# Patient Record
Sex: Female | Born: 1935 | Race: White | Hispanic: No | Smoking: Never smoker
Health system: Southern US, Community
[De-identification: ages and names within clinical notes are randomized; demographics above are authoritative.]

## PROBLEM LIST (undated history)

## (undated) DIAGNOSIS — E119 Type 2 diabetes mellitus without complications: Secondary | ICD-10-CM

## (undated) DIAGNOSIS — M810 Age-related osteoporosis without current pathological fracture: Secondary | ICD-10-CM

## (undated) DIAGNOSIS — R42 Dizziness and giddiness: Secondary | ICD-10-CM

## (undated) DIAGNOSIS — M109 Gout, unspecified: Secondary | ICD-10-CM

## (undated) DIAGNOSIS — D696 Thrombocytopenia, unspecified: Secondary | ICD-10-CM

## (undated) DIAGNOSIS — S42213A Unspecified displaced fracture of surgical neck of unspecified humerus, initial encounter for closed fracture: Secondary | ICD-10-CM

## (undated) DIAGNOSIS — N183 Chronic kidney disease, stage 3 unspecified: Secondary | ICD-10-CM

## (undated) DIAGNOSIS — M47816 Spondylosis without myelopathy or radiculopathy, lumbar region: Secondary | ICD-10-CM

## (undated) DIAGNOSIS — G4730 Sleep apnea, unspecified: Secondary | ICD-10-CM

## (undated) DIAGNOSIS — M1990 Unspecified osteoarthritis, unspecified site: Secondary | ICD-10-CM

## (undated) DIAGNOSIS — G609 Hereditary and idiopathic neuropathy, unspecified: Secondary | ICD-10-CM

## (undated) DIAGNOSIS — E039 Hypothyroidism, unspecified: Secondary | ICD-10-CM

## (undated) DIAGNOSIS — K219 Gastro-esophageal reflux disease without esophagitis: Secondary | ICD-10-CM

## (undated) DIAGNOSIS — I10 Essential (primary) hypertension: Secondary | ICD-10-CM

## (undated) DIAGNOSIS — S02401A Maxillary fracture, unspecified, initial encounter for closed fracture: Secondary | ICD-10-CM

## (undated) DIAGNOSIS — I8390 Asymptomatic varicose veins of unspecified lower extremity: Secondary | ICD-10-CM

## (undated) DIAGNOSIS — E785 Hyperlipidemia, unspecified: Secondary | ICD-10-CM

## (undated) DIAGNOSIS — Z8679 Personal history of other diseases of the circulatory system: Secondary | ICD-10-CM

## (undated) DIAGNOSIS — C50919 Malignant neoplasm of unspecified site of unspecified female breast: Secondary | ICD-10-CM

## (undated) HISTORY — PX: BACK SURGERY: SHX140

## (undated) HISTORY — DX: Chronic kidney disease, stage 3 unspecified: N18.30

## (undated) HISTORY — DX: Unspecified displaced fracture of surgical neck of unspecified humerus, initial encounter for closed fracture: S42.213A

## (undated) HISTORY — DX: Thrombocytopenia, unspecified: D69.6

## (undated) HISTORY — DX: Malignant neoplasm of unspecified site of unspecified female breast: C50.919

## (undated) HISTORY — PX: HERNIA REPAIR: SHX51

## (undated) HISTORY — DX: Personal history of other diseases of the circulatory system: Z86.79

## (undated) HISTORY — PX: DILATION AND CURETTAGE OF UTERUS: SHX78

## (undated) HISTORY — DX: Hyperlipidemia, unspecified: E78.5

## (undated) HISTORY — DX: Hereditary and idiopathic neuropathy, unspecified: G60.9

## (undated) HISTORY — DX: Gout, unspecified: M10.9

## (undated) HISTORY — DX: Chronic kidney disease, stage 3 (moderate): N18.3

## (undated) HISTORY — DX: Hypothyroidism, unspecified: E03.9

## (undated) HISTORY — PX: OTHER SURGICAL HISTORY: SHX169

## (undated) HISTORY — DX: Spondylosis without myelopathy or radiculopathy, lumbar region: M47.816

## (undated) HISTORY — DX: Asymptomatic varicose veins of unspecified lower extremity: I83.90

## (undated) HISTORY — DX: Age-related osteoporosis without current pathological fracture: M81.0

## (undated) HISTORY — PX: VEIN LIGATION AND STRIPPING: SHX2653

## (undated) HISTORY — DX: Maxillary fracture, unspecified side, initial encounter for closed fracture: S02.401A

---
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
## 1987-01-15 HISTORY — PX: HIP FRACTURE SURGERY: SHX118

## 1988-12-14 HISTORY — PX: TOTAL HIP ARTHROPLASTY: SHX124

## 1998-07-11 ENCOUNTER — Other Ambulatory Visit: Admission: RE | Admit: 1998-07-11 | Discharge: 1998-07-11 | Payer: Self-pay | Admitting: Obstetrics and Gynecology

## 1999-08-21 ENCOUNTER — Other Ambulatory Visit: Admission: RE | Admit: 1999-08-21 | Discharge: 1999-08-21 | Payer: Self-pay | Admitting: Obstetrics and Gynecology

## 2000-08-21 ENCOUNTER — Other Ambulatory Visit: Admission: RE | Admit: 2000-08-21 | Discharge: 2000-08-21 | Payer: Self-pay | Admitting: Obstetrics and Gynecology

## 2002-01-20 ENCOUNTER — Ambulatory Visit (HOSPITAL_COMMUNITY): Admission: RE | Admit: 2002-01-20 | Discharge: 2002-01-20 | Payer: Self-pay | Admitting: Gastroenterology

## 2005-11-25 ENCOUNTER — Ambulatory Visit (HOSPITAL_COMMUNITY): Admission: RE | Admit: 2005-11-25 | Discharge: 2005-11-25 | Payer: Self-pay | Admitting: Obstetrics and Gynecology

## 2005-11-25 ENCOUNTER — Encounter (INDEPENDENT_AMBULATORY_CARE_PROVIDER_SITE_OTHER): Payer: Self-pay | Admitting: Specialist

## 2005-12-31 ENCOUNTER — Inpatient Hospital Stay (HOSPITAL_COMMUNITY): Admission: RE | Admit: 2005-12-31 | Discharge: 2006-01-02 | Payer: Self-pay | Admitting: Obstetrics and Gynecology

## 2005-12-31 ENCOUNTER — Encounter (INDEPENDENT_AMBULATORY_CARE_PROVIDER_SITE_OTHER): Payer: Self-pay | Admitting: *Deleted

## 2006-12-03 ENCOUNTER — Encounter: Admission: RE | Admit: 2006-12-03 | Discharge: 2006-12-03 | Payer: Self-pay | Admitting: Cardiology

## 2007-03-20 ENCOUNTER — Encounter: Admission: RE | Admit: 2007-03-20 | Discharge: 2007-03-20 | Payer: Self-pay | Admitting: Family Medicine

## 2007-03-30 HISTORY — PX: COLONOSCOPY W/ POLYPECTOMY: SHX1380

## 2008-03-21 ENCOUNTER — Encounter: Admission: RE | Admit: 2008-03-21 | Discharge: 2008-03-21 | Payer: Self-pay | Admitting: Obstetrics and Gynecology

## 2008-08-10 ENCOUNTER — Encounter: Admission: RE | Admit: 2008-08-10 | Discharge: 2008-08-10 | Payer: Self-pay | Admitting: Sports Medicine

## 2009-05-01 ENCOUNTER — Encounter: Admission: RE | Admit: 2009-05-01 | Discharge: 2009-05-01 | Payer: Self-pay | Admitting: Obstetrics and Gynecology

## 2009-09-13 ENCOUNTER — Encounter: Admission: RE | Admit: 2009-09-13 | Discharge: 2009-09-13 | Payer: Self-pay | Admitting: Gastroenterology

## 2009-09-13 ENCOUNTER — Ambulatory Visit: Payer: Self-pay | Admitting: Internal Medicine

## 2009-09-25 LAB — COMPREHENSIVE METABOLIC PANEL
ALT: 16 U/L (ref 0–35)
AST: 21 U/L (ref 0–37)
Albumin: 4.6 g/dL (ref 3.5–5.2)
Alkaline Phosphatase: 21 U/L — ABNORMAL LOW (ref 39–117)
BUN: 18 mg/dL (ref 6–23)
CO2: 30 mEq/L (ref 19–32)
Potassium: 3.5 mEq/L (ref 3.5–5.3)
Sodium: 142 mEq/L (ref 135–145)

## 2009-09-25 LAB — CBC WITH DIFFERENTIAL/PLATELET
Basophils Absolute: 0 10*3/uL (ref 0.0–0.1)
EOS%: 0.9 % (ref 0.0–7.0)
MCV: 88.2 fL (ref 79.5–101.0)
MONO#: 0.6 10*3/uL (ref 0.1–0.9)
MONO%: 8.7 % (ref 0.0–14.0)
NEUT%: 68 % (ref 38.4–76.8)
Platelets: 102 10*3/uL — ABNORMAL LOW (ref 145–400)
RBC: 4.4 10*6/uL (ref 3.70–5.45)
RDW: 12.5 % (ref 11.2–14.5)

## 2009-09-25 LAB — LACTATE DEHYDROGENASE: LDH: 223 U/L (ref 94–250)

## 2010-01-14 HISTORY — PX: ABDOMINAL HYSTERECTOMY: SHX81

## 2010-05-15 ENCOUNTER — Inpatient Hospital Stay (HOSPITAL_COMMUNITY): Payer: Medicare Other

## 2010-05-15 ENCOUNTER — Inpatient Hospital Stay (HOSPITAL_COMMUNITY)
Admission: RE | Admit: 2010-05-15 | Discharge: 2010-05-16 | DRG: 491 | Disposition: A | Discharge status: Home / Self Care | Payer: Medicare Other | Source: Ambulatory Visit | Attending: Neurosurgery | Admitting: Neurosurgery

## 2010-05-15 DIAGNOSIS — M47817 Spondylosis without myelopathy or radiculopathy, lumbosacral region: Principal | ICD-10-CM | POA: Diagnosis present

## 2010-05-15 DIAGNOSIS — I10 Essential (primary) hypertension: Secondary | ICD-10-CM | POA: Diagnosis present

## 2010-05-15 DIAGNOSIS — E039 Hypothyroidism, unspecified: Secondary | ICD-10-CM | POA: Diagnosis present

## 2010-05-15 LAB — BASIC METABOLIC PANEL
CO2: 31 mEq/L (ref 19–32)
Calcium: 9.7 mg/dL (ref 8.4–10.5)
Chloride: 99 mEq/L (ref 96–112)
GFR calc Af Amer: 58 mL/min — ABNORMAL LOW (ref >60)
GFR calc non Af Amer: 48 mL/min — ABNORMAL LOW (ref >60)
Potassium: 3.2 mEq/L — ABNORMAL LOW (ref 3.5–5.1)

## 2010-05-15 LAB — SURGICAL PCR SCREEN
MRSA, PCR: NEGATIVE
Staphylococcus aureus: NEGATIVE

## 2010-05-15 LAB — URINALYSIS, ROUTINE W REFLEX MICROSCOPIC
Glucose, UA: NEGATIVE mg/dL
Hgb urine dipstick: NEGATIVE
Ketones, ur: NEGATIVE mg/dL
Nitrite: NEGATIVE
Protein, ur: NEGATIVE mg/dL

## 2010-05-15 LAB — CBC
MCH: 29.8 pg (ref 26.0–34.0)
MCHC: 33.9 g/dL (ref 30.0–36.0)
Platelets: 234 10*3/uL (ref 150–400)
RBC: 4.66 MIL/uL (ref 3.87–5.11)
RDW: 12.8 % (ref 11.5–15.5)

## 2010-05-15 LAB — PROTIME-INR: Prothrombin Time: 14 seconds (ref 11.6–15.2)

## 2010-05-15 LAB — URINE MICROSCOPIC-ADD ON

## 2010-05-22 NOTE — Op Note (Signed)
Diane Lucas, Diane Lucas                  ACCOUNT NO.:  [id number]  MEDICAL RECORD NO.:  [id number]           PATIENT TYPE:  O  LOCATION:  3533                         FACILITY:  [facility]  PHYSICIAN:  Clydene Fake, M.D.  DATE OF BIRTH:  1930-10-28  DATE OF PROCEDURE:  05/15/2010 DATE OF DISCHARGE:                              OPERATIVE REPORT   PREOPERATIVE DIAGNOSIS:  Lumbar stenosis, spondylosis, right-sided herniated nucleus pulposus with radiculopathy.  POSTOPERATIVE DIAGNOSIS:  Lumbar stenosis, spondylosis, right-sided herniated nucleus pulposus with radiculopathy, L4-5.  PROCEDURES:  Right-sided decompressive laminectomy decompressing the L4 and L5 roots (two levels), diskectomy, microdissection with microscope.  SURGEON:  Clydene Fake, MD  ASSISTANT:  Hilda Lias, MD  ANESTHESIA:  General endotracheal tube anesthesia.  ESTIMATED BLOOD LOSS:  Minimal.  BLOOD GIVEN:  None.  DRAINS:  None.  COMPLICATIONS:  None.  REASON FOR PROCEDURE:  The patient is a 77 year old woman who has had severe back and right leg pain, numbness, and weakness, tingling on the left side at times.  She has positive straight leg raise on the right with decreased sensation on the right and very slight weakness in EHL on the right.  An MRI was done showing some severe spondylitic changes at 4- 5 level with stenosis and then on the right side a large disk herniation with fragment going caudally.  The patient was brought in for decompressive lam.  PROCEDURE IN DETAIL:  The patient was brought to the operating room and general anesthesia was induced.  The patient was placed in a prone position on the Wilson frame with all pressure points padded.  The patient was prepped and draped in usual sterile fashion.  Site of incision was injected with 20 mL of 1% lidocaine with epinephrine. Needle was placed in the interspace.  X-rays were obtained showing the needle was pointed at the top of the L4  spinous process.  The incision was made centered just below where the needle was.  Incision was taken down the fascia.  Hemostasis was obtained with Bovie cauterization on the right side.  Subperiosteal dissection was done over the L4-5 spinous process lamina to the facet.  Self-retaining retractor was placed. Marker was placed in the interspace.  Another x-ray was obtained confirming our positioning at the L4-5 level.  Microscope was brought in for microdissection.  At this point, high-speed drill was used to start decompressive hemilaminectomy removing the bottom of the L4 lamina, medial facetectomy, and the top of the L5 lamina decompressing the central canal.  Atrophic ligament was removed with Kerrison punches. Extensive laminotomy and along with the lateral margin again decompressing the canal and the lateral recess.  We did foraminotomies over the 4 and 5 roots until we had good decompression of those roots. We then explored the epidural space and found the disk space for large broad-based disk bulge was there and then going caudally from that, a large fragment of disk was up under the 5 nerve root and lateral to the dura.  We used a variety of nerve hooks and pituitary rongeurs, Kerrison punches, and reamer to remove the  fragments of disk.  We did not open the disk space and when we were finished, we had good decompression of the central canal along with good decompression of the 4 and 5 roots and the caudal extrusion of disk was decompressed and removed.  We had good hemostasis.  We irrigated with antibiotic solution.  Retractor was removed and the fascia closed with 0-Vicryl interrupted sutures. Subcutaneous tissue closed with 0, 2-0, and 3-0 Vicryl interrupted sutures.  Dressing was placed.  The patient was placed back in a supine position, awoken from anesthesia, and transferred to the recovery room in a stable condition.          ______________________________ Clydene Fake, M.D.     JRH/MEDQ  D:  05/15/2010  T:  05/16/2010  Job:  [id number]  Electronically Signed by Colon Branch M.D. on 05/22/2010 09:25:51 AM

## 2010-06-01 NOTE — Discharge Summary (Signed)
[name], Diane Lucas                  ACCOUNT NO.:  [id number]   MEDICAL RECORD NO.:  [id number]          PATIENT TYPE:  INP   LOCATION:  1605                         FACILITY:  [facility]   PHYSICIAN:  Malachi Pro. Ambrose Mantle, M.D. DATE OF BIRTH:  [birth year]   DATE OF ADMISSION:  12/31/2005  DATE OF DISCHARGE:  01/02/2006                               DISCHARGE SUMMARY   This is a 77 year old white, married female who was admitted for  abdominal hysterectomy and bilateral salpingo-oophorectomy because of  complex endometrial hyperplasia with atypia. She also was noted to have  thrombocytopenia which she has had for many years. Two units of  platelets were prepared for her prior to surgery.  The patient underwent  abdominal hysterectomy, bilateral salpingo-oophorectomy. At the time of  the laparotomy she was noted to have a left hydrosalpinx very dense  adherence of the left ovary to the left pelvic wall and to the left  mesocolon. Dr. Colin Benton was called to evaluate the bowel and she found no  evidence of any bowel injury. Postoperatively the patient did quite well  and was discharged on the second postoperative day. She remained  afebrile except for one temperature elevation to 100.4 degrees at 4 a.m.  on the second postoperative day.   Preoperative chest x-ray showed no active chest disease. EKG showed a  normal sinus rhythm with possible left atrial enlargement, borderline  EKG.  Pathology report is pending although I have reviewed the slides with Dr.  Renato Battles and there was no evidence of endometrial cancer.   FINAL DIAGNOSES:  1. Complex endometrial hyperplasia with atypia.  2. Thrombocytopenia.  3. Left tubo-ovarian complex with dense adhesions of the left tube and      ovary of the left sigmoid mesocolon and to the left pelvic wall.   ADMISSION LABS:  Hemoglobin 13.1, hematocrit 38.4, white count 6100,  platelet count 70,000. Follow up hemoglobin 11.4, hematocrit 38.8,  platelet  count 83,000, normal differential. Normal comprehensive  metabolic profile except for a glucose of 104 and alkaline phosphatase  of 20. Blood group and type was A positive.  As stated, pathology report final is pending.   The patient is advised to return to the office in 24 hours to have her  staples removed. She is to avoid any heavy lifting or strenuous  activity, call with any unusual problems.      Malachi Pro. Ambrose Mantle, M.D.  Electronically Signed     TFH/MEDQ  D:  01/02/2006  T:  01/02/2006  Job:  [id number]

## 2010-06-01 NOTE — H&P (Signed)
NAME:  Diane Lucas, KOOP                  ACCOUNT NO.:  [id number]   MEDICAL RECORD NO.:  [id number]          PATIENT TYPE:  INP   LOCATION:  NA                           FACILITY:  [facility]   PHYSICIAN:  Malachi Pro. Ambrose Mantle, M.D. DATE OF BIRTH:  [birth year]   DATE OF ADMISSION:  01/01/2003  DATE OF DISCHARGE:                              HISTORY & PHYSICAL   HISTORY OF PRESENT ILLNESS:  This is a 77 year old white married female,  para 2-0-0-2, admitted to the hospital for abdominal hysterectomy and  bilateral salpingo-oophorectomy because of complex endometrial  hyperplasia with atypia.  This patient had postmenopausal bleeding and  underwent an endometrial biopsy that showed benign polyps in 2006.  She  again had some postmenopausal bleeding and underwent a D&C and  hysteroscopy which showed polyps in the uterus, and the pathology report  was endometrial polyp with complex hyperplasia with atypia.  The patient  was advised abdominal hysterectomy and bilateral salpingo-oophorectomy.   PAST MEDICAL HISTORY:  No Latex allergy.  She is allergic to MINOCIN and  ASPIRIN.   OPERATIONS:  1. Vein stripping.  2. Hip replacement  3. Herniorrhaphy.  4. Appendectomy.  5. D&C.   ILLNESSES:  1. Thyroid disease.  2. High blood pressure.  3. High cholesterol.  4. Thrombocytopenia.   SOCIAL HISTORY:  She does not drink, does not smoke.  She is a retired  housewife.   FAMILY HISTORY:  Mother died at 62 of a stroke.  Father died at 74 of a  stroke.  One brother at 12 died of cancer.  Another brother, 11, died of  heart failure.  Another brother at 33 died of heart attack.  Another  brother, 64, died of congestive heart failure.  Another sister, 3, and  a brother, 73, are living.  The brother has a heart condition.  The  patient has 2 children, 33 years old and 107 years old.   REVIEW OF SYSTEMS:  Noncontributory.   MEDICATIONS:  Benazepril/hydrochlorothiazide, Armour Thyroid, Vytorin,  and  Boniva.   PHYSICAL EXAMINATION:  GENERAL:  Well-developed, somewhat obese white  female in no distress.  VITAL SIGNS:  Weight is 180 pounds.  Blood pressure 124/82, pulse 80.  HEAD, EYES, EARS, NOSE, AND THROAT:  Reveal no cranial abnormalities.  Extraocular movements are intact.  Nose and pharynx clear.  NECK: Supple without thyromegaly.  HEART:  Normal size and sounds, no murmurs.  LUNGS: Clear to auscultation.  BREASTS: Not examined.  They have been examined within the last year.  ABDOMEN: Soft and flat, nontender.  No masses are palpable.  There is a  right lower quadrant scar from the appendectomy.  PELVIC:  Vulva and vagina are clean.  Cervix is clean.  Uterus is  anterior and small.  The adnexa are free of masses.  RECTAL:  Exam in March 2007 was negative.   ADMITTING IMPRESSION:  1. Complex endometrial hyperplasia with atypia.  2. Thrombocytopenia.   PLAN:  The patient is admitted for abdominal hysterectomy and bilateral  salpingo-oophorectomy.  She understands the risks of surgery include but  are  not limited to heart attack, stroke, pulmonary embolus, wound  disruption, hemorrhage with need for repeat operation and/or  transfusion, fistula formation, nerve injury, intestinal obstruction.  She understands and agrees to proceed.  The patient is set up for 2  units of platelets if we have bleeding problems.      Malachi Pro. Ambrose Mantle, M.D.  Electronically Signed     TFH/MEDQ  D:  12/30/2005  T:  12/30/2005  Job:  [id number]

## 2010-06-01 NOTE — Op Note (Signed)
Diane Lucas, Diane Lucas                  ACCOUNT NO.:  [id number]   MEDICAL RECORD NO.:  [id number]          PATIENT TYPE:  INP   LOCATION:  X006                         [facility]ACILITY:  [facility]   PHYSICIAN:  Malachi Pro. Ambrose Mantle, M.D. DATE OF BIRTH:  12-09-1930   DATE OF PROCEDURE:  12/31/2005  DATE OF DISCHARGE:                               OPERATIVE REPORT   PREOPERATIVE DIAGNOSIS:  Complex endometrial hyperplasia with atypia.   POSTOPERATIVE DIAGNOSIS:  Complex endometrial hyperplasia with atypia  with left tubo-ovarian complex.   OPERATION:  1. Abdominal hysterectomy.  2. Bilateral salpingo-oophorectomy.  3. Lysis of adhesions.   CONSULT:  Dr. Colin Benton to confirm there was no bowel injury.   OPERATOR:  Dr. Ambrose Mantle   ASSISTANT:  Dr. Ellyn Hack.   CONSULTANT:  Dr. Colin Benton   The patient was brought to the operating room and placed under  satisfactory general anesthesia.  The abdomen was prepped with Betadine  solution.  The vagina and urethra were prepped with Betadine solution.  I initially tried a 16 Foley catheter.  It would not go in the urethra,  so I used a 12 Foley catheter.  This was hooked to straight drain.  The  patient was placed supine.  The abdomen was draped as a sterile field.  A transverse incision was made and carried in layers through the skin,  subcutaneous tissue, and fascia.  There was scarring in the right mid  heart of the incision secondary to her prior appendectomy.  The fascia  was incised transversely, separated from the rectus muscles superiorly  and inferiorly, and then the rectus muscle was split in the midline.  The peritoneum was opened vertically.  The upper abdomen was explored.  There were adhesions along the right lateral mid pelvis.  The liver felt  smooth.  The gallbladder was smooth, firmly cystic; no stones were felt.  The inferior pole of both kidneys was felt.  No upper abdominal  abnormalities were noted.  The Balfour retractor was used.  Packs were  used to pack the bowel out of the way and repair the operative field,  and it was apparent that we had the left adnexa very densely adherent to  the sigmoid mesocolon.  I attempted to dissect some of these adhesions  but elected to proceed with abdominal hysterectomy and right salpingo-  oophorectomy and come back after that procedure to complete the left  salpingo-oophorectomy.  I placed clamps across the upper pedicles of the  uterus, dissected both round ligaments by dividing them with the Bovie,  creating a bladder flap, and then divided the right infundibulopelvic  ligament between 2 clamps and doubly suture ligating the  infundibulopelvic ligament.  The bladder flap was completed and then the  left upper pedicles were divided between 2 clamps.  I skeletonized the  uterine vessels on both sides, clamped, cut, and suture ligated and then  progressively clamped below the uterine vessels until the right vaginal  angle was entered.  I removed the uterus by transecting the upper  vagina, and then a left vaginal angle suture was placed, and  the central  portion of the vagina was closed with interrupted figure-of-eight  sutures of 0 Vicryl.  The uterosacral ligaments were then sutured  together in the midline.  The left tube and ovary were evaluated.  The  left hydrosalpinx that had been present actually ruptured during the  dissection.  I tried to identify the left infundibulopelvic ligament,  was able to do so, and divided it between clamps and doubly suture  ligated it.  It was then apparent that the left ovary was densely  adherent along with the distal part of the left tube in the pelvis in  the mesocolon.  I was able to dissect this free using primarily the  electrical current and then removed the left tube and ovary.  In pulling  up on the left ovary that was firmly adherent in the pelvic sidewall in  the mesocolon, brownish material came out.  So I sent this specimen for  frozen  section, and Dr. Quentin Ore called back and said that it was indeed  the left ovary with old blood from corpora albicans.  Because this was  very close to the colon, I asked Dr. Colin Benton to come in.  She came in and  confirmed there was no bowel injury.  There was some bleeding from the  mesocolon that was controlled with Bovie, and then I placed Surgicel  across this area and placed pressure on it, and there was no bleeding.  All packs and retractors were then removed after hemostasis was  confirmed.  The abdominal wall was closed with running suture of 0  Vicryl on the peritoneum, 2 running sutures of 0 Vicryl on the fascia,  running 3-0 Vicryl on the subcu tissue, and staples on the skin.  The  patient seemed to tolerate the procedure well, although it seemed like  she did bleed more than the average person, possibly related to her  platelet count of 70,000.  We did not use any platelets, but we had 2  units of platelets on hold in case they were necessary.  The patient was  returned to recovery in satisfactory condition.      Malachi Pro. Ambrose Mantle, M.D.  Electronically Signed     TFH/MEDQ  D:  12/31/2005  T:  12/31/2005  Job:  [id number]   cc:   Alfonse Ras, MD  [address], [address]  [place]  [place] [address]

## 2010-06-01 NOTE — Op Note (Signed)
   NAME:  Diane Lucas, Diane Lucas                            ACCOUNT NO.:  [id number]   MEDICAL RECORD NO.:  [id number]                   PATIENT TYPE:  AMB   LOCATION:  ENDO                                 FACILITY:  [facility]   PHYSICIAN:  Anselmo Rod, M.D.               DATE OF BIRTH:  [birth year]   DATE OF PROCEDURE:  01/20/2002  DATE OF DISCHARGE:                                 OPERATIVE REPORT   PROCEDURE:  Colonoscopy.   ENDOSCOPIST:  Anselmo Rod, M.D.   PREPROCEDURE PREP:  The patient was prepped with a bottle of magnesium  citrate and a gallon of golytely the night prior to the procedure.  She was  also fasted for 8 hours prior to the procedure.  Informed consent was  procured after the risk and benefits of the procedure were discussed with  her.   PREPROCEDURE PHYSICAL:  Patient had stable vital signs.  Abdomen was soft  and non tender with no HSM.   DESCRIPTION OF PROCEDURE:  The patient was placed in the left lateral  decubitus position and sedated with 50 mg of Demerol and 5 mg of Versed  intravenously.  Once the patient was adequately sedate and maintained on low-  flow oxygen and continuous cardiac monitoring, the Olympus video colonoscope  was advanced from the rectum to the cecum without difficulty.  There were a  few left-sided diverticula seen.  Small internal hemorrhoids were seen on  retroflexion.  No other abnormalities were noted.  The procedure was  complete up to the cecum.  The appendiceal orifice and the ileocecal valve  were clearly visualized and photographed.   IMPRESSION:  1. Left-sided diverticula.  2. Small, nonbleeding internal hemorrhoids.  3. No masses or polyps present.   RECOMMENDATIONS:  1. Repeat colorectal cancer screening is recommended in the next 10 years     unless the patient develops any  abnormal symptoms in the interim.  2. A high-fiber diet has been discussed with her in great detail, and     brochures have been given to her for her  education.  3. Outpatient follow-up on a p.r.n. basis.                                               Anselmo Rod, M.D.    JNM/MEDQ  D:  01/20/2002  T:  01/21/2002  Job:  [id number]   cc:   Tammy R. Collins [place], M.D.  [address]  [place]  [place] [address]  Fax: [phone]

## 2010-06-01 NOTE — Op Note (Signed)
NAME:  Diane Lucas, Diane Lucas                  ACCOUNT NO.:  [id number]   MEDICAL RECORD NO.:  [id number]          PATIENT TYPE:  AMB   LOCATION:  DAY                          FACILITY:  [facility]   PHYSICIAN:  Malachi Pro. Ambrose Mantle, M.D. DATE OF BIRTH:  March 07, 1930   DATE OF PROCEDURE:  11/25/2005  DATE OF DISCHARGE:                                 OPERATIVE REPORT   PREOPERATIVE DIAGNOSIS:  Postmenopausal bleeding and probable endometrial  polyp.   POSTOPERATIVE DIAGNOSIS:  Postmenopausal bleeding and probable endometrial  polyp.   OPERATION:  D&C, hysteroscopic removal of endometrial polyp.   OPERATOR:  Malachi Pro. Ambrose Mantle, M.D.   ANESTHESIA:  Mask anesthesia.   The patient was brought to the operating room and placed under satisfactory  mask anesthesia.  She was placed in lithotomy position prior to giving  anesthesia to make sure that the position did not cause undue stress on her  hip.  Exam revealed the uterus to be midplane, small. The adnexa were free  of masses.  The cervix was prepped with Betadine solution and draped as a  sterile field.  A bib was placed under the buttocks to collect all the  sorbitol.  The cervix was drawn into the operative field.  The cervical  opening was quite small.  The uterus sounded to a little less than 3 inches  anteriorly was then dilated up to 27 Pratt dilator.  Hysteroscope was used  to identify a large polyp coming from the anterior wall of the uterus.  I  removed the hysteroscope and used the polyp forceps to pull the endometrial  polyp in several fragments out.  then replaced the hysteroscope,used a  biopsy forceps to remove small fragments of endometrial tissue and concluded  the procedure by scraping the endocervix and then the endometrial cavity and  preserving those tissues.  Procedure was terminated.  The deficit was about  180 mL.  There was a significant amount of sorbitol on the floor.  The  patient seemed to tolerate the procedure well and was  returned to recovery  in satisfactory condition.      Malachi Pro. Ambrose Mantle, M.D.  Electronically Signed     TFH/MEDQ  D:  11/25/2005  T:  11/25/2005  Job:  [id number]

## 2010-06-01 NOTE — Op Note (Signed)
Diane Lucas, Diane Lucas                  ACCOUNT NO.:  [id number]   MEDICAL RECORD NO.:  [id number]          PATIENT TYPE:  INP   LOCATION:  J478                         [facility]ACILITY[facility]   PHYSICIAN:  Alfonse Ras, MD   DATE OF BIRTH:  October 07, 1930   DATE OF PROCEDURE:  12/31/2005  DATE OF DISCHARGE:                               OPERATIVE REPORT   Intraoperative consultation requested by Dr. Hilbert Bible for evaluation  of the rectum and sigmoid colon.   FINAL DIAGNOSIS:  No evidence of injury to the rectum or sigmoid colon.   PROCEDURE:  Exploratory laparotomy.   SURGEON:  Baruch Merl, MD.   DESCRIPTION:  I was called by Dr. Ambrose Mantle after he completed a total  abdominal hysterectomy and felt like he possibly got into a diverticulum  of the sigmoid colon versus rectum.  Apparently frozen pathology was  more consistent with old blood from an endometrial source.  I inspected  the rectum and the sigmoid extensively, and there was some small amount  of tearing of the mesentery but no evidence of serosal injury at all to  the rectum or sigmoid colon.  I then exited in the room, and the  remainder of the dictation will be dictated by Dr. Hilbert Bible.      Alfonse Ras, MD  Electronically Signed     KRE/MEDQ  D:  12/31/2005  T:  12/31/2005  Job:  [id number]

## 2010-11-28 ENCOUNTER — Other Ambulatory Visit: Payer: Self-pay | Admitting: Sports Medicine

## 2010-11-28 ENCOUNTER — Ambulatory Visit
Admission: RE | Admit: 2010-11-28 | Discharge: 2010-11-28 | Disposition: A | Discharge status: Home / Self Care | Payer: Medicare Other | Source: Ambulatory Visit | Attending: Sports Medicine | Admitting: Sports Medicine

## 2010-11-28 DIAGNOSIS — M25551 Pain in right hip: Secondary | ICD-10-CM

## 2011-05-05 ENCOUNTER — Other Ambulatory Visit: Payer: Self-pay | Admitting: Orthopedic Surgery

## 2011-05-05 MED ORDER — BUPIVACAINE LIPOSOME 1.3 % IJ SUSP: 20.0000 mL | Freq: Once | INTRAMUSCULAR | Status: DC

## 2011-05-05 MED ORDER — DEXAMETHASONE SODIUM PHOSPHATE 10 MG/ML IJ SOLN: 10.0000 mg | Freq: Once | INTRAMUSCULAR | Status: DC

## 2011-06-13 ENCOUNTER — Ambulatory Visit
Admission: RE | Admit: 2011-06-13 | Discharge: 2011-06-13 | Disposition: A | Discharge status: Home / Self Care | Payer: Medicare Other | Source: Ambulatory Visit | Attending: Physician Assistant | Admitting: Physician Assistant

## 2011-06-13 ENCOUNTER — Other Ambulatory Visit: Payer: Self-pay | Admitting: Physician Assistant

## 2011-06-13 DIAGNOSIS — Z01818 Encounter for other preprocedural examination: Secondary | ICD-10-CM

## 2011-07-02 ENCOUNTER — Encounter (HOSPITAL_COMMUNITY): Payer: Self-pay | Admitting: Pharmacy Technician

## 2011-07-04 ENCOUNTER — Encounter (HOSPITAL_COMMUNITY)
Admission: RE | Admit: 2011-07-04 | Discharge: 2011-07-04 | Disposition: A | Discharge status: Home / Self Care | Payer: Medicare Other | Source: Ambulatory Visit | Attending: Orthopedic Surgery | Admitting: Orthopedic Surgery

## 2011-07-04 ENCOUNTER — Encounter (HOSPITAL_COMMUNITY): Payer: Self-pay

## 2011-07-04 HISTORY — DX: Essential (primary) hypertension: I10

## 2011-07-04 HISTORY — DX: Dizziness and giddiness: R42

## 2011-07-04 HISTORY — DX: Gastro-esophageal reflux disease without esophagitis: K21.9

## 2011-07-04 HISTORY — DX: Unspecified osteoarthritis, unspecified site: M19.90

## 2011-07-04 HISTORY — DX: Sleep apnea, unspecified: G47.30

## 2011-07-04 LAB — PROTIME-INR
INR: 0.99 (ref 0.00–1.49)
Prothrombin Time: 13.3 seconds (ref 11.6–15.2)

## 2011-07-04 LAB — CBC
HCT: 40.5 % (ref 36.0–46.0)
MCHC: 32.3 g/dL (ref 30.0–36.0)
MCV: 89.8 fL (ref 78.0–100.0)
Platelets: 121 10*3/uL — ABNORMAL LOW (ref 150–400)
RDW: 12.5 % (ref 11.5–15.5)

## 2011-07-04 LAB — URINALYSIS, ROUTINE W REFLEX MICROSCOPIC
Leukocytes, UA: NEGATIVE
Nitrite: NEGATIVE
Specific Gravity, Urine: 1.016 (ref 1.005–1.030)
Urobilinogen, UA: 0.2 mg/dL (ref 0.0–1.0)

## 2011-07-04 LAB — COMPREHENSIVE METABOLIC PANEL
Albumin: 4.2 g/dL (ref 3.5–5.2)
BUN: 22 mg/dL (ref 6–23)
Creatinine, Ser: 0.92 mg/dL (ref 0.50–1.10)
Potassium: 3.7 mEq/L (ref 3.5–5.1)
Total Protein: 8.1 g/dL (ref 6.0–8.3)

## 2011-07-04 LAB — SURGICAL PCR SCREEN: MRSA, PCR: NEGATIVE

## 2011-07-04 LAB — APTT: aPTT: 34 seconds (ref 24–37)

## 2011-07-04 MED ORDER — CHLORHEXIDINE GLUCONATE 4 % EX LIQD
60.0000 mL | Freq: Once | CUTANEOUS | Status: DC
  Filled 2011-07-04: qty 60

## 2011-07-04 NOTE — Patient Instructions (Signed)
20 Arnetra S Wery  07/04/2011   Your procedure is scheduled on:  07/12/11  Report to SHORT STAY DEPT  at 12:15 PM AM.  Call this number if you have problems the morning of surgery: [phone]   Remember:   Do not eat food or drink liquids AFTER MIDNIGHT  May have clear liquids UNTIL 6 HOURS BEFORE SURGERY (8:45 AM)  Clear liquids include soda, tea, black coffee, apple or grape juice, broth.  Take these medicines the morning of surgery with A SIP OF WATER:  LEVOTHYROXINE / OMEPRAZOLE   Do not wear jewelry, make-up or nail polish.  Do not wear lotions, powders, or perfumes.   Do not shave legs or underarms 48 hrs. before surgery (men may shave face)  Do not bring valuables to the hospital.  Contacts, dentures or bridgework may not be worn into surgery.  Leave suitcase in the car. After surgery it may be brought to your room.  For patients admitted to the hospital, checkout time is 11:00 AM the day of discharge.   Patients discharged the day of surgery will not be allowed to drive home.    Special Instructions:   Please read over the following fact sheets that you were given: MRSA  Information / Incentive Spirometer               SHOWER WITH BETASEPT THE NIGHT BEFORE SURGERY AND THE MORNING OF SURGERY               STOP ASPIRIN /  SUPPLEMENTS / ADVIL/MOTRIN / ALEVE 7 DAYS PREOP

## 2011-07-04 NOTE — Pre-Procedure Instructions (Signed)
CBC faxed to Dr. Lequita Halt in reference to decreased platelets Confirmation recieved

## 2011-07-04 NOTE — Progress Notes (Signed)
07/04/11 1230  OBSTRUCTIVE SLEEP APNEA  Have you ever been diagnosed with sleep apnea through a sleep study? No  Do you snore loudly (loud enough to be heard through closed doors)?  1  Do you often feel tired, fatigued, or sleepy during the daytime? 1  Has anyone observed you stop breathing during your sleep? 0  Do you have, or are you being treated for high blood pressure? 1  BMI more than 35 kg/m2? 0  Age over 76 years old? 1  Neck circumference greater than 40 cm/18 inches? 0  Gender: 0  Obstructive Sleep Apnea Score 4   Score 4 or greater  Updated health history;Results sent to PCP

## 2011-07-12 ENCOUNTER — Encounter (HOSPITAL_COMMUNITY): Payer: Self-pay | Admitting: *Deleted

## 2011-07-12 ENCOUNTER — Ambulatory Visit (HOSPITAL_COMMUNITY): Payer: Medicare Other | Admitting: Anesthesiology

## 2011-07-12 ENCOUNTER — Encounter (HOSPITAL_COMMUNITY): Payer: Self-pay | Admitting: Anesthesiology

## 2011-07-12 ENCOUNTER — Ambulatory Visit (HOSPITAL_COMMUNITY): Payer: Medicare Other

## 2011-07-12 ENCOUNTER — Encounter (HOSPITAL_COMMUNITY)
Admission: RE | Disposition: A | Discharge status: Home Health | Payer: Self-pay | Source: Ambulatory Visit | Attending: Orthopedic Surgery

## 2011-07-12 ENCOUNTER — Inpatient Hospital Stay (HOSPITAL_COMMUNITY)
Admission: RE | Admit: 2011-07-12 | Discharge: 2011-07-15 | DRG: 467 | Disposition: A | Discharge status: Home Health | Payer: Medicare Other | Source: Ambulatory Visit | Attending: Orthopedic Surgery | Admitting: Orthopedic Surgery

## 2011-07-12 ENCOUNTER — Other Ambulatory Visit: Payer: Self-pay | Admitting: Orthopedic Surgery

## 2011-07-12 DIAGNOSIS — M169 Osteoarthritis of hip, unspecified: Secondary | ICD-10-CM | POA: Diagnosis present

## 2011-07-12 DIAGNOSIS — M1610 Unilateral primary osteoarthritis, unspecified hip: Secondary | ICD-10-CM | POA: Diagnosis present

## 2011-07-12 DIAGNOSIS — T84018A Broken internal joint prosthesis, other site, initial encounter: Secondary | ICD-10-CM

## 2011-07-12 DIAGNOSIS — Z01812 Encounter for preprocedural laboratory examination: Secondary | ICD-10-CM

## 2011-07-12 DIAGNOSIS — D62 Acute posthemorrhagic anemia: Secondary | ICD-10-CM | POA: Diagnosis not present

## 2011-07-12 DIAGNOSIS — T84069A Wear of articular bearing surface of unspecified internal prosthetic joint, initial encounter: Principal | ICD-10-CM | POA: Diagnosis present

## 2011-07-12 DIAGNOSIS — I10 Essential (primary) hypertension: Secondary | ICD-10-CM | POA: Diagnosis present

## 2011-07-12 DIAGNOSIS — K219 Gastro-esophageal reflux disease without esophagitis: Secondary | ICD-10-CM | POA: Diagnosis present

## 2011-07-12 DIAGNOSIS — G4730 Sleep apnea, unspecified: Secondary | ICD-10-CM | POA: Diagnosis present

## 2011-07-12 DIAGNOSIS — Y831 Surgical operation with implant of artificial internal device as the cause of abnormal reaction of the patient, or of later complication, without mention of misadventure at the time of the procedure: Secondary | ICD-10-CM | POA: Diagnosis present

## 2011-07-12 DIAGNOSIS — Z96649 Presence of unspecified artificial hip joint: Secondary | ICD-10-CM

## 2011-07-12 LAB — TYPE AND SCREEN
ABO/RH(D): A POS
Antibody Screen: NEGATIVE

## 2011-07-12 SURGERY — REVISION, TOTAL ARTHROPLASTY, HIP, ACETABULAR COMPONENT
Anesthesia: Spinal | Site: Hip | Laterality: Right | Wound class: Clean

## 2011-07-12 MED ORDER — SIMVASTATIN 40 MG PO TABS
40.0000 mg | ORAL_TABLET | Freq: Every day | ORAL | Status: DC
  Administered 2011-07-12 – 2011-07-14 (×3): 40 mg via ORAL
  Filled 2011-07-12 (×4): qty 1

## 2011-07-12 MED ORDER — BISACODYL 10 MG RE SUPP: 10.0000 mg | Freq: Every day | RECTAL | Status: DC | PRN

## 2011-07-12 MED ORDER — ACETAMINOPHEN 10 MG/ML IV SOLN
INTRAVENOUS | Status: DC | PRN
  Administered 2011-07-12: 1000 mg via INTRAVENOUS

## 2011-07-12 MED ORDER — FLEET ENEMA 7-19 GM/118ML RE ENEM: 1.0000 | ENEMA | Freq: Once | RECTAL | Status: AC | PRN

## 2011-07-12 MED ORDER — STERILE WATER FOR IRRIGATION IR SOLN
Status: DC | PRN
  Administered 2011-07-12: 1500 mL

## 2011-07-12 MED ORDER — DIPHENHYDRAMINE HCL 12.5 MG/5ML PO ELIX: 12.5000 mg | ORAL_SOLUTION | ORAL | Status: DC | PRN

## 2011-07-12 MED ORDER — BUPIVACAINE LIPOSOME 1.3 % IJ SUSP
20.0000 mL | Freq: Once | INTRAMUSCULAR | Status: AC
  Administered 2011-07-12: 20 mL
  Filled 2011-07-12: qty 20

## 2011-07-12 MED ORDER — HYDROMORPHONE HCL PF 1 MG/ML IJ SOLN: 0.2500 mg | INTRAMUSCULAR | Status: DC | PRN

## 2011-07-12 MED ORDER — CEFAZOLIN SODIUM-DEXTROSE 2-3 GM-% IV SOLR
2.0000 g | INTRAVENOUS | Status: AC
Start: 2011-07-12 — End: 2011-07-12
  Administered 2011-07-12: 2 g via INTRAVENOUS

## 2011-07-12 MED ORDER — DEXAMETHASONE SODIUM PHOSPHATE 10 MG/ML IJ SOLN
INTRAMUSCULAR | Status: DC | PRN
  Administered 2011-07-12: 5 mg via INTRAVENOUS

## 2011-07-12 MED ORDER — PHENYLEPHRINE HCL 10 MG/ML IJ SOLN
10.0000 mg | INTRAVENOUS | Status: DC | PRN
  Administered 2011-07-12: 50 ug/min via INTRAVENOUS

## 2011-07-12 MED ORDER — OXYCODONE HCL 5 MG PO TABS
5.0000 mg | ORAL_TABLET | ORAL | Status: DC | PRN
  Administered 2011-07-13 – 2011-07-14 (×3): 5 mg via ORAL
  Filled 2011-07-12: qty 1
  Filled 2011-07-12: qty 2
  Filled 2011-07-12 (×2): qty 1

## 2011-07-12 MED ORDER — PHENOL 1.4 % MT LIQD: 1.0000 | OROMUCOSAL | Status: DC | PRN

## 2011-07-12 MED ORDER — PANTOPRAZOLE SODIUM 40 MG PO TBEC
40.0000 mg | DELAYED_RELEASE_TABLET | Freq: Every day | ORAL | Status: DC
  Administered 2011-07-12 – 2011-07-14 (×3): 40 mg via ORAL
  Filled 2011-07-12 (×4): qty 1

## 2011-07-12 MED ORDER — ONDANSETRON HCL 4 MG/2ML IJ SOLN
INTRAMUSCULAR | Status: DC | PRN
  Administered 2011-07-12 (×2): 2 mg via INTRAVENOUS

## 2011-07-12 MED ORDER — PROMETHAZINE HCL 25 MG/ML IJ SOLN: 6.2500 mg | INTRAMUSCULAR | Status: DC | PRN

## 2011-07-12 MED ORDER — TRAMADOL HCL 50 MG PO TABS: 50.0000 mg | ORAL_TABLET | Freq: Four times a day (QID) | ORAL | Status: DC | PRN

## 2011-07-12 MED ORDER — BUPIVACAINE HCL (PF) 0.5 % IJ SOLN
INTRAMUSCULAR | Status: AC
  Filled 2011-07-12: qty 30

## 2011-07-12 MED ORDER — ACETAMINOPHEN 10 MG/ML IV SOLN: 1000.0000 mg | Freq: Once | INTRAVENOUS | Status: DC

## 2011-07-12 MED ORDER — SODIUM CHLORIDE 0.9 % IJ SOLN
INTRAMUSCULAR | Status: DC | PRN
  Administered 2011-07-12: 50 mL

## 2011-07-12 MED ORDER — ACETAMINOPHEN 10 MG/ML IV SOLN
INTRAVENOUS | Status: AC
  Filled 2011-07-12: qty 100

## 2011-07-12 MED ORDER — FENTANYL CITRATE 0.05 MG/ML IJ SOLN
INTRAMUSCULAR | Status: DC | PRN
  Administered 2011-07-12: 50 ug via INTRAVENOUS

## 2011-07-12 MED ORDER — METHOCARBAMOL 500 MG PO TABS
500.0000 mg | ORAL_TABLET | Freq: Four times a day (QID) | ORAL | Status: DC | PRN
  Administered 2011-07-13 – 2011-07-14 (×3): 500 mg via ORAL
  Filled 2011-07-12 (×3): qty 1

## 2011-07-12 MED ORDER — LEVOTHYROXINE SODIUM 50 MCG PO TABS
50.0000 ug | ORAL_TABLET | Freq: Every day | ORAL | Status: DC
  Administered 2011-07-13 – 2011-07-15 (×3): 50 ug via ORAL
  Filled 2011-07-12 (×4): qty 1

## 2011-07-12 MED ORDER — ONDANSETRON HCL 4 MG PO TABS: 4.0000 mg | ORAL_TABLET | Freq: Four times a day (QID) | ORAL | Status: DC | PRN

## 2011-07-12 MED ORDER — PROPOFOL 10 MG/ML IV EMUL
INTRAVENOUS | Status: DC | PRN
  Administered 2011-07-12: 50 ug/kg/min via INTRAVENOUS

## 2011-07-12 MED ORDER — METOCLOPRAMIDE HCL 5 MG/ML IJ SOLN: 5.0000 mg | Freq: Three times a day (TID) | INTRAMUSCULAR | Status: DC | PRN

## 2011-07-12 MED ORDER — ONDANSETRON HCL 4 MG/2ML IJ SOLN: 4.0000 mg | Freq: Four times a day (QID) | INTRAMUSCULAR | Status: DC | PRN

## 2011-07-12 MED ORDER — LACTATED RINGERS IV SOLN: INTRAVENOUS | Status: DC

## 2011-07-12 MED ORDER — MORPHINE SULFATE 2 MG/ML IJ SOLN: 1.0000 mg | INTRAMUSCULAR | Status: DC | PRN

## 2011-07-12 MED ORDER — ACETAMINOPHEN 650 MG RE SUPP: 650.0000 mg | Freq: Four times a day (QID) | RECTAL | Status: DC | PRN

## 2011-07-12 MED ORDER — MEPERIDINE HCL 50 MG/ML IJ SOLN
6.2500 mg | INTRAMUSCULAR | Status: DC | PRN
  Administered 2011-07-12 (×2): 6.25 mg via INTRAVENOUS

## 2011-07-12 MED ORDER — ACETAMINOPHEN 10 MG/ML IV SOLN
1000.0000 mg | Freq: Four times a day (QID) | INTRAVENOUS | Status: AC
  Administered 2011-07-12 – 2011-07-13 (×4): 1000 mg via INTRAVENOUS
  Filled 2011-07-12 (×7): qty 100

## 2011-07-12 MED ORDER — POLYETHYLENE GLYCOL 3350 17 G PO PACK: 17.0000 g | PACK | Freq: Every day | ORAL | Status: DC | PRN

## 2011-07-12 MED ORDER — RIVAROXABAN 10 MG PO TABS
10.0000 mg | ORAL_TABLET | Freq: Every day | ORAL | Status: DC
  Administered 2011-07-13 – 2011-07-15 (×3): 10 mg via ORAL
  Filled 2011-07-12 (×4): qty 1

## 2011-07-12 MED ORDER — HYDROCHLOROTHIAZIDE 25 MG PO TABS
25.0000 mg | ORAL_TABLET | Freq: Every day | ORAL | Status: DC
  Administered 2011-07-13 – 2011-07-15 (×3): 25 mg via ORAL
  Filled 2011-07-12 (×4): qty 1

## 2011-07-12 MED ORDER — BUPIVACAINE HCL (PF) 0.5 % IJ SOLN
INTRAMUSCULAR | Status: DC | PRN
  Administered 2011-07-12: 3 mL

## 2011-07-12 MED ORDER — CEFAZOLIN SODIUM-DEXTROSE 2-3 GM-% IV SOLR
INTRAVENOUS | Status: AC
  Filled 2011-07-12: qty 50

## 2011-07-12 MED ORDER — CEFAZOLIN SODIUM 1-5 GM-% IV SOLN
1.0000 g | Freq: Four times a day (QID) | INTRAVENOUS | Status: AC
  Administered 2011-07-12 – 2011-07-13 (×2): 1 g via INTRAVENOUS
  Filled 2011-07-12 (×2): qty 50

## 2011-07-12 MED ORDER — SODIUM CHLORIDE 0.9 % IV SOLN: INTRAVENOUS | Status: DC

## 2011-07-12 MED ORDER — LACTATED RINGERS IV SOLN
INTRAVENOUS | Status: DC
  Administered 2011-07-12: 1000 mL via INTRAVENOUS
  Administered 2011-07-12: 16:00:00 via INTRAVENOUS
  Administered 2011-07-12: 1000 mL via INTRAVENOUS

## 2011-07-12 MED ORDER — POTASSIUM CHLORIDE IN NACL 20-0.9 MEQ/L-% IV SOLN
INTRAVENOUS | Status: DC
  Administered 2011-07-12 – 2011-07-13 (×3): via INTRAVENOUS
  Filled 2011-07-12 (×6): qty 1000

## 2011-07-12 MED ORDER — DOCUSATE SODIUM 100 MG PO CAPS
100.0000 mg | ORAL_CAPSULE | Freq: Two times a day (BID) | ORAL | Status: DC
  Administered 2011-07-12 – 2011-07-14 (×5): 100 mg via ORAL

## 2011-07-12 MED ORDER — ACETAMINOPHEN 325 MG PO TABS
650.0000 mg | ORAL_TABLET | Freq: Four times a day (QID) | ORAL | Status: DC | PRN
  Administered 2011-07-14: 650 mg via ORAL
  Filled 2011-07-12: qty 2

## 2011-07-12 MED ORDER — EPHEDRINE SULFATE 50 MG/ML IJ SOLN
INTRAMUSCULAR | Status: DC | PRN
  Administered 2011-07-12: 5 mg via INTRAVENOUS

## 2011-07-12 MED ORDER — MENTHOL 3 MG MT LOZG: 1.0000 | LOZENGE | OROMUCOSAL | Status: DC | PRN

## 2011-07-12 MED ORDER — 0.9 % SODIUM CHLORIDE (POUR BTL) OPTIME
TOPICAL | Status: DC | PRN
  Administered 2011-07-12: 1000 mL

## 2011-07-12 MED ORDER — METOCLOPRAMIDE HCL 10 MG PO TABS: 5.0000 mg | ORAL_TABLET | Freq: Three times a day (TID) | ORAL | Status: DC | PRN

## 2011-07-12 MED ORDER — MEPERIDINE HCL 50 MG/ML IJ SOLN
INTRAMUSCULAR | Status: AC
  Filled 2011-07-12: qty 1

## 2011-07-12 MED ORDER — METHOCARBAMOL 100 MG/ML IJ SOLN: 500.0000 mg | Freq: Four times a day (QID) | INTRAVENOUS | Status: DC | PRN

## 2011-07-12 SURGICAL SUPPLY — 59 items
BAG SPEC THK2 15X12 ZIP CLS (MISCELLANEOUS) ×3
BAG ZIPLOCK 12X15 (MISCELLANEOUS) ×6 IMPLANT
BIT DRILL 2.8X128 (BIT) ×2 IMPLANT
BLADE EXTENDED COATED 6.5IN (ELECTRODE) ×2 IMPLANT
BLADE SAW SAG 73X25 THK (BLADE) ×1
BLADE SAW SGTL 73X25 THK (BLADE) ×1 IMPLANT
CATH KIT ON-Q SILVERSOAK 5 (CATHETERS) ×1 IMPLANT
CATH KIT ON-Q SILVERSOAK 5IN (CATHETERS) ×2 IMPLANT
CLOSURE STERI-STRIP 1/4X4 (GAUZE/BANDAGES/DRESSINGS) ×1 IMPLANT
CLOTH BEACON ORANGE TIMEOUT ST (SAFETY) ×2 IMPLANT
CONT SPECI 4OZ STER CLIK (MISCELLANEOUS) IMPLANT
DRAPE INCISE IOBAN 66X45 STRL (DRAPES) ×2 IMPLANT
DRAPE ORTHO SPLIT 77X108 STRL (DRAPES) ×4
DRAPE POUCH INSTRU U-SHP 10X18 (DRAPES) ×2 IMPLANT
DRAPE SURG ORHT 6 SPLT 77X108 (DRAPES) ×2 IMPLANT
DRAPE U-SHAPE 47X51 STRL (DRAPES) ×2 IMPLANT
DRSG ADAPTIC 3X8 NADH LF (GAUZE/BANDAGES/DRESSINGS) ×1 IMPLANT
DRSG EMULSION OIL 3X16 NADH (GAUZE/BANDAGES/DRESSINGS) ×2 IMPLANT
DRSG MEPILEX BORDER 4X4 (GAUZE/BANDAGES/DRESSINGS) ×4 IMPLANT
DRSG MEPILEX BORDER 4X8 (GAUZE/BANDAGES/DRESSINGS) ×2 IMPLANT
DURAPREP 26ML APPLICATOR (WOUND CARE) ×2 IMPLANT
ELECT REM PT RETURN 9FT ADLT (ELECTROSURGICAL) ×2
ELECTRODE REM PT RTRN 9FT ADLT (ELECTROSURGICAL) ×1 IMPLANT
EVACUATOR 1/8 PVC DRAIN (DRAIN) ×2 IMPLANT
FACESHIELD LNG OPTICON STERILE (SAFETY) ×8 IMPLANT
GAUZE XEROFORM 4X4 STRL (GAUZE/BANDAGES/DRESSINGS) ×1 IMPLANT
GLOVE BIO SURGEON STRL SZ7.5 (GLOVE) ×2 IMPLANT
GLOVE BIO SURGEON STRL SZ8 (GLOVE) ×2 IMPLANT
GLOVE BIOGEL PI IND STRL 8 (GLOVE) ×2 IMPLANT
GLOVE BIOGEL PI INDICATOR 8 (GLOVE) ×2
GOWN STRL NON-REIN LRG LVL3 (GOWN DISPOSABLE) ×2 IMPLANT
GOWN STRL REIN XL XLG (GOWN DISPOSABLE) ×2 IMPLANT
IMMOBILIZER KNEE 20 (SOFTGOODS) ×4
IMMOBILIZER KNEE 20 THIGH 36 (SOFTGOODS) IMPLANT
INSERT OMNIFIT 10 CUP (Insert) ×1 IMPLANT
KIT BASIN OR (CUSTOM PROCEDURE TRAY) ×2 IMPLANT
MANIFOLD NEPTUNE II (INSTRUMENTS) ×2 IMPLANT
NDL SAFETY ECLIPSE 18X1.5 (NEEDLE) IMPLANT
NEEDLE HYPO 18GX1.5 SHARP (NEEDLE)
NS IRRIG 1000ML POUR BTL (IV SOLUTION) ×2 IMPLANT
PACK TOTAL JOINT (CUSTOM PROCEDURE TRAY) ×2 IMPLANT
PASSER SUT SWANSON 36MM LOOP (INSTRUMENTS) ×2 IMPLANT
POSITIONER SURGICAL ARM (MISCELLANEOUS) ×2 IMPLANT
SPONGE GAUZE 4X4 12PLY (GAUZE/BANDAGES/DRESSINGS) ×2 IMPLANT
SPONGE LAP 18X18 X RAY DECT (DISPOSABLE) ×2 IMPLANT
STAPLER VISISTAT 35W (STAPLE) ×2 IMPLANT
SUCTION FRAZIER TIP 10 FR DISP (SUCTIONS) ×2 IMPLANT
SUT ETHIBOND NAB CT1 #1 30IN (SUTURE) ×4 IMPLANT
SUT VIC AB 1 CT1 27 (SUTURE) ×6
SUT VIC AB 1 CT1 27XBRD ANTBC (SUTURE) ×3 IMPLANT
SUT VIC AB 2-0 CT1 27 (SUTURE) ×6
SUT VIC AB 2-0 CT1 TAPERPNT 27 (SUTURE) ×3 IMPLANT
SUT VLOC 180 0 24IN GS25 (SUTURE) ×2 IMPLANT
SWAB COLLECTION DEVICE MRSA (MISCELLANEOUS) ×1 IMPLANT
SYR 50ML LL SCALE MARK (SYRINGE) IMPLANT
TOWEL OR 17X26 10 PK STRL BLUE (TOWEL DISPOSABLE) ×4 IMPLANT
TRAY FOLEY CATH 14FRSI W/METER (CATHETERS) ×2 IMPLANT
TUBE ANAEROBIC SPECIMEN COL (MISCELLANEOUS) IMPLANT
WATER STERILE IRR 1500ML POUR (IV SOLUTION) ×2 IMPLANT

## 2011-07-12 NOTE — Anesthesia Procedure Notes (Signed)
Spinal  Patient location during procedure: OR Start time: 07/12/2011 3:20 PM End time: 07/12/2011 3:30 PM Staffing CRNA/Resident: Paris Lore Performed by: resident/CRNA  Preanesthetic Checklist Completed: patient identified, site marked, surgical consent, pre-op evaluation, timeout performed, IV checked, risks and benefits discussed and monitors and equipment checked Spinal Block Patient position: sitting Prep: Betadine Patient monitoring: heart rate, continuous pulse ox and blood pressure Approach: right paramedian Location: L2-3 Injection technique: single-shot Needle Needle type: Spinocan  Needle gauge: 22 G Needle length: 9 cm Assessment Sensory level: T4 Additional Notes Expiration date of kit checked and confirmed. Patient tolerated procedure well, without complications. Spinal placed l2-l3 X 1 attempt with positive CSF return noted clear easy flush.

## 2011-07-12 NOTE — Brief Op Note (Signed)
07/12/2011  4:43 PM  PATIENT:  Mora Bellman S Bovenzi  78 y.o. female  PRE-OPERATIVE DIAGNOSIS:  right hip osteolysis with poly wear  POST-OPERATIVE DIAGNOSIS:  right hip osteolysis with poly wear  PROCEDURE:  Procedure(s) (LRB): RIGHT HIP ACETABULAR POLYETHYLENE REVISION  SURGEON:  Surgeon(s) and Role:    * Loanne Drilling, MD - Primary  PHYSICIAN ASSISTANT:   ASSISTANTS: Avel Peace, PA-C   ANESTHESIA:   spinal  EBL:  Total I/O In: 1350 [I.V.:1350] Out: 350 [Urine:150; Blood:200]  DRAINS: (Medium) Hemovact drain(s) in the right hip with  Suction Open   LOCAL MEDICATIONS USED:  OTHER Exparel  COUNTS:  YES  DICTATION: .Other Dictation: Dictation Number [id number]  PLAN OF CARE: Admit to inpatient   PATIENT DISPOSITION:  PACU - hemodynamically stable.   Gus Rankin Gedeon Brandow, MD    07/12/2011, 4:50 PM

## 2011-07-12 NOTE — H&P (View-Only) (Signed)
Diane Lucas  DOB: [birth year] Married / Language: English / Race: White Female  Date of Admission  07/12/2011  Chief Complaint:  Right Hip Pain  History of Present Illness The patient is a 78 year old female who comes in for a preoperative History and Physical. The patient is scheduled for a right right hip polyethylene versus acetabular revision to be performed by Dr. Gus Lucas. Aluisio, MD at [facility] on 07/12/2011. The patient is a 78 year old female who presents for a recheck of Follow-up Hip. The patient is being followed for their right total hip pain. Symptoms reported today include: pain, stiffness, pain with weightbearing and difficulty ambulating. The patient feels that they are doing poorly and report their pain level to be moderate to severe. The following medication has been used for pain control: Tylenol. I saw [name] over a month ago. Her hip was actually doing better. She did have eccentric polyethylene wear of the acetabular polyethylene. She did not have any associated osteolysis. She states now the pain has recurred. This is occurring in the groin and going down the anterior thigh. The hip is not giving out on her. It is worse with activity. She is having slight pain with rest. She has not had fevers, chills or infectious symptoms. They have been treated conservatively in the past for the above stated problem and despite conservative measures, they continue to have progressive pain and severe functional limitations and dysfunction. They have failed non-operative management. It is felt that they would benefit from undergoing total joint replacement. Risks and benefits of the procedure have been discussed with the patient and they elect to proceed with surgery. There are no active contraindications to surgery such as ongoing infection or rapidly progressive neurological disease.  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 childhood, age 54 Menopause Measles Mumps  Allergies Benazepril HCl *ANTIHYPERTENSIVES*. Swelling. swelling in feet Aspirin (Salicylates). causes low platletes Minocin *TETRACYCLINES*. Rash.  Family History Heart Disease. grandmother mothers side and grandfather mothers side Cerebrovascular Accident. mother and father Cancer. sister, brother and grandfather fathers side Father. Deceased, Cerebrovascular Accident. ag 69 Mother. Deceased, Cerebrovascular Accident. age 30 Brother 1. Deceased, Cancer. age 3 Brother 2. Deceased, Heart disease. age 21 Brother 3. Deceased, Heart disease. age 97 Brother 4. Deceased, Heart disease. age 61  Social History Alcohol use. never consumed alcohol Children. 2 Current work status. retired Financial planner (Currently). no Drug/Alcohol Rehab (Previously). no Exercise. does other Illicit drug use. no Living situation. live with spouse Marital status. married Pain Contract. no Tobacco / smoke exposure. no Tobacco use. never smoker Post-Surgical Plans. Plan is to go home following the surgery. Advance Directives. Living Will, Healthcare POA  Medication History Simvastatin (40MG  Tablet, Oral at bedtime) Active. Levothyroxine Sodium ( Tablet, Oral every morning before breakfast) Active. Hydrochlorothiazide (25MG  Tablet, Oral every morning before breakfast) Active. Alendronate Sodium (70MG  Tablet, Oral once a week) Active. Omeprazole (20MG  Capsule DR, Oral every morning before breakfast) Active.  Past Surgical History Spinal Surgery. Date: 05/2010. Ruptured Disc Total Hip Replacement. Date: 12/1988. right Appendectomy. Date: 82. Hysterectomy.  Date: 12/2005. complete (non-cancerous) Dilation and Curettage of Uterus. Date: 11/2005.  Review of Systems General:Present- Fatigue. Not Present- Chills, Fever, Night Sweats, Weight Gain, Weight Loss and Memory Loss.  Skin:Not Present- Hives, Itching, Rash, Eczema and Lesions. HEENT:Not Present- Tinnitus, Headache, Double Vision, Visual Loss, Hearing Loss and Dentures. Respiratory:Not Present- Shortness of breath with exertion, Shortness of breath at rest, Allergies, Coughing up blood and Chronic Cough. Cardiovascular:Not Present- Chest Pain, Racing/skipping heartbeats, Difficulty Breathing Lying Down, Murmur, Swelling and Palpitations. Gastrointestinal:Present- Heartburn. Not Present- Bloody Stool, Abdominal Pain, Vomiting, Nausea, Constipation, Diarrhea, Difficulty Swallowing, Jaundice and Loss of appetitie. Female Genitourinary:Not Present- Blood in Urine, Urinary frequency, Weak urinary stream, Discharge, Flank Pain, Incontinence, Painful Urination, Urgency, Urinary Retention and Urinating at Night. Musculoskeletal:Present- Muscle Weakness and Leg Cramps. Not Present- Muscle Pain, Joint Swelling, Joint Pain, Back Pain, Morning Stiffness and Spasms. Neurological:Not Present- Tremor, Dizziness, Blackout spells, Paralysis, Difficulty with balance and Weakness. Psychiatric:Not Present- Insomnia.   Vitals Weight: 175 lb Height: 63 in Body Surface Area: 1.88 m Body Mass Index: 31 kg/m Resp.: 14 (Unlabored) BP: 148/68 (Sitting, Right Arm, Standard)    Physical Exam The physical exam findings are as follows:   General Mental Status - Alert, cooperative and good historian. General Appearance- pleasant. Not in acute distress. Orientation- Oriented X3. Build & Nutrition- Well nourished and Well developed.   Head and Neck Head- normocephalic, atraumatic . Neck Global Assessment- supple. no bruit auscultated on the right and no bruit auscultated on the  left.   Eye Pupil- Bilateral- Regular and Round. Motion- Bilateral- EOMI.   Note: wears glasses  Chest and Lung Exam Auscultation: Breath sounds:- clear at anterior chest wall and - clear at posterior chest wall. Adventitious sounds:- No Adventitious sounds.   Cardiovascular Auscultation:Rhythm- Regular rate and rhythm. Heart Sounds- S1 WNL and S2 WNL. Murmurs & Other Heart Sounds:Auscultation of the heart reveals - No Murmurs.   Abdomen Palpation/Percussion:Tenderness- Abdomen is non-tender to palpation. Rigidity (guarding)- Abdomen is soft. Auscultation:Auscultation of the abdomen reveals - Bowel sounds normal.   Female Genitourinary Not done, not pertinent to present illness  Musculoskeletal She is alert and oriented. No apparent distress. The right hip can be flexed to about 100, rotated in 20, out 30 and abducted to 30 with some pain on rotation. She has a slight tenderness over her greater trochanter.  RADIOGRAPHS: Previous radiographs show eccentric polyethylene wear. No definitive lysis in the acetabulum. There is some trochanteric lysis. The components appear stable.  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  PCP - Dr. Herb Grays - Patient has been seen preoperatively and felt to be stable for upcoming surgery.  Signed electronically by Roberts Gaudy, PA-C

## 2011-07-12 NOTE — Transfer of Care (Signed)
Immediate Anesthesia Transfer of Care Note  Patient: Diane Lucas  Procedure(s) Performed: Procedure(s) (LRB): ACETABULAR REVISION (Right)  Patient Location: PACU  Anesthesia Type: Regional  Level of Consciousness: awake and alert   Airway & Oxygen Therapy: Patient Spontanous Breathing and Patient connected to face mask oxygen  Post-op Assessment: Post -op Vital signs reviewed and stable  Post vital signs: Reviewed and stable  Complications: No apparent anesthesia complications

## 2011-07-12 NOTE — Anesthesia Postprocedure Evaluation (Signed)
  Anesthesia Post-op Note  Patient: Diane Lucas  Procedure(s) Performed: Procedure(s) (LRB): ACETABULAR REVISION (Right)  Patient Location: PACU  Anesthesia Type: Spinal  Level of Consciousness: awake and alert   Airway and Oxygen Therapy: Patient Spontanous Breathing  Post-op Pain: mild  Post-op Assessment: Post-op Vital signs reviewed, Patient's Cardiovascular Status Stable, Respiratory Function Stable, Patent Airway and No signs of Nausea or vomiting  Post-op Vital Signs: stable  Complications: No apparent anesthesia complications

## 2011-07-12 NOTE — Interval H&P Note (Signed)
History and Physical Interval Note:  07/12/2011 3:11 PM  Diane Lucas  has presented today for surgery, with the diagnosis of right hip osteolysis with poly wear  The various methods of treatment have been discussed with the patient and family. After consideration of risks, benefits and other options for treatment, the patient has consented to  Procedure(s) (LRB): ACETABULAR REVISION (Right) as a surgical intervention .  The patient's history has been reviewed, patient examined, no change in status, stable for surgery.  I have reviewed the patients' chart and labs.  Questions were answered to the patient's satisfaction.     Loanne Drilling

## 2011-07-12 NOTE — H&P (Signed)
Diane Lucas  DOB: 07/28/1930 Married / Language: English / Race: White Female  Date of Admission  07/12/2011  Chief Complaint:  Right Hip Pain  History of Present Illness The patient is a 78 year old female who comes in for a preoperative History and Physical. The patient is scheduled for a right right hip polyethylene versus acetabular revision to be performed by Dr. Frank V. Aluisio, MD at [facility] on 07/12/2011. The patient is a 78 year old female who presents for a recheck of Follow-up Hip. The patient is being followed for their right total hip pain. Symptoms reported today include: pain, stiffness, pain with weightbearing and difficulty ambulating. The patient feels that they are doing poorly and report their pain level to be moderate to severe. The following medication has been used for pain control: Tylenol. I saw Diane Lucas over a month ago. Her hip was actually doing better. She did have eccentric polyethylene wear of the acetabular polyethylene. She did not have any associated osteolysis. She states now the pain has recurred. This is occurring in the groin and going down the anterior thigh. The hip is not giving out on her. It is worse with activity. She is having slight pain with rest. She has not had fevers, chills or infectious symptoms. They have been treated conservatively in the past for the above stated problem and despite conservative measures, they continue to have progressive pain and severe functional limitations and dysfunction. They have failed non-operative management. It is felt that they would benefit from undergoing total joint replacement. Risks and benefits of the procedure have been discussed with the patient and they elect to proceed with surgery. There are no active contraindications to surgery such as ongoing infection or rapidly progressive neurological disease.  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 childhood, age 12 Menopause Measles Mumps  Allergies Benazepril HCl *ANTIHYPERTENSIVES*. Swelling. swelling in feet Aspirin (Salicylates). causes low platletes Minocin *TETRACYCLINES*. Rash.  Family History Heart Disease. grandmother mothers side and grandfather mothers side Cerebrovascular Accident. mother and father Cancer. sister, brother and grandfather fathers side Father. Deceased, Cerebrovascular Accident. ag 94 Mother. Deceased, Cerebrovascular Accident. age 88 Brother 1. Deceased, Cancer. age 84 Brother 2. Deceased, Heart disease. age 81 Brother 3. Deceased, Heart disease. age 59 Brother 4. Deceased, Heart disease. age 51  Social History Alcohol use. never consumed alcohol Children. 2 Current work status. retired Drug/Alcohol Rehab (Currently). no Drug/Alcohol Rehab (Previously). no Exercise. does other Illicit drug use. no Living situation. live with spouse Marital status. married Pain Contract. no Tobacco / smoke exposure. no Tobacco use. never smoker Post-Surgical Plans. Plan is to go home following the surgery. Advance Directives. Living Will, Healthcare POA  Medication History Simvastatin (40MG Tablet, Oral at bedtime) Active. Levothyroxine Sodium (50MCG Tablet, Oral every morning before breakfast) Active. Hydrochlorothiazide (25MG Tablet, Oral every morning before breakfast) Active. Alendronate Sodium (70MG Tablet, Oral once a week) Active. Omeprazole (20MG Capsule DR, Oral every morning before breakfast) Active.  Past Surgical History Spinal Surgery. Date: 05/2010. Ruptured Disc Total Hip Replacement. Date: 12/1988. right Appendectomy. Date: 1954. Hysterectomy.  Date: 12/2005. complete (non-cancerous) Dilation and Curettage of Uterus. Date: 11/2005.  Review of Systems General:Present- Fatigue. Not Present- Chills, Fever, Night Sweats, Weight Gain, Weight Loss and Memory Loss.   Skin:Not Present- Hives, Itching, Rash, Eczema and Lesions. HEENT:Not Present- Tinnitus, Headache, Double Vision, Visual Loss, Hearing Loss and Dentures. Respiratory:Not Present- Shortness of breath with exertion, Shortness of breath at rest, Allergies, Coughing up blood and Chronic Cough. Cardiovascular:Not Present- Chest Pain, Racing/skipping heartbeats, Difficulty Breathing Lying Down, Murmur, Swelling and Palpitations. Gastrointestinal:Present- Heartburn. Not Present- Bloody Stool, Abdominal Pain, Vomiting, Nausea, Constipation, Diarrhea, Difficulty Swallowing, Jaundice and Loss of appetitie. Female Genitourinary:Not Present- Blood in Urine, Urinary frequency, Weak urinary stream, Discharge, Flank Pain, Incontinence, Painful Urination, Urgency, Urinary Retention and Urinating at Night. Musculoskeletal:Present- Muscle Weakness and Leg Cramps. Not Present- Muscle Pain, Joint Swelling, Joint Pain, Back Pain, Morning Stiffness and Spasms. Neurological:Not Present- Tremor, Dizziness, Blackout spells, Paralysis, Difficulty with balance and Weakness. Psychiatric:Not Present- Insomnia.   Vitals Weight: 175 lb Height: 63 in Body Surface Area: 1.88 m Body Mass Index: 31 kg/m Resp.: 14 (Unlabored) BP: 148/68 (Sitting, Right Arm, Standard)    Physical Exam The physical exam findings are as follows:   General Mental Status - Alert, cooperative and good historian. General Appearance- pleasant. Not in acute distress. Orientation- Oriented X3. Build & Nutrition- Well nourished and Well developed.   Head and Neck Head- normocephalic, atraumatic . Neck Global Assessment- supple. no bruit auscultated on the right and no bruit auscultated on the  left.   Eye Pupil- Bilateral- Regular and Round. Motion- Bilateral- EOMI.   Note: wears glasses  Chest and Lung Exam Auscultation: Breath sounds:- clear at anterior chest wall and - clear at posterior chest wall. Adventitious sounds:- No Adventitious sounds.   Cardiovascular Auscultation:Rhythm- Regular rate and rhythm. Heart Sounds- S1 WNL and S2 WNL. Murmurs & Other Heart Sounds:Auscultation of the heart reveals - No Murmurs.   Abdomen Palpation/Percussion:Tenderness- Abdomen is non-tender to palpation. Rigidity (guarding)- Abdomen is soft. Auscultation:Auscultation of the abdomen reveals - Bowel sounds normal.   Female Genitourinary Not done, not pertinent to present illness  Musculoskeletal She is alert and oriented. No apparent distress. The right hip can be flexed to about 100, rotated in 20, out 30 and abducted to 30 with some pain on rotation. She has a slight tenderness over her greater trochanter.  RADIOGRAPHS: Previous radiographs show eccentric polyethylene wear. No definitive lysis in the acetabulum. There is some trochanteric lysis. The components appear stable.  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  PCP - Dr. Tammy Spear - Patient has been seen preoperatively and felt to be stable for upcoming surgery.  Signed electronically by DREW L Carlinda Ohlson, PA-C 

## 2011-07-12 NOTE — Plan of Care (Signed)
Problem: Consults Goal: Diagnosis- Total Joint Replacement Right acetabular revision

## 2011-07-12 NOTE — Anesthesia Preprocedure Evaluation (Signed)
Anesthesia Evaluation  Patient identified by MRN, date of birth, ID band Patient awake    Reviewed: Allergy & Precautions, H&P , NPO status , Patient's Chart, lab work & pertinent test results  Airway Mallampati: II TM Distance: >3 FB Neck ROM: Full    Dental No notable dental hx.    Pulmonary neg pulmonary ROS,  breath sounds clear to auscultation  Pulmonary exam normal       Cardiovascular hypertension, Pt. on medications negative cardio ROS  Rhythm:Regular Rate:Normal     Neuro/Psych negative neurological ROS  negative psych ROS   GI/Hepatic negative GI ROS, Neg liver ROS, GERD-  Medicated and Controlled,  Endo/Other  negative endocrine ROS  Renal/GU negative Renal ROS  negative genitourinary   Musculoskeletal negative musculoskeletal ROS (+)   Abdominal   Peds negative pediatric ROS (+)  Hematology negative hematology ROS (+)   Anesthesia Other Findings   Reproductive/Obstetrics negative OB ROS                           Anesthesia Physical Anesthesia Plan  ASA: II  Anesthesia Plan: Spinal   Post-op Pain Management:    Induction:   Airway Management Planned: Simple Face Mask  Additional Equipment:   Intra-op Plan:   Post-operative Plan:   Informed Consent: I have reviewed the patients History and Physical, chart, labs and discussed the procedure including the risks, benefits and alternatives for the proposed anesthesia with the patient or authorized representative who has indicated his/her understanding and acceptance.   Dental advisory given  Plan Discussed with: CRNA  Anesthesia Plan Comments:         Anesthesia Quick Evaluation

## 2011-07-13 LAB — BASIC METABOLIC PANEL
BUN: 20 mg/dL (ref 6–23)
CO2: 24 mEq/L (ref 19–32)
Calcium: 8.9 mg/dL (ref 8.4–10.5)
Chloride: 104 mEq/L (ref 96–112)
Creatinine, Ser: 0.84 mg/dL (ref 0.50–1.10)
GFR calc Af Amer: 75 mL/min — ABNORMAL LOW (ref >90)
GFR calc non Af Amer: 65 mL/min — ABNORMAL LOW (ref >90)
Glucose, Bld: 153 mg/dL — ABNORMAL HIGH (ref 70–99)
Potassium: 3.9 mEq/L (ref 3.5–5.1)
Sodium: 138 mEq/L (ref 135–145)

## 2011-07-13 LAB — CBC
HCT: 34.9 % — ABNORMAL LOW (ref 36.0–46.0)
Hemoglobin: 11.6 g/dL — ABNORMAL LOW (ref 12.0–15.0)
MCH: 29 pg (ref 26.0–34.0)
MCHC: 33.2 g/dL (ref 30.0–36.0)
MCV: 87.3 fL (ref 78.0–100.0)
Platelets: 67 10*3/uL — ABNORMAL LOW (ref 150–400)
RBC: 4 MIL/uL (ref 3.87–5.11)
RDW: 12.3 % (ref 11.5–15.5)
WBC: 13.1 10*3/uL — ABNORMAL HIGH (ref 4.0–10.5)

## 2011-07-13 NOTE — Op Note (Signed)
Diane Lucas, Diane Lucas                  ACCOUNT NO.:  [id number]  MEDICAL RECORD NO.:  [id number]  LOCATION:  1601                         FACILITY:  [facility]  PHYSICIAN:  Ollen Gross, M.D.    DATE OF BIRTH:  [birth year]  DATE OF PROCEDURE:  07/12/2011 DATE OF DISCHARGE:                              OPERATIVE REPORT   PREOPERATIVE DIAGNOSIS:  Right hip polyethylene failure with osteolysis.  POSTOPERATIVE DIAGNOSIS:  Right hip polyethylene failure with osteolysis.  PROCEDURE:  Right hip acetabular polyethylene revision.  SURGEON:  Ollen Gross, M.D.  ASSISTANT:  Avel Peace, [facility].  ANESTHESIA:  Spinal.  ESTIMATED BLOOD LOSS:  200.  DRAINS:  Hemovac x1.  COMPLICATION:  None.  CONDITION:  Stable to recovery.  BRIEF CLINICAL NOTE:  Diane Lucas is a 78 year old female, who had a total hip arthroplasty done many years ago by Dr. Fannie Knee.  She in the past year has had episodes of pain which progressively got worse in her right groin.  She had normal-appearing prosthesis with some osteolysis and eccentric polyethylene wear.  No signs of any loosening of her metal components.  MRI showed no evidence of any lytic cyst.  She presents now for polyethylene versus total hip arthroplasty revision.  PROCEDURE IN DETAIL:  After successful administration of spinal anesthetic, the patient was placed in the left lateral decubitus position with the right side up and held with the hip positioner.  Right lower extremity was isolated from her perineum with plastic drapes and prepped and draped in usual sterile fashion.  Her old incision was too far anterior, thus a short posterolateral incision was made with a 10 blade through the subcutaneous tissue to the fascia lata, which was incised in line with the skin incision.  We did not encounter any fluid at this point.  The sciatic nerve was palpated and protected.  Posterior pseudocapsule was excised off the femur and it was a large amount of osteolytic  wear debris within the joint.  The osteolytic debris was thoroughly debrided from the joint.  The hip was then dislocated.  The femoral head with a 28 mm +5.  I attempted to remove the femoral head from the femoral neck, but unfortunately, the head was essentially cold welded to the neck.  We thus left it intact.  The femur was then translated anteriorly to gain exposure to the acetabulum.  There was significant wear off the acetabular polyethylene which was eccentric in nature.  We removed the polyethylene from the acetabular shell.  The acetabular shell was well fixed.  It was in excellent position.  The edge of the shell was cleared of all soft tissue and then a new Stryker 28 mm 10 degree liner was then placed into the 48 mm dual geometry Osteonics cup.  It was placed with the elevated rim at the 11 o'clock position.  The hip was then reduced with excellent stability.  There was full extension and full external rotation, 70 degrees flexion, 40 degrees adduction, 90 degrees internal rotation, 90 degrees of flexion, about 50 degrees internal rotation.  Prior to this, I inspected the stem and the stem was in good position and was well fixed.  No evidence of any significant lysis around the proximal part of the stem.  The wound was then copiously irrigated with saline solution and the short rotators and pseudocapsule reattached to the femur through drill holes with Ethibond suture.  Fascia lata was closed over Hemovac drain with a running #1 V lock suture.  Prior to this, 20 mL of Exparel mixed with 50 mL of saline were injected into the gluteal musculature, fascia lata, and subcu tissues.  Fascia lata was closed and then another running V lock was used in the deep subcu tissues, 2-0 Vicryl in the superficial subcu and subcuticular running 4-0 Monocryl.  The drains hooked to suction.  Incision was cleaned and dried and Steri-Strips and a bulky sterile dressing applied.  Patient was then  awakened and transferred to recovery room in stable condition.  Please note that the surgical assistant was a medical necessity for this procedure in order to perform it in a safe and expeditious manner. Surgical assistant was necessary for holding the leg in the proper position, and for retracting the vital neurovascular structures away from the hip.  Also, necessary for retracting the femoral prosthesis out of the way of the acetabulum in order to gain exposure to the acetabulum.     Ollen Gross, M.D.     FA/MEDQ  D:  07/12/2011  T:  07/13/2011  Job:  [id number]

## 2011-07-13 NOTE — Progress Notes (Signed)
Physical Therapy Treatment Patient Details Name: Diane Lucas MRN: [id number] DOB: 22-Oct-1930 Today's Date: 07/13/2011 Time: 4098-1191 PT Time Calculation (min): 24 min  PT Assessment / Plan / Recommendation Comments on Treatment Session  Good progress with increased gait distance and pt educated on exercises for LE strengthening.    Follow Up Recommendations  Home health PT       Equipment Recommendations  Rolling walker with 5" wheels;None recommended by OT       Frequency 7X/week   Plan Discharge plan remains appropriate;Frequency remains appropriate    Precautions / Restrictions Precautions Precautions: Posterior Hip Precaution Booklet Issued: Yes (comment) Precaution Comments: Pt able to recall 2/3 posterior hip precautions without cues. Reeducated pt on all 3 hip precautions. cues to demo with mobility required with this session. Restrictions RLE Weight Bearing: Weight bearing as tolerated Other Position/Activity Restrictions: WBAT    Pertinent Vitals/Pain Reported 2/10 left hip pain before and after session. Cryo applied to left hip after session.    Mobility  Bed Mobility Bed Mobility: Sit to Supine;Scooting to HOB Sit to Supine: 4: Min assist;HOB flat Scooting to HOB: 5: Supervision;With rail Details for Bed Mobility Assistance: cues for sequence and use of arms with lying down into bed. cues for technique (scooting hip back and at an angle, lying back with arms to lower trunk slowly while elevating legs onto bed. min assit to clear bed surface with left LE. Transfers Transfers: Sit to Stand;Stand to Sit Sit to Stand: 4: Min guard;From chair/3-in-1;With upper extremity assist;With armrests Stand to Sit: 4: Min guard;To bed;With upper extremity assist Details for Transfer Assistance: cues for hand and left LE placement for safety with transfers. Ambulation/Gait Ambulation/Gait Assistance: 4: Min guard Ambulation Distance (Feet): 140 Feet Assistive device: Rolling  walker Ambulation/Gait Assistance Details: cues for posture and walker placement/saftey with gait. Gait Pattern: Step-through pattern;Decreased stride length    Exercises Total Joint Exercises Ankle Circles/Pumps: AROM;Both;10 reps;Supine Quad Sets: AROM;Strengthening;Left;10 reps;Supine Short Arc Quad: AAROM;Strengthening;Left;10 reps;Supine Heel Slides: AAROM;Strengthening;Left;10 reps;Supine Hip ABduction/ADduction: AAROM;Strengthening;Left;10 reps;Supine    PT Goals Acute Rehab PT Goals PT Goal: Sit to Stand - Progress: Progressing toward goal PT Goal: Ambulate - Progress: Progressing toward goal  Visit Information  Last PT Received On: 07/13/11 Assistance Needed: +1    Subjective Data  Subjective: No new complaints, agreeable to therapy this pm.   Cognition  Overall Cognitive Status: Appears within functional limits for tasks assessed/performed Arousal/Alertness: Awake/alert Orientation Level: Appears intact for tasks assessed Behavior During Session: [facility] for tasks performed       End of Session PT - End of Session Equipment Utilized During Treatment: Gait belt Activity Tolerance: Patient tolerated treatment well Patient left: in bed;with call bell/phone within reach Nurse Communication: Mobility status;Precautions    Sallyanne Kuster 07/13/2011, 3:16 PM  Sallyanne Kuster, PTA Office- [phone]

## 2011-07-13 NOTE — Evaluation (Addendum)
Physical Therapy Evaluation Patient Details Name: Diane Lucas MRN: [id number] DOB: March 06, 1930 Today's Date: 07/13/2011 Time: 7829-5621 PT Time Calculation (min): 25 min  PT Assessment / Plan / Recommendation Clinical Impression  78 y.o. female POD #1 acetabular polyethylene revision. Pt did well with mobility. She ambulated 120' with RW, min A for transfers. Pt cares for WC bound husband and plans to DC home with 24 hour private aide.  HHPT vs. SNF  and RW recommended.Pt states she has arranged for 24 hour assist for her husband for 2 weeks.  Pt informed she will not be able to physically lift her husband 2 weeks post op and will need to consider ST-SNF or arrange further care for her husband at home.     PT Assessment  Patient needs continued PT services    Follow Up Recommendations  Home health PT    Barriers to Discharge        Equipment Recommendations  Rolling walker with 5" wheels    Recommendations for Other Services OT consult   Frequency 7X/week    Precautions / Restrictions Restrictions POSTERIOR HIP PRECAUTIONS Other Position/Activity Restrictions: WBAT   Pertinent Vitals/Pain **2/10 R hip Ice applied*      Mobility  Bed Mobility Bed Mobility: Supine to Sit Supine to Sit: 3: Mod assist Details for Bed Mobility Assistance: pt 65%, assist to elevate trunk and support RLE Transfers Transfers: Sit to Stand;Stand to Sit Sit to Stand: 4: Min guard Stand to Sit: 4: Min guard Details for Transfer Assistance: VCs hand placement Ambulation/Gait Ambulation/Gait Assistance: 4: Min guard Ambulation Distance (Feet): 120 Feet Assistive device: Rolling walker Gait Pattern: Step-through pattern    Exercises     PT Diagnosis: Difficulty walking;Acute pain  PT Problem List: Decreased strength;Decreased activity tolerance;Decreased knowledge of use of DME;Pain PT Treatment Interventions: Gait training;DME instruction;Functional mobility training;Therapeutic  exercise;Patient/family education   PT Goals Acute Rehab PT Goals PT Goal Formulation: With patient Pt will go Supine/Side to Sit: Independently;with HOB 0 degrees PT Goal: Supine/Side to Sit - Progress: Goal set today Pt will go Sit to Stand: with modified independence PT Goal: Sit to Stand - Progress: Goal set today Pt will Ambulate: >150 feet;with modified independence;with rolling walker PT Goal: Ambulate - Progress: Goal set today  Visit Information  Last PT Received On: 07/13/11 Assistance Needed: +1    Subjective Data  Subjective: This is much easier than the other surguries. Patient Stated Goal: DC home to care for husband, who is in [facility]   Prior Functioning  Home Living Lives With: Spouse Available Help at Discharge: Home health;Personal care attendant;Available 24 hours/day Type of Home: House Home Access: Ramped entrance Home Layout: One level Home Adaptive Equipment: Bedside commode/3-in-1 Prior Function Level of Independence: Independent (used golf cart for long distances) Communication Communication: No difficulties    Cognition  Overall Cognitive Status: Appears within functional limits for tasks assessed/performed Arousal/Alertness: Awake/alert Orientation Level: Appears intact for tasks assessed Behavior During Session: [facility] for tasks performed    Extremity/Trunk Assessment Right Upper Extremity Assessment RUE ROM/Strength/Tone: [facility] for tasks assessed Left Upper Extremity Assessment LUE ROM/Strength/Tone: WFL for tasks assessed Right Lower Extremity Assessment RLE ROM/Strength/Tone: Deficits RLE ROM/Strength/Tone Deficits: hip -3/5; knee/ankle WFL RLE Sensation: WFL - Light Touch RLE Coordination: WFL - gross/fine motor Left Lower Extremity Assessment LLE ROM/Strength/Tone: Within functional levels LLE Sensation: WFL - Light Touch LLE Coordination: WFL - gross/fine motor Trunk Assessment Trunk Assessment: Kyphotic   Balance    End of Session  PT -  End of Session Equipment Utilized During Treatment: Gait belt Activity Tolerance: Patient tolerated treatment well Patient left: in chair;with call bell/phone within reach Nurse Communication: Mobility status  GP     Diane Lucas 07/13/2011, 10:50 AM [phone]

## 2011-07-13 NOTE — Evaluation (Addendum)
Occupational Therapy Evaluation Patient Details Name: Diane Lucas MRN: [id number] DOB: December 04, 1930 Today's Date: 07/13/2011 Time: 1324-4010 OT Time Calculation (min): 59 min  OT Assessment / Plan / Recommendation Clinical Impression  This 78 y.o. female admitted for Rt. THA revision.  Pt. will benefit from OT to maximize safety and independence with BADLs to allow pt. to return home with supervision and HHOT.  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  She has planned to have hired caregivers 24 hours/day for the  first 2 weeks.  She became tearful when informed that she likely wouldn't be able to physically assist pt. with transfers for at least 6-8 wks.  (she did acknowlege that PA had told her at least 6 wks).  Encouraged her to have discussion with MD re: this so that she could plan adequately.  Pt. May benefit from SNF level rehab given that she is caregiver for husband and the need to be as independent at possible upon return home.     OT Assessment  Patient needs continued OT Services    Follow Up Recommendations  Home health OT;Supervision/Assistance - 24 hour vs. SNF level rehab   Barriers to Discharge None    Equipment Recommendations  Rolling walker with 5" wheels;None recommended by OT    Recommendations for Other Services    Frequency  Min 2X/week    Precautions / Restrictions Precautions Precautions: Posterior Hip Precaution Booklet Issued: Yes (comment) Precaution Comments: sign hung in room Restrictions Other Position/Activity Restrictions: WBAT       ADL  Eating/Feeding: Simulated;Independent Where Assessed - Eating/Feeding: Chair Grooming: Simulated;Wash/dry hands;Wash/dry face;Teeth care;Minimal assistance Where Assessed - Grooming: Supported standing Upper Body Bathing: Simulated;Set up Where Assessed - Upper Body Bathing: Supported sitting Lower Body Bathing: Simulated;Moderate assistance Where Assessed - Lower Body  Bathing: Supported sit to stand Upper Body Dressing: Simulated;Supervision/safety Where Assessed - Upper Body Dressing: Supported sitting Lower Body Dressing: Performed;Minimal assistance Where Assessed - Lower Body Dressing: Supported sit to Pharmacist, hospital: Performed;Minimal Dentist Method: Sit to Barista: Comfort height toilet;Grab bars Toileting - Architect and Hygiene: Performed;Minimal assistance Where Assessed - Engineer, mining and Hygiene: Standing Equipment Used: Rolling walker;Reacher;Sock aid Transfers/Ambulation Related to ADLs: Pt. ambulated to BR with min guard assist ADL Comments: pt. instructed in precautions, AE.  Instructed pt. that she should not attempt to physically assist husband with ADLs or transfers until cleared by MD.      OT Diagnosis: Generalized weakness;Acute pain  OT Problem List: Decreased strength;Decreased activity tolerance;Impaired balance (sitting and/or standing);Decreased knowledge of use of DME or AE;Decreased knowledge of precautions;Pain OT Treatment Interventions: Self-care/ADL training;DME and/or AE instruction;Therapeutic activities;Patient/family education   OT Goals Acute Rehab OT Goals OT Goal Formulation: With patient Time For Goal Achievement: 07/20/11 Potential to Achieve Goals: Good ADL Goals Pt Will Perform Grooming: with supervision;Standing at sink ADL Goal: Grooming - Progress: Goal set today Pt Will Perform Lower Body Bathing: with supervision;Sit to stand from chair;Sit to stand from bed ADL Goal: Lower Body Bathing - Progress: Goal set today Pt Will Perform Lower Body Dressing: with supervision;with adaptive equipment;Sit to stand from bed;Sit to stand from chair ADL Goal: Lower Body Dressing - Progress: Goal set today Pt Will Transfer to Toilet: with supervision;Ambulation;Comfort height toilet ADL Goal: Toilet Transfer - Progress: Goal set today Pt  Will Perform Toileting - Clothing Manipulation: with supervision;Standing ADL Goal: Toileting - Clothing Manipulation - Progress: Goal set today  Pt Will Perform Toileting - Hygiene: with supervision;Sit to stand from 3-in-1/toilet ADL Goal: Toileting - Hygiene - Progress: Goal set today Pt Will Perform Tub/Shower Transfer: with supervision;Ambulation;Shower seat with back;Shower transfer ADL Goal: Web designer - Progress: Goal set today  Visit Information  Last OT Received On: 07/13/11 Assistance Needed: +1    Subjective Data  Subjective: "Now it won't be that long until I can lift my husband will it" Patient Stated Goal: To regain independence   Prior Functioning  Home Living Lives With: Spouse Available Help at Discharge: Home health;Personal care attendant;Available 24 hours/day (Aid only available x 2 weeks) Type of Home: House Home Access: Ramped entrance Home Layout: One level Bathroom Shower/Tub: Health visitor: Handicapped height Bathroom Accessibility: Yes How Accessible: Accessible via wheelchair;Accessible via walker Home Adaptive Equipment: Shower chair with back;Grab bars in shower;Reacher Additional Comments: 3-in-1 commode is used by husband.  Pt. reports that PA (or someone at MD office informed her that she would not be able to assist husband with transfers for ~6 wks).  She asked therapist, and became tearful when informed at least 6 wks and maybe longer.  She reports she has only planned for two weeks.  Long discussion with pt. re: precautions, healing, and safety with physically attempting to assist husband prematurely.  Encoureaged her to have frank discussion with MD re: timeframes.  She seemed very resistant to this Prior Function Level of Independence: Independent Able to Take Stairs?: Reciprically Driving: Yes Vocation: Retired Musician: No difficulties Dominant Hand: Right    Cognition  Overall Cognitive Status:  Appears within functional limits for tasks assessed/performed Arousal/Alertness: Awake/alert Orientation Level: Appears intact for tasks assessed Behavior During Session: [facility] for tasks performed    Extremity/Trunk Assessment Right Upper Extremity Assessment RUE ROM/Strength/Tone: WFL for tasks assessed RUE Coordination: WFL - gross/fine motor Left Upper Extremity Assessment LUE ROM/Strength/Tone: WFL for tasks assessed LUE Coordination: WFL - gross/fine motor Right Lower Extremity Assessment RLE ROM/Strength/Tone: Deficits RLE ROM/Strength/Tone Deficits: hip -3/5; knee/ankle WFL RLE Sensation: WFL - Light Touch RLE Coordination: WFL - gross/fine motor Left Lower Extremity Assessment LLE ROM/Strength/Tone: Within functional levels LLE Sensation: WFL - Light Touch LLE Coordination: WFL - gross/fine motor Trunk Assessment Trunk Assessment: Kyphotic   Mobility Bed Mobility Bed Mobility: Supine to Sit Supine to Sit: 3: Mod assist Details for Bed Mobility Assistance: pt 65%, assist to elevate trunk and support RLE Transfers Sit to Stand: 4: Min guard Stand to Sit: 4: Min guard Details for Transfer Assistance: VCs hand placement   Exercise    Balance    End of Session OT - End of Session Activity Tolerance: Patient tolerated treatment well Patient left: in chair;with call bell/phone within reach;with family/visitor present  GO     Treavor Blomquist, Ursula Alert M 07/13/2011, 12:12 PM

## 2011-07-13 NOTE — Progress Notes (Signed)
Subjective: 1 Day Post-Op Procedure(s) (LRB): ACETABULAR REVISION (Right) Patient reports pain as mild.   Patient is well, and has had no acute complaints or problems We will start therapy today.  Plan is to go Home after hospital stay.  Objective: Vital signs in last 24 hours: Temp:  [97.2 F (36.2 C)-97.8 F (36.6 C)] 97.6 F (36.4 C) (06/29 0530) Pulse Rate:  [61-90] 64  (06/29 0530) Resp:  [13-20] 16  (06/29 0530) BP: (116-152)/(49-134) 125/66 mmHg (06/29 0530) SpO2:  [98 %-100 %] 99 % (06/29 0530) Weight:  [79.379 kg (175 lb)] 79.379 kg (175 lb) (06/28 1815)  Intake/Output from previous day:  Intake/Output Summary (Last 24 hours) at 07/13/11 0802 Last data filed at 07/13/11 0600  Gross per 24 hour  Intake 3348.75 ml  Output   1825 ml  Net 1523.75 ml    Intake/Output this shift:    Labs:  Basename 07/13/11 0407  HGB 11.6*    Basename 07/13/11 0407  WBC 13.1*  RBC 4.00  HCT 34.9*  PLT 67*    Basename 07/13/11 0407  NA 138  K 3.9  CL 104  CO2 24  BUN 20  CREATININE 0.84  GLUCOSE 153*  CALCIUM 8.9   No results found for this basename: LABPT:2,INR:2 in the last 72 hours  EXAM General - Patient is Alert, Appropriate and Oriented Extremity - Neurologically intact Neurovascular intact Incision: dressing C/D/I No cellulitis present Compartment soft Dressing - dressing C/D/I Motor Function - intact, moving foot and toes well on exam.  Hemovac pulled without difficulty.  Past Medical History  Diagnosis Date  . Hypertension   . Elevated cholesterol   . Arthritis   . GERD (gastroesophageal reflux disease)   . Thyroid condition   . Vertigo   . Sleep apnea     STOP BANG SCORE 4  . Platelets decreased     Assessment/Plan: 1 Day Post-Op Procedure(s) (LRB): ACETABULAR REVISION (Right) Principal Problem:  *Failed total hip arthroplasty   Advance diet Up with therapy Discharge home with home health on Monday 7/1. Requests [facility]  as they already come out to see her husband  DVT Prophylaxis - Xarelto Weight Bearing As Tolerated right Leg D/C Knee Immobilizer Hemovac Pulled Begin Therapy Hip Preacutions Keep foley until tomorrow. No vaccines.  Loanne Drilling 07/13/2011, 8:02 AM

## 2011-07-14 LAB — CBC
HCT: 31.6 % — ABNORMAL LOW (ref 36.0–46.0)
Hemoglobin: 10.4 g/dL — ABNORMAL LOW (ref 12.0–15.0)
MCV: 89 fL (ref 78.0–100.0)
RDW: 12.7 % (ref 11.5–15.5)
WBC: 14.9 10*3/uL — ABNORMAL HIGH (ref 4.0–10.5)

## 2011-07-14 LAB — BASIC METABOLIC PANEL
BUN: 20 mg/dL (ref 6–23)
Chloride: 106 mEq/L (ref 96–112)
Creatinine, Ser: 0.87 mg/dL (ref 0.50–1.10)
GFR calc Af Amer: 72 mL/min — ABNORMAL LOW (ref >90)
Glucose, Bld: 128 mg/dL — ABNORMAL HIGH (ref 70–99)

## 2011-07-14 NOTE — Progress Notes (Addendum)
Physical Therapy Treatment Patient Details Name: Diane Lucas MRN: [id number] DOB: 14-Jun-1930 Today's Date: 07/14/2011 Time: 4098-1191 PT Time Calculation (min): 24 min  PT Assessment / Plan / Recommendation Comments on Treatment Session  Pt progressing well with mobility. Supervision for transfers and for walking 160' with RW. Minimal pain. Reviewed hip precautions.     Follow Up Recommendations  Home health PT    Barriers to Discharge        Equipment Recommendations  Rolling walker with 5" wheels;None recommended by OT    Recommendations for Other Services    Frequency 7X/week   Plan Discharge plan remains appropriate;Frequency remains appropriate    Precautions / Restrictions Precautions Precautions: Posterior Hip Precaution Booklet Issued: Yes (comment) Precaution Comments: Pt able to recall 2/3 posterior hip precautions without cues. Reeducated pt on all 3 hip precautions.  Restrictions RLE Weight Bearing: Weight bearing as tolerated   Pertinent Vitals/Pain *2/10 R hip with activity Ice applied**    Mobility   Transfers Transfers: Sit to Stand;Stand to Sit Sit to Stand: From chair/3-in-1;With upper extremity assist;With armrests;5: Supervision Stand to Sit: With upper extremity assist;To chair/3-in-1;5: Supervision Details for Transfer Assistance: min verbal cues for hand placement and R LE out in front Ambulation/Gait Ambulation/Gait Assistance: 4: Min guard;5: Supervision Ambulation Distance (Feet): 160 Feet Assistive device: Rolling walker Ambulation/Gait Assistance Details: no LOB Gait Pattern: Step-through pattern    Exercises Total Joint Exercises Ankle Circles/Pumps: AROM;Both;10 reps;Supine Quad Sets: AROM;Both;10 reps;Supine Short Arc Quad: AAROM;Strengthening;10 reps;Supine;Right Heel Slides: AAROM;Strengthening;10 reps;Supine;Right Hip ABduction/ADduction: AAROM;Strengthening;10 reps;Supine;Right Straight Leg Raises: AAROM;Strengthening;Right;10  reps;Supine   PT Diagnosis:    PT Problem List:   PT Treatment Interventions:     PT Goals Acute Rehab PT Goals PT Goal Formulation: With patient Time For Goal Achievement: 07/18/11 Potential to Achieve Goals: Good Pt will go Supine/Side to Sit: Independently;with HOB 0 degrees Pt will go Sit to Stand: with modified independence PT Goal: Sit to Stand - Progress: Progressing toward goal Pt will Ambulate: >150 feet;with modified independence;with rolling walker PT Goal: Ambulate - Progress: Progressing toward goal Pt will Perform Home Exercise Program: with supervision, verbal cues required/provided PT Goal: Perform Home Exercise Program - Progress: Progressing toward goal  Visit Information  Last PT Received On: 07/14/11 Assistance Needed: +1    Subjective Data  Subjective: I was able to go to the bathroom! Patient Stated Goal: DC home   Cognition  Overall Cognitive Status: Appears within functional limits for tasks assessed/performed Arousal/Alertness: Awake/alert Orientation Level: Appears intact for tasks assessed Behavior During Session: [facility] for tasks performed    Balance     End of Session PT - End of Session Equipment Utilized During Treatment: Gait belt Activity Tolerance: Patient tolerated treatment well Patient left: with call bell/phone within reach;in chair Nurse Communication: Mobility status;Precautions   GP     Ralene Bathe Kistler 07/14/2011, 1:36 PM [phone]

## 2011-07-14 NOTE — Progress Notes (Addendum)
Occupational Therapy Treatment Patient Details Name: Diane Lucas MRN: [id number] DOB: Sep 13, 1930 Today's Date: 07/14/2011 Time: 6962-9528 OT Time Calculation (min): 45 min  OT Assessment / Plan / Recommendation Comments on Treatment Session Pt is s/p R THA revision and did well during session practicing with AE and with functional transfers. Pt tearful and concerned about husband's care at home. Discussed at length recommendation for SNF if pt agreeable.    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 Pt prefers [facility] if she does decide SNF   Barriers to Discharge       Equipment Recommendations  Rolling walker with 5" wheels;None recommended by OT    Recommendations for Other Services    Frequency Min 2X/week   Plan Discharge plan needs to be updated    Precautions / Restrictions Precautions Precautions: Posterior Hip Precaution Booklet Issued: Yes (comment) Restrictions Weight Bearing Restrictions: No RLE Weight Bearing: Weight bearing as tolerated        ADL  Lower Body Dressing: Performed;Minimal assistance;Other (comment) (doffed sock with reacher; donned with sock aid) Where Assessed - Lower Body Dressing: Unsupported sitting Toilet Transfer: Simulated;Minimal assistance Toilet Transfer Method: Other (comment) (ambulating) ADL Comments: Pt able to state 1/3 hip precautions. reviewed all with patient. Reviewed/discussed AE and demonstrated all AE for pt. She practiced with sock aid and reacher to doff and don sock. Pt did well with functional transfers- min cues for hand and R LE out in front prior to sit and stand. Pt tearful during session and very concerned about her husband and his care at home. Discussed that it will be more than 2 weeks until she would be able to physically care for husband and strongly encoruaged pt to think about SNF  to get rehab and be more independent to return home. Pt only has 24/7 lined up for 2 weeks for husband as of now. Discussed getting care for him longer at home. Pt will thinik about SNF option and prefers [facility] if she decides SNF. Pt states she has a 3in1 at home but may need a new RW.    OT Diagnosis:    OT Problem List:   OT Treatment Interventions:     OT Goals ADL Goals ADL Goal: Lower Body Dressing - Progress: Progressing toward goals ADL Goal: Toilet Transfer - Progress: Progressing toward goals  Visit Information  Last OT Received On: 07/14/11 Assistance Needed: +1    Subjective Data  Subjective: I am always thinking about my husband Patient Stated Goal: to be independent and care for husband   Prior Functioning       Cognition  Overall Cognitive Status: Appears within functional limits for tasks assessed/performed Arousal/Alertness: Awake/alert Orientation Level: Appears intact for tasks assessed Behavior During Session: Other (comment) (tearful at times talking about husband's care at home)    Mobility Bed Mobility Bed Mobility: Supine to Sit Supine to Sit: 3: Mod assist Details for Bed Mobility Assistance: cues for hand placement and technique to adhere to THPs Transfers Transfers: Sit to Stand;Stand to Sit Sit to Stand: 4: Min guard;With upper extremity assist;From bed Stand to Sit: 4: Min guard;With upper extremity assist;To chair/3-in-1 Details for Transfer Assistance: min verbal cues for hand placement and R LE out in front   Exercises    Balance    End of Session OT - End of Session Activity  Tolerance: Patient tolerated treatment well Patient left: in chair;with call bell/phone within reach  GO     Lennox Laity [phone] 07/14/2011, 10:02 AM

## 2011-07-14 NOTE — Progress Notes (Addendum)
Physical Therapy Treatment Patient Details Name: Diane Lucas MRN: [id number] DOB: [birth year] Today's Date: 07/14/2011 Time: 4696-2952 PT Time Calculation (min): 25 min  PT Assessment / Plan / Recommendation Comments on Treatment Session  Good progress with increased gait distance and pt educated on exercises for LE strengthening.    Follow Up Recommendations  Home health PT    Barriers to Discharge        Equipment Recommendations  Rolling walker with 5" wheels;None recommended by OT    Recommendations for Other Services    Frequency 7X/week   Plan Discharge plan remains appropriate;Frequency remains appropriate    Precautions / Restrictions Precautions Precautions: Posterior Hip Precaution Booklet Issued: Yes (comment) Precaution Comments: Pt able to recall 2/3 posterior hip precautions without cues. Reeducated pt on all 3 hip precautions.  Restrictions Weight Bearing Restrictions: No RLE Weight Bearing: Weight bearing as tolerated   Pertinent Vitals/Pain *3/10 R hip with activity Ice applied, BLEs elevated*    Mobility   Transfers Transfers: Sit to Stand;Stand to Sit Sit to Stand: 4: Min guard;From chair/3-in-1;With upper extremity assist;With armrests Stand to Sit: 4: Min guard;With upper extremity assist;To chair/3-in-1 Details for Transfer Assistance: cues for hand and left LE placement for safety with transfers. Ambulation/Gait Ambulation/Gait Assistance: 4: Min guard Ambulation Distance (Feet): 160 Feet Assistive device: Rolling walker Ambulation/Gait Assistance Details: no LOB Gait Pattern: Step-through pattern    Exercises Total Joint Exercises Ankle Circles/Pumps: AROM;Both;10 reps;Supine Short Arc Quad: AAROM;Strengthening;10 reps;Supine;Right Heel Slides: AAROM;Strengthening;10 reps;Supine;Right Hip ABduction/ADduction: AAROM;Strengthening;10 reps;Supine;Right Straight Leg Raises: AAROM;Strengthening;Right;10 reps;Supine   PT Diagnosis:    PT  Problem List:   PT Treatment Interventions:     PT Goals Acute Rehab PT Goals PT Goal Formulation: With patient Time For Goal Achievement: 07/18/11 Potential to Achieve Goals: Good Pt will go Supine/Side to Sit: Independently;with HOB 0 degrees Pt will go Sit to Stand: with modified independence PT Goal: Sit to Stand - Progress: Progressing toward goal Pt will Ambulate: >150 feet;with modified independence;with rolling walker PT Goal: Ambulate - Progress: Progressing toward goal Pt will Perform Home Exercise Program: with supervision, verbal cues required/provided PT Goal: Perform Home Exercise Program - Progress: Progressing toward goal  Visit Information  Last PT Received On: 07/14/11 Assistance Needed: +1    Subjective Data  Subjective: I've decided to go home.  My husband will have to know that I just can't do anything for him.  Patient Stated Goal: return to being able to care for her husband   Cognition  Overall Cognitive Status: Appears within functional limits for tasks assessed/performed Arousal/Alertness: Awake/alert Orientation Level: Appears intact for tasks assessed Behavior During Session: [facility] for tasks performed    Balance     End of Session PT - End of Session Equipment Utilized During Treatment: Gait belt Activity Tolerance: Patient tolerated treatment well Patient left: with call bell/phone within reach;in chair Nurse Communication: Mobility status;Precautions   GP     Ralene Bathe Kistler 07/14/2011, 11:20 AM [phone]

## 2011-07-14 NOTE — Progress Notes (Signed)
    Subjective: 2 Days Post-Op Procedure(s) (LRB): ACETABULAR REVISION (Right) Patient reports pain as 3 on 0-10 scale.   Slept much better last night; feels well this morning Denies CP or SOB.   Objective: Vital signs in last 24 hours: Temp:  [97.6 F (36.4 C)-98.1 F (36.7 C)] 97.8 F (36.6 C) (06/30 0511) Pulse Rate:  [63-75] 63  (06/30 0511) Resp:  [16-24] 18  (06/30 0511) BP: (112-132)/(62-72) 132/72 mmHg (06/30 0511) SpO2:  [97 %-100 %] 98 % (06/30 0511)  Intake/Output from previous day: 06/29 0701 - 06/30 0700 In: 2115 [P.O.:240; I.V.:1875] Out: 2825 [Urine:2825] Intake/Output this shift:    Labs:  [facility] 07/14/11 0415 07/13/11 0407  HGB 10.4* 11.6*    Basename 07/14/11 0415 07/13/11 0407  WBC 14.9* 13.1*  RBC 3.55* 4.00  HCT 31.6* 34.9*  PLT 72* 67*    Basename 07/14/11 0415 07/13/11 0407  NA 141 138  K 4.1 3.9  CL 106 104  CO2 26 24  BUN 20 20  CREATININE 0.87 0.84  GLUCOSE 128* 153*  CALCIUM 8.8 8.9   No results found for this basename: LABPT:2,INR:2 in the last 72 hours  Physical Exam: Sensation intact distally Intact pulses distally Dorsiflexion/Plantar flexion intact Incision: dressing C/D/I Compartment soft  Assessment/Plan: 2 Days Post-Op Procedure(s) (LRB): ACETABULAR REVISION (Right) Continue care per protocol DC foley per Dr. Lequita Halt note DC likely for tomorrow  [place] for Dr. Venita Lick [facility] [phone] 07/14/2011, 8:05 AM

## 2011-07-14 NOTE — Progress Notes (Addendum)
Cm spoke with patient concerning dc planning. Per pt SNF is better choice than Home with [facility] services due to spouse unable to assist with home care. Cm to notify CSW for further assistance.Leonie Green [phone]

## 2011-07-15 LAB — CBC
HCT: 29.6 % — ABNORMAL LOW (ref 36.0–46.0)
Hemoglobin: 9.6 g/dL — ABNORMAL LOW (ref 12.0–15.0)
MCH: 28.9 pg (ref 26.0–34.0)
MCHC: 32.4 g/dL (ref 30.0–36.0)
MCV: 89.2 fL (ref 78.0–100.0)
RDW: 12.7 % (ref 11.5–15.5)

## 2011-07-15 MED ORDER — RIVAROXABAN 10 MG PO TABS: 10.0000 mg | ORAL_TABLET | Freq: Every day | ORAL | Status: DC

## 2011-07-15 MED ORDER — METHOCARBAMOL 500 MG PO TABS: 500.0000 mg | ORAL_TABLET | Freq: Four times a day (QID) | ORAL | Status: AC | PRN

## 2011-07-15 MED ORDER — OXYCODONE HCL 5 MG PO TABS: 5.0000 mg | ORAL_TABLET | ORAL | Status: AC | PRN

## 2011-07-15 NOTE — Progress Notes (Signed)
Physical Therapy Treatment Patient Details Name: Diane Lucas MRN: [id number] DOB: [birth year] Today's Date: 07/15/2011 Time: 4098-1191 PT Time Calculation (min): 24 min  PT Assessment / Plan / Recommendation Comments on Treatment Session  Pt progressing well with mobility. Supervision for transfers and for walking 160' with RW. Minimal pain. Reviewed hip precautions.     Follow Up Recommendations  Home health PT    Barriers to Discharge        Equipment Recommendations  Rolling walker with 5" wheels;None recommended by OT    Recommendations for Other Services    Frequency 7X/week   Plan Discharge plan remains appropriate;Frequency remains appropriate    Precautions / Restrictions Precautions Precautions: Posterior Hip Precaution Comments: able to recall THPs but reinforced with functional activity Restrictions Weight Bearing Restrictions: No RLE Weight Bearing: Weight bearing as tolerated   Pertinent Vitals/Pain *4/10 R hip with exercises Ice applied, LEs elevated**    Mobility  Bed Mobility Bed Mobility: Supine to Sit Supine to Sit: 4: Min assist Details for Bed Mobility Assistance: cues for hand placement and technique to adhere to THPs Transfers Sit to Stand: With upper extremity assist;5: Supervision;From chair/3-in-1;With armrests Stand to Sit: With upper extremity assist;To chair/3-in-1;5: Supervision;With armrests Details for Transfer Assistance: min verbal cues for hand placement and R LE out in front Ambulation/Gait Ambulation/Gait Assistance: 5: Supervision Ambulation Distance (Feet): 160 Feet Assistive device: Rolling walker Ambulation/Gait Assistance Details: Pt doing well with mobility. Safe/steady gait. Minimal pain. Ready to DC home from PT standpoint. Gait Pattern: Step-through pattern    Exercises Total Joint Exercises Ankle Circles/Pumps: AROM;Both;10 reps;Supine Quad Sets: Supine;10 reps;AROM;Both Short Arc Quad: AAROM;Strengthening;Supine;Right;15  reps Heel Slides: AAROM;Strengthening;Supine;Right;15 reps Hip ABduction/ADduction: AAROM;Strengthening;Supine;Right;15 reps   PT Diagnosis:    PT Problem List:   PT Treatment Interventions:     PT Goals Acute Rehab PT Goals PT Goal Formulation: With patient Time For Goal Achievement: 07/18/11 Potential to Achieve Goals: Good Pt will go Supine/Side to Sit: Independently;with HOB 0 degrees Pt will go Sit to Stand: with modified independence PT Goal: Sit to Stand - Progress: Progressing toward goal Pt will Ambulate: >150 feet;with modified independence;with rolling walker PT Goal: Ambulate - Progress: Progressing toward goal Pt will Perform Home Exercise Program: with supervision, verbal cues required/provided PT Goal: Perform Home Exercise Program - Progress: Progressing toward goal  Visit Information  Last PT Received On: 07/15/11 Assistance Needed: +1    Subjective Data  Subjective: I'm ready to go home! Patient Stated Goal: DC home   Cognition  Overall Cognitive Status: Appears within functional limits for tasks assessed/performed Arousal/Alertness: Awake/alert Orientation Level: Appears intact for tasks assessed Behavior During Session: [facility] for tasks performed    Balance     End of Session PT - End of Session Equipment Utilized During Treatment: Gait belt Activity Tolerance: Patient tolerated treatment well Patient left: with call bell/phone within reach;in chair Nurse Communication: Mobility status;Precautions   GP     Ralene Bathe Kistler 07/15/2011, 11:09 AM [phone]

## 2011-07-15 NOTE — Discharge Instructions (Addendum)
Dr. Gaynelle Arabian Total Joint Specialist [facility] [address], [place], [place] [address] [phone]   TOTAL HIP REPLACEMENT / REVISION POSTOPERATIVE DIRECTIONS    Hip Rehabilitation, Guidelines Following Surgery  The results of a hip operation are greatly improved after range of motion and muscle strengthening exercises. Follow all safety measures which are given to protect your hip. If any of these exercises cause increased pain or swelling in your joint, decrease the amount until you are comfortable again. Then slowly increase the exercises. Call your caregiver if you have problems or questions.  HOME CARE INSTRUCTIONS  Most of the following instructions are designed to prevent the dislocation of your new hip.  Remove items at home which could result in a fall. This includes throw rugs or furniture in walking pathways.  Continue medications as instructed at time of discharge.  You may have some home medications which will be placed on hold until you complete the course of blood thinner medication.  You may start showering once you are discharged home but do not submerge the incision under water. Just pat the incision dry and apply a dry gauze dressing on daily. Do not put on socks or shoes without following the instructions of your caregivers.  Sit on high chairs so your hips are not bent more than 90 degrees.  Sit on chairs with arms. Use the chair arms to help push yourself up when arising.  Keep your leg on the side of the operation out in front of you when standing up.  Arrange for the use of a toilet seat elevator so you are not sitting low.  Do not do any exercises or get in any positions that cause your toes to point in (pigeon toed).  Always sleep with a pillow between your legs. Do not lie on your side in sleep with both knees touching the bed.   Walk with walker as instructed.  You may resume a sexual relationship in one month or when given  the OK by your caregiver.  Use walker as long as suggested by your caregivers.  Begin partial weight bearing after surgery but do not advance weight bearing status until approved by your surgeon.  Avoid periods of inactivity such as sitting longer than an hour when not asleep. This helps prevent blood clots.  You may return to work once you are cleared by Engineer, production.  Do not drive a car for 6 weeks or until released by your surgeon.  Do not drive while taking narcotics.  Wear elastic stockings for three weeks following surgery during the day but you may remove then at night.  Make sure you keep all of your appointments after your operation with all of your doctors and caregivers.  Change the dressing daily and reapply a dry dressing each time. Please pick up a stool softener and laxative for home use as long as you are requiring pain medications.  Continue to use ice on the hip for pain and swelling from surgery. You may notice swelling that will progress down to the foot and ankle.  This is normal after  surgery.  Elevate the leg when you are not up walking on it.   It is important for you to complete the blood thinner medication as prescribed by your doctor.  Continue to use the breathing machine which will help keep your temperature  down.  It is common for your temperature to cycle up and down following surgery, especially at night when  you are not up moving around and exerting yourself.  The breathing machine keeps your lungs expanded and your temperature down.  RANGE OF MOTION AND STRENGTHENING EXERCISES  These exercises are designed to help you keep full movement of your hip joint. Follow your caregiver's or physical therapist's instructions. Perform all exercises about fifteen times, three times per day or as directed. Exercise both hips, even if you have had only one joint replacement. These exercises can be done on a training (exercise) mat, on the floor, on a table or on a bed. Use  whatever works the best and is most comfortable for you. Use music or television while you are exercising so that the exercises are a pleasant break in your day. This will make your life better with the exercises acting as a break in routine you can look forward to.  Lying on your back, slowly slide your foot toward your buttocks, raising your knee up off the floor. Then slowly slide your foot back down until your leg is straight again.  Lying on your back spread your legs as far apart as you can without causing discomfort.  Lying on your side, raise your upper leg and foot straight up from the floor as far as is comfortable. Slowly lower the leg and repeat.  Lying on your back, tighten up the muscle in the front of your thigh (quadriceps muscles). You can do this by keeping your leg straight and trying to raise your heel off the floor. This helps strengthen the largest muscle supporting your knee.  Lying on your back, tighten up the muscles of your buttocks both with the legs straight and with the knee bent at a comfortable angle while keeping your heel on the floor.   SKILLED REHAB INSTRUCTIONS: If the patient is transferred to a skilled rehab facility following release from the hospital, a list of the current medications will be sent to the facility for the patient to continue.  When discharged from the skilled rehab facility, please have the facility set up the patient's Home Health Physical Therapy prior to being released. Also, the skilled facility will be responsible for providing the patient with their medications at time of release from the facility to include their pain medication, the muscle relaxants, and their blood thinner medication. If the patient is still at the rehab facility at time of the two week follow up appointment, the skilled rehab facility will also need to assist the patient in arranging follow up appointment in our office and any transportation needs.  MAKE SURE YOU:    Understand these instructions.  Will watch your condition.  Will get help right away if you are not doing well or get worse.  Document Released: 08/04/2003 Document Revised: 12/20/2010 Document Reviewed: 07/22/2007  [facility] Patient Information 2012 [place], [place].  Take Xarelto for two and a half more weeks, then discontinue Xarelto.

## 2011-07-15 NOTE — Progress Notes (Signed)
Subjective: 3 Days Post-Op Procedure(s) (LRB): ACETABULAR REVISION (Right) Patient reports pain as mild.   Patient seen in rounds by Dr. Lequita Halt. Patient is well, and has had no acute complaints or problems Patient is ready to go home.  Objective: Vital signs in last 24 hours: Temp:  [97.9 F (36.6 C)-99.1 F (37.3 C)] 98.4 F (36.9 C) (07/01 0612) Pulse Rate:  [67-75] 74  (07/01 0612) Resp:  [16-18] 16  (07/01 0612) BP: (104-111)/(61-68) 111/68 mmHg (07/01 0612) SpO2:  [94 %-99 %] 98 % (07/01 0612)  Intake/Output from previous day:  Intake/Output Summary (Last 24 hours) at 07/15/11 0715 Last data filed at 07/15/11 8469  Gross per 24 hour  Intake   1095 ml  Output   2475 ml  Net  -1380 ml    Intake/Output this shift:    Labs:  Basename 07/15/11 0355 07/14/11 0415 07/13/11 0407  HGB 9.6* 10.4* 11.6*    Basename 07/15/11 0355 07/14/11 0415  WBC 12.5* 14.9*  RBC 3.32* 3.55*  HCT 29.6* 31.6*  PLT 80* 72*    Basename 07/14/11 0415 07/13/11 0407  NA 141 138  K 4.1 3.9  CL 106 104  CO2 26 24  BUN 20 20  CREATININE 0.87 0.84  GLUCOSE 128* 153*  CALCIUM 8.8 8.9   No results found for this basename: LABPT:2,INR:2 in the last 72 hours  EXAM: General - Patient is Alert, Appropriate and Oriented Extremity - Neurovascular intact Sensation intact distally Dorsiflexion/Plantar flexion intact Incision - clean, dry, no drainage, healing Motor Function - intact, moving foot and toes well on exam.   Assessment/Plan: 3 Days Post-Op Procedure(s) (LRB): ACETABULAR REVISION (Right) Procedure(s) (LRB): ACETABULAR REVISION (Right) Past Medical History  Diagnosis Date  . Hypertension   . Elevated cholesterol   . Arthritis   . GERD (gastroesophageal reflux disease)   . Thyroid condition   . Vertigo   . Sleep apnea     STOP BANG SCORE 4  . Platelets decreased    Principal Problem:  *Failed total hip arthroplasty   Discharge home with home health Diet - Cardiac  diet Follow up - in 2 weeks Activity - WBAT Disposition - Home Condition Upon Discharge - Good D/C Meds - See DC Summary DVT Prophylaxis - Xarelto  Brysen Shankman 07/15/2011, 7:15 AM

## 2011-07-15 NOTE — Clinical Documentation Improvement (Signed)
Anemia Blood Loss Clarification  THIS DOCUMENT IS NOT A PERMANENT PART OF THE MEDICAL RECORD  RESPOND TO THE THIS QUERY, FOLLOW THE INSTRUCTIONS BELOW:  1. If needed, update documentation for the patient's encounter via the notes activity.  2. Access this query again and click edit on the In [employer].  3. After updating, or not, click F2 to complete all highlighted (required) fields concerning your review. Select "additional documentation in the medical record" OR "no additional documentation provided".  4. Click Sign note button.  5. The deficiency will fall out of your In Basket *Please let us know if you are not able to complete this workflow by phone or e-mail (listed below).        07/15/11  Dear [facility]  In an effort to better capture your patient's severity of illness, reflect appropriate length of stay and utilization of resources, a review of the patient medical record has revealed the following indicators.    Based on your clinical judgment, please clarify and document in a progress note and/or discharge summary the clinical condition associated with the following supporting information:  In responding to this query please exercise your independent judgment.  The fact that a query is asked, does not imply that any particular answer is desired or expected.  Pt admitted for RIGHT HIP ACETABULAR POLYETHYLENE REVISION  Post op H/H=9.6/29.6 in setting of post op Right hip revision  Please clarify the underlying diagnosis responsible for the abnormal H/H and document in pn or d/c summary.  Thank you for all that you do for our patients!    ossible Clinical Conditions?   " Expected Acute Blood Loss Anemia  " Acute Blood Loss Anemia  " Acute on chronic blood loss anemia    " Other Condition________________  " Cannot Clinically Determine  Risk Factors: (recent surgery, pre op anemia, EBL in OR)  Supporting Information:  Signs and Symptoms    RIGHT HIP ACETABULAR POLYETHYLENE REVISION/ H/O platelets decreased Diagnostics:  EBL=200 ml Component     Latest Ref Rng 07/13/2011 07/14/2011 07/15/2011  Hemoglobin     12.0 - 15.0 g/dL 16.1 (L) 09.6 (L) 9.6 (L)  HCT     36.0 - 46.0 % 34.9 (L) 31.6 (L) 29.6 (L)   Treatments: Transfusion: IV fluids: 0.9 % NaCl with KCl 20 mEq/ L infusion    Serial H&H monitoring   Reviewed:  no additional documentation provided ljh  Thank You,  Enis Slipper  RN, BSN, CCDS Clinical Documentation Specialist [facility] HIM Dept Pager: [phone] / E-mail: [email or web address]  Health Information Management [place]

## 2011-07-15 NOTE — Care Management Note (Signed)
    Page 1 of 2   07/15/2011     1:17:40 PM   CARE MANAGEMENT NOTE 07/15/2011  Patient:  TALLEY, CASCO   Account Number:  [id number]  Date Initiated:  07/15/2011  Documentation initiated by:  Colleen Can  Subjective/Objective Assessment:   dx rt hip steolysis with polywear: rt hip acetabular polyethelenrevision     Action/Plan:   Cm spoke with patient and plans are that she will return to her home in [place] were family members will be caregivers. Needs RW and wants [facility] to provide [facility] services. S[pouse is active with [facility] services   Anticipated DC Date:  07/15/2011   Anticipated DC Plan:  HOME W HOME HEALTH SERVICES  In-house referral  NA      DC Planning Services  CM consult      PAC Choice  DURABLE MEDICAL EQUIPMENT  HOME HEALTH   Choice offered to / List presented to:  C-1 Patient   DME arranged  Levan Hurst      DME agency  [facility]     HH arranged  HH-2 PT      [facility] agency  [facility]   Status of service:  Completed, signed off Medicare Important Message given?  NA - LOS <3 / Initial given by admissions (If response is "NO", the following Medicare IM given date fields will be blank) Date Medicare IM given:   Date Additional Medicare IM given:    Discharge Disposition:  HOME W HOME HEALTH SERVICES  Per UR Regulation:    If discussed at Long Length of Stay Meetings, dates discussed:    Comments:  07/15/2011 Raynelle Bring BSN CCM [phone] RW will be delivered to patient's home. [facility] will start services tomorrow 07/16/2011

## 2011-07-15 NOTE — Progress Notes (Signed)
Occupational Therapy Treatment Patient Details Name: Diane Lucas MRN: [id number] DOB: Jul 28, 1930 Today's Date: 07/15/2011 Time: 4098-1191 OT Time Calculation (min): 43 min  OT Assessment / Plan / Recommendation Comments on Treatment Session      Follow Up Recommendations  Home health OT;Supervision/Assistance - 24 hour;Skilled nursing facility    Barriers to Discharge       Equipment Recommendations  Rolling walker with 5" wheels;None recommended by OT    Recommendations for Other Services    Frequency Min 2X/week   Plan Frequency remains appropriate    Precautions / Restrictions Precautions Precautions: Posterior Hip Precaution Comments: able to recall THPs but reinforced with functional activity Restrictions Weight Bearing Restrictions: No RLE Weight Bearing: Weight bearing as tolerated   Pertinent Vitals/Pain No pain    ADL  Lower Body Bathing: Simulated;Performed;Supervision/safety (performed in shower and helped pt; later simulated AE with supervision) Where Assessed - Lower Body Bathing: Supported sit to stand Lower Body Dressing: Performed;Minimal assistance;Other (comment) (pants, socks with AE) Where Assessed - Lower Body Dressing: Supported sit to Pharmacist, hospital: Research scientist (life sciences) Method:  (ambulate) Acupuncturist: Raised toilet seat with arms (or 3-in-1 over toilet) Tub/Shower Transfer: Engineer, manufacturing Method: Science writer: Walk in Scientist, research (physical sciences) Used: Nurse, children's (3:1) Transfers/Ambulation Related to ADLs: supervision walking to bathroom:  min cues during turns for no internal rotation ADL Comments: Pt took a shower.  Would benefit from further OT to reinforce THPs during functional activities.  Pt planning to d/c home today with 24/7 CNA    OT Diagnosis:    OT Problem List:   OT Treatment Interventions:     OT Goals Acute  Rehab OT Goals Time For Goal Achievement: 07/20/11 ADL Goals Pt Will Perform Lower Body Bathing: with supervision;Sit to stand from chair;Sit to stand from bed ADL Goal: Lower Body Bathing - Progress: Met Pt Will Perform Lower Body Dressing: with supervision;with adaptive equipment;Sit to stand from bed;Sit to stand from chair ADL Goal: Lower Body Dressing - Progress: Progressing toward goals Pt Will Transfer to Toilet: with supervision;Ambulation;Comfort height toilet ADL Goal: Toilet Transfer - Progress: Met Pt Will Perform Tub/Shower Transfer: with supervision;Ambulation;Shower seat with back;Shower transfer ADL Goal: Web designer - Progress: Met  Visit Information  Last OT Received On: 07/15/11 Assistance Needed: +1    Subjective Data      Prior Functioning       Cognition  Overall Cognitive Status: Appears within functional limits for tasks assessed/performed Behavior During Session: [facility] for tasks performed    Mobility Bed Mobility Bed Mobility: Supine to Sit Supine to Sit: 4: Min assist Transfers Sit to Stand: From chair/3-in-1;From bed;With upper extremity assist;5: Supervision   Exercises    Balance    End of Session OT - End of Session Activity Tolerance: Patient tolerated treatment well Patient left: in chair;with call bell/phone within reach  GO     [facility] 07/15/2011, 9:31 AM Marica Otter, OTR/L [phone] 07/15/2011

## 2011-07-15 NOTE — Progress Notes (Signed)
Utilization review completed.  

## 2011-07-22 NOTE — Discharge Summary (Addendum)
Physician Discharge Summary   Patient ID: Diane Lucas MRN: [id number] DOB/AGE: 03/09/1930 78 y.o.  Admit date: 07/12/2011 Discharge date: 07/15/2011  Primary Diagnosis: Poly Wear of the Right Hip Prosthesis   Admission Diagnoses:  Past Medical History  Diagnosis Date  . Hypertension   . Elevated cholesterol   . Arthritis   . GERD (gastroesophageal reflux disease)   . Thyroid condition   . Vertigo   . Sleep apnea     STOP BANG SCORE 4  . Platelets decreased    Discharge Diagnoses:   Principal Problem:  *Failed total hip arthroplasty Expected blood loss anemia Procedure: Procedure(s) (LRB): ACETABULAR REVISION (Right)   Consults: None  HPI: Diane Lucas is a 78 year old female, who had a total hip arthroplasty done many years ago by Dr. Fannie Lucas. She in the past year has had episodes of pain which progressively got worse in her right groin. She had normal-appearing prosthesis with some osteolysis and eccentric polyethylene wear. No signs of any loosening of her metal components. MRI showed no evidence of any lytic cyst. She presents now for polyethylene versus total hip arthroplasty revision.  Laboratory Data: Hospital Outpatient Visit on 07/04/2011  Component Date Value Range Status  . MRSA, PCR 07/04/2011 NEGATIVE  NEGATIVE Final  . Staphylococcus aureus 07/04/2011 POSITIVE* NEGATIVE Final   Comment:                                 The Xpert SA Assay (FDA                          approved for NASAL specimens                          only), is one component of                          a comprehensive surveillance                          program.  It is not intended                          to diagnose infection nor to                          guide or monitor treatment.  [name] aPTT 07/04/2011 34  24 - 37 seconds Final  . WBC 07/04/2011 8.5  4.0 - 10.5 K/uL Final  . RBC 07/04/2011 4.51  3.87 - 5.11 MIL/uL Final  . Hemoglobin 07/04/2011 13.1  12.0 - 15.0 g/dL Final  . HCT  78/29/5621 40.5  36.0 - 46.0 % Final  . MCV 07/04/2011 89.8  78.0 - 100.0 fL Final  . MCH 07/04/2011 29.0  26.0 - 34.0 pg Final  . MCHC 07/04/2011 32.3  30.0 - 36.0 g/dL Final  . RDW 30/86/5784 12.5  11.5 - 15.5 % Final  . Platelets 07/04/2011 121* 150 - 400 K/uL Final   PLATELET COUNT CONFIRMED BY SMEAR  . Sodium 07/04/2011 140  135 - 145 mEq/L Final  . Potassium 07/04/2011 3.7  3.5 - 5.1 mEq/L Final  . Chloride 07/04/2011 99  96 - 112 mEq/L Final  . CO2 07/04/2011 32  19 -  32 mEq/L Final  . Glucose, Bld 07/04/2011 108* 70 - 99 mg/dL Final  . BUN 19/14/7829 22  6 - 23 mg/dL Final  . Creatinine, Ser 07/04/2011 0.92  0.50 - 1.10 mg/dL Final  . Calcium 56/21/3086 10.4  8.4 - 10.5 mg/dL Final  . Total Protein 07/04/2011 8.1  6.0 - 8.3 g/dL Final  . Albumin 57/84/6962 4.2  3.5 - 5.2 g/dL Final  . AST 95/28/4132 21  0 - 37 U/L Final  . ALT 07/04/2011 16  0 - 35 U/L Final  . Alkaline Phosphatase 07/04/2011 24* 39 - 117 U/L Final  . Total Bilirubin 07/04/2011 0.3  0.3 - 1.2 mg/dL Final  . GFR calc non Af Amer 07/04/2011 58* >90 mL/min Final  . GFR calc Af Amer 07/04/2011 67* >90 mL/min Final   Comment:                                 The eGFR has been calculated                          using the CKD EPI equation.                          This calculation has not been                          validated in all clinical                          situations.                          eGFR's persistently                          <90 mL/min signify                          possible Chronic Kidney Disease.  [name] Prothrombin Time 07/04/2011 13.3  11.6 - 15.2 seconds Final  . INR 07/04/2011 0.99  0.00 - 1.49 Final  . Color, Urine 07/04/2011 YELLOW  YELLOW Final  . APPearance 07/04/2011 CLEAR  CLEAR Final  . Specific Gravity, Urine 07/04/2011 1.016  1.005 - 1.030 Final  . pH 07/04/2011 6.5  5.0 - 8.0 Final  . Glucose, UA 07/04/2011 NEGATIVE  NEGATIVE mg/dL Final  . Hgb urine dipstick 07/04/2011  NEGATIVE  NEGATIVE Final  . Bilirubin Urine 07/04/2011 NEGATIVE  NEGATIVE Final  . Ketones, ur 07/04/2011 NEGATIVE  NEGATIVE mg/dL Final  . Protein, ur 44/01/270 NEGATIVE  NEGATIVE mg/dL Final  . Urobilinogen, UA 07/04/2011 0.2  0.0 - 1.0 mg/dL Final  . Nitrite 53/66/4403 NEGATIVE  NEGATIVE Final  . Leukocytes, UA 07/04/2011 NEGATIVE  NEGATIVE Final   MICROSCOPIC NOT DONE ON URINES WITH NEGATIVE PROTEIN, BLOOD, LEUKOCYTES, NITRITE, OR GLUCOSE <1000 mg/dL.   No results found for this basename: HGB:5 in the last 72 hours No results found for this basename: WBC:2,RBC:2,HCT:2,PLT:2 in the last 72 hours No results found for this basename: NA:2,K:2,CL:2,CO2:2,BUN:2,CREATININE:2,GLUCOSE:2,CALCIUM:2 in the last 72 hours No results found for this basename: LABPT:2,INR:2 in the last 72 hours  X-Rays:Dg Pelvis Portable  07/12/2011  *RADIOLOGY REPORT*  Clinical Data: Postoperative for total  hip prosthesis placement.  PORTABLE PELVIS  Comparison: 11/28/2010  Findings: Lower lumbar spondylosis noted.  The right total hip prosthesis is present with drain in place. Lucency/osteolysis noted in the greater trochanter and lateral subtrochanteric region adjacent to the proximal stem, query particulate disease. No lucency along the vast majority of the stem.  IMPRESSION:  1.  Lucency along the greater trochanter and subtrochanteric regions laterally, suspicious for osteolysis or particulate disease.  No significant abnormality of the acetabular cup component or adjacent bony acetabulum observed.  Original Report Authenticated By: Dellia Cloud, M.D.    EKG: Orders placed during the hospital encounter of 05/15/10  . EKG     Hospital Course: Patient was admitted to [facility] and taken to the OR and underwent the above state procedure without complications.  Patient tolerated the procedure well and was later transferred to the recovery room and then to the orthopaedic floor for postoperative care.   They were given PO and IV analgesics for pain control following their surgery.  They were given 24 hours of postoperative antibiotics and started on DVT prophylaxis in the form of Xarelto.   PT and OT were ordered for total hip protocol.  The patient was allowed to be WBAT with therapy. Discharge planning was consulted to help with postop disposition and equipment needs. Requested [facility] as they already come out to see her husband.  Patient had a good night on the evening of surgery and started to get up OOB with therapy on day one.  Hemovac drain was pulled without difficulty.  The Lucas immobilizer was removed and discontinued.  Continued to work with therapy into day two. Slept much better the second night and felt better.  Dressing was changed on day two and the incision was healing well.  By day three, the patient had progressed with therapy and meeting their goals.  Incision was healing well.  Patient was seen in rounds and was ready to go home.  Discharge Medications: Prior to Admission medications   Medication Sig Start Date End Date Taking? Authorizing Provider  diphenhydramine-acetaminophen (TYLENOL PM) 25-500 MG TABS Take 1 tablet by mouth at bedtime as needed. For sleep   Yes Historical Provider, MD  hydrochlorothiazide (HYDRODIURIL) 25 MG tablet Take 25 mg by mouth daily before breakfast.   Yes Historical Provider, MD  levothyroxine (SYNTHROID, LEVOTHROID) 50 MCG tablet Take 50 mcg by mouth daily before breakfast.   Yes Historical Provider, MD  omeprazole (PRILOSEC) 20 MG capsule Take 20 mg by mouth daily with breakfast.   Yes Historical Provider, MD  simvastatin (ZOCOR) 40 MG tablet Take 40 mg by mouth at bedtime.   Yes Historical Provider, MD  methocarbamol (ROBAXIN) 500 MG tablet Take 1 tablet (500 mg total) by mouth every 6 (six) hours as needed. 07/15/11 07/25/11  Alexzandrew Perkins, PA  oxyCODONE (OXY IR/ROXICODONE) 5 MG immediate release tablet Take 1-2 tablets (5-10 mg total)  by mouth every 4 (four) hours as needed for pain. 07/15/11 07/25/11  Alexzandrew Julien Girt, PA  rivaroxaban (XARELTO) 10 MG TABS tablet Take 1 tablet (10 mg total) by mouth daily with breakfast. Take Xarelto for two and a half more weeks, then discontinue Xarelto. 07/15/11   Alexzandrew Julien Girt, PA    Diet: Cardiac diet Activity:WBAT No bending hip over 90 degrees- A "L" Angle Do not cross legs Do not let foot roll inward When turning these patients a pillow should be placed between the patient's legs to prevent crossing. Patients should  have the affected Lucas fully extended when trying to sit or stand from all surfaces to prevent excessive hip flexion. When ambulating and turning toward the affected side the affected leg should have the toes turned out prior to moving the walker and the rest of patient's body as to prevent internal rotation/ turning in of the leg. Abduction pillows are the most effective way to prevent a patient from not crossing legs or turning toes in at rest. If an abduction pillow is not ordered placing a regular pillow length wise between the patient's legs is also an effective reminder. It is imperative that these precautions be maintained so that the surgical hip does not dislocate. Follow-up:in 2 weeks Disposition - Home Discharged Condition: good   Discharge Orders    Future Orders Please Complete By Expires   Diet general      Call MD / Call 911      Comments:   If you experience chest pain or shortness of breath, CALL 911 and be transported to the hospital emergency room.  If you develope a fever above 101 F, pus (white drainage) or increased drainage or redness at the wound, or calf pain, call your surgeon's office.   Discharge instructions      Comments:   Pick up stool softner and laxative for home. Do not submerge incision under water. May shower. Continue to use ice for pain and swelling from surgery. Hip precautions.  Total Hip Protocol.  Take Xarelto for  two and a half more weeks, then discontinue Xarelto.   Constipation Prevention      Comments:   Drink plenty of fluids.  Prune juice may be helpful.  You may use a stool softener, such as Colace (over the counter) 100 mg twice a day.  Use MiraLax (over the counter) for constipation as needed.   Increase activity slowly as tolerated      Patient may shower      Comments:   You may shower without a dressing once there is no drainage.  Do not wash over the wound.  If drainage remains, do not shower until drainage stops.   Driving restrictions      Comments:   No driving until released by the physician.   Lifting restrictions      Comments:   No lifting until released by the physician.   Follow the hip precautions as taught in Physical Therapy      Change dressing      Comments:   You may change your dressing dressing daily with sterile 4 x 4 inch gauze dressing and paper tape.  Do not submerge the incision under water.   TED hose      Comments:   Use stockings (TED hose) for 3 weeks on both leg(s).  You may remove them at night for sleeping.   Do not sit on low chairs, stoools or toilet seats, as it may be difficult to get up from low surfaces        Medication List  As of 07/22/2011 10:32 AM   STOP taking these medications         alendronate 70 MG tablet      Biotin 5000 MCG Caps      multivitamin with minerals Tabs         TAKE these medications         diphenhydramine-acetaminophen 25-500 MG Tabs   Commonly known as: TYLENOL PM   Take 1 tablet by mouth at bedtime  as needed. For sleep      hydrochlorothiazide 25 MG tablet   Commonly known as: HYDRODIURIL   Take 25 mg by mouth daily before breakfast.      levothyroxine 50 MCG tablet   Commonly known as: SYNTHROID, LEVOTHROID   Take 50 mcg by mouth daily before breakfast.      methocarbamol 500 MG tablet   Commonly known as: ROBAXIN   Take 1 tablet (500 mg total) by mouth every 6 (six) hours as needed.      omeprazole  20 MG capsule   Commonly known as: PRILOSEC   Take 20 mg by mouth daily with breakfast.      oxyCODONE 5 MG immediate release tablet   Commonly known as: Oxy IR/ROXICODONE   Take 1-2 tablets (5-10 mg total) by mouth every 4 (four) hours as needed for pain.      rivaroxaban 10 MG Tabs tablet   Commonly known as: XARELTO   Take 1 tablet (10 mg total) by mouth daily with breakfast. Take Xarelto for two and a half more weeks, then discontinue Xarelto.      simvastatin 40 MG tablet   Commonly known as: ZOCOR   Take 40 mg by mouth at bedtime.           Follow-up Information    Follow up with Loanne Drilling, MD. Schedule an appointment as soon as possible for a visit in 2 weeks.   Contact information:   [facility] [address] [place] [address] [phone]          Signed: Patrica Duel 07/22/2011, 10:32 AM

## 2011-10-29 ENCOUNTER — Other Ambulatory Visit: Payer: Self-pay | Admitting: Family Medicine

## 2011-10-29 DIAGNOSIS — Z1231 Encounter for screening mammogram for malignant neoplasm of breast: Secondary | ICD-10-CM

## 2011-12-26 ENCOUNTER — Ambulatory Visit: Payer: Medicare Other

## 2012-01-15 HISTORY — PX: BREAST SURGERY: SHX581

## 2012-01-15 HISTORY — PX: BREAST LUMPECTOMY: SHX2

## 2012-02-11 ENCOUNTER — Ambulatory Visit (INDEPENDENT_AMBULATORY_CARE_PROVIDER_SITE_OTHER): Payer: Medicare Other

## 2012-02-11 DIAGNOSIS — Z1231 Encounter for screening mammogram for malignant neoplasm of breast: Secondary | ICD-10-CM

## 2012-02-11 DIAGNOSIS — R928 Other abnormal and inconclusive findings on diagnostic imaging of breast: Secondary | ICD-10-CM

## 2012-02-13 ENCOUNTER — Other Ambulatory Visit: Payer: Self-pay | Admitting: Family Medicine

## 2012-02-13 DIAGNOSIS — R928 Other abnormal and inconclusive findings on diagnostic imaging of breast: Secondary | ICD-10-CM

## 2012-02-15 DIAGNOSIS — C50919 Malignant neoplasm of unspecified site of unspecified female breast: Secondary | ICD-10-CM

## 2012-02-15 HISTORY — DX: Malignant neoplasm of unspecified site of unspecified female breast: C50.919

## 2012-02-27 ENCOUNTER — Other Ambulatory Visit: Payer: Medicare Other

## 2012-03-02 ENCOUNTER — Other Ambulatory Visit: Payer: Self-pay | Admitting: Family Medicine

## 2012-03-02 ENCOUNTER — Ambulatory Visit
Admission: RE | Admit: 2012-03-02 | Discharge: 2012-03-02 | Disposition: A | Discharge status: Home / Self Care | Payer: Medicare Other | Source: Ambulatory Visit | Attending: Family Medicine | Admitting: Family Medicine

## 2012-03-02 DIAGNOSIS — R928 Other abnormal and inconclusive findings on diagnostic imaging of breast: Secondary | ICD-10-CM

## 2012-03-05 ENCOUNTER — Telehealth: Payer: Self-pay | Admitting: *Deleted

## 2012-03-05 DIAGNOSIS — C50411 Malignant neoplasm of upper-outer quadrant of right female breast: Secondary | ICD-10-CM | POA: Insufficient documentation

## 2012-03-05 NOTE — Telephone Encounter (Signed)
Confirmed [facility] for 03/11/12 at 0800 .  Instructions and contact information given.

## 2012-03-11 ENCOUNTER — Encounter: Payer: Self-pay | Admitting: Oncology

## 2012-03-11 ENCOUNTER — Ambulatory Visit: Payer: Medicare Other | Attending: Surgery | Admitting: Physical Therapy

## 2012-03-11 ENCOUNTER — Encounter (INDEPENDENT_AMBULATORY_CARE_PROVIDER_SITE_OTHER): Payer: Self-pay | Admitting: Surgery

## 2012-03-11 ENCOUNTER — Ambulatory Visit: Payer: Medicare Other

## 2012-03-11 ENCOUNTER — Ambulatory Visit
Admission: RE | Admit: 2012-03-11 | Discharge: 2012-03-11 | Disposition: A | Discharge status: Home / Self Care | Payer: Medicare Other | Source: Ambulatory Visit | Attending: Radiation Oncology | Admitting: Radiation Oncology

## 2012-03-11 ENCOUNTER — Ambulatory Visit (HOSPITAL_BASED_OUTPATIENT_CLINIC_OR_DEPARTMENT_OTHER): Payer: Medicare Other | Admitting: Oncology

## 2012-03-11 ENCOUNTER — Encounter: Payer: Self-pay | Admitting: *Deleted

## 2012-03-11 ENCOUNTER — Other Ambulatory Visit (HOSPITAL_BASED_OUTPATIENT_CLINIC_OR_DEPARTMENT_OTHER): Payer: Medicare Other | Admitting: Lab

## 2012-03-11 ENCOUNTER — Ambulatory Visit (HOSPITAL_BASED_OUTPATIENT_CLINIC_OR_DEPARTMENT_OTHER): Payer: Medicare Other | Admitting: Surgery

## 2012-03-11 VITALS — BP 168/79 | HR 79 | Temp 97.5°F | Resp 20 | Ht 62.0 in | Wt 181.4 lb

## 2012-03-11 DIAGNOSIS — C50411 Malignant neoplasm of upper-outer quadrant of right female breast: Secondary | ICD-10-CM

## 2012-03-11 DIAGNOSIS — IMO0001 Reserved for inherently not codable concepts without codable children: Secondary | ICD-10-CM | POA: Insufficient documentation

## 2012-03-11 DIAGNOSIS — Z170 Estrogen receptor positive status [ER+]: Secondary | ICD-10-CM

## 2012-03-11 DIAGNOSIS — C50419 Malignant neoplasm of upper-outer quadrant of unspecified female breast: Secondary | ICD-10-CM

## 2012-03-11 DIAGNOSIS — C50919 Malignant neoplasm of unspecified site of unspecified female breast: Secondary | ICD-10-CM

## 2012-03-11 DIAGNOSIS — R293 Abnormal posture: Secondary | ICD-10-CM | POA: Insufficient documentation

## 2012-03-11 DIAGNOSIS — C50911 Malignant neoplasm of unspecified site of right female breast: Secondary | ICD-10-CM

## 2012-03-11 DIAGNOSIS — M25619 Stiffness of unspecified shoulder, not elsewhere classified: Secondary | ICD-10-CM | POA: Insufficient documentation

## 2012-03-11 LAB — COMPREHENSIVE METABOLIC PANEL (CC13)
ALT: 20 U/L (ref 0–55)
AST: 21 U/L (ref 5–34)
Alkaline Phosphatase: 22 U/L — ABNORMAL LOW (ref 40–150)
BUN: 23.9 mg/dL (ref 7.0–26.0)
Creatinine: 1.4 mg/dL — ABNORMAL HIGH (ref 0.6–1.1)
Total Bilirubin: 0.37 mg/dL (ref 0.20–1.20)

## 2012-03-11 LAB — CBC WITH DIFFERENTIAL/PLATELET
BASO%: 0.3 % (ref 0.0–2.0)
EOS%: 0.8 % (ref 0.0–7.0)
LYMPH%: 26.9 % (ref 14.0–49.7)
MCH: 29.2 pg (ref 25.1–34.0)
MCHC: 33.2 g/dL (ref 31.5–36.0)
MCV: 87.9 fL (ref 79.5–101.0)
MONO#: 0.4 10*3/uL (ref 0.1–0.9)
MONO%: 6.5 % (ref 0.0–14.0)
Platelets: 82 10*3/uL — ABNORMAL LOW (ref 145–400)
RBC: 4.31 10*6/uL (ref 3.70–5.45)
WBC: 6.4 10*3/uL (ref 3.9–10.3)
nRBC: 0 % (ref 0–0)

## 2012-03-11 NOTE — Progress Notes (Signed)
Checked in new patient. No financial issues. She did not have [facility] form. I gave for her to fill out and turn in at [facility]

## 2012-03-11 NOTE — Progress Notes (Signed)
Patient ID: Diane Lucas, female   DOB: 10/20/1930, 79 y.o.   MRN: [id number]  No chief complaint on file.   HPI Diane Lucas is a 79 y.o. female.  Pt sent at the request of Dr Jackson for right breast mass.  Pt denies symptoms of breast mass,  Pain or discharge.  Mammography and U/S shows 4 mm x 4mm x 3 mm mass core biopsied to be IDC   ER/PR positive her 2 neu negative.   HPI  Past Medical History  Diagnosis Date  . Hypertension   . Elevated cholesterol   . Arthritis   . GERD (gastroesophageal reflux disease)   . Thyroid condition   . Vertigo   . Sleep apnea     STOP BANG SCORE 4  . Platelets decreased   . Breast cancer   . Back pain     Past Surgical History  Procedure Laterality Date  . Appendectomy    . Vein ligation and stripping      X2  . Hernia repair      ING HERNIA REPAIR  . Hip fracture surgery    . Joint replacement      RT HIP  . Dilation and curettage of uterus    . Abdominal hysterectomy    . Back surgery      Family History  Problem Relation Age of Onset  . Prostate cancer Brother   . Bladder Cancer Maternal Grandfather     Social History History  Substance Use Topics  . Smoking status: Never Smoker   . Smokeless tobacco: Not on file  . Alcohol Use: No    Allergies  Allergen Reactions  . Amlodipine Swelling  . Aspirin     Reaction=causes bleeding  . Benazepril     Tongue swelling  . Minocycline     swelling    Current Outpatient Prescriptions  Medication Sig Dispense Refill  . alendronate (FOSAMAX) 70 MG tablet Take 70 mg by mouth every 7 (seven) days. Take with a full glass of water on an empty stomach.      . BIOTIN 5000 PO Take by mouth daily. 10,000      . diphenhydramine-acetaminophen (TYLENOL PM) 25-500 MG TABS Take 1 tablet by mouth at bedtime as needed. For sleep      . hydrochlorothiazide (HYDRODIURIL) 25 MG tablet Take 25 mg by mouth daily before breakfast.      . levothyroxine (SYNTHROID, LEVOTHROID) 50 MCG tablet Take 50  mcg by mouth daily before breakfast.      . Multiple Vitamins-Minerals (CENTRUM SILVER PO) Take by mouth daily.      . omeprazole (PRILOSEC) 20 MG capsule Take 20 mg by mouth daily with breakfast.      . rivaroxaban (XARELTO) 10 MG TABS tablet Take 1 tablet (10 mg total) by mouth daily with breakfast. Take Xarelto for two and a half more weeks, then discontinue Xarelto.  18 tablet  0  . simvastatin (ZOCOR) 40 MG tablet Take 40 mg by mouth at bedtime.       No current facility-administered medications for this visit.    Review of Systems Review of Systems  Constitutional: Negative.   HENT: Negative.   Eyes: Negative.   Respiratory: Negative.   Cardiovascular: Negative.   Gastrointestinal: Negative.   Endocrine: Negative.   Genitourinary: Negative.   Allergic/Immunologic: Negative.   Neurological: Negative.   Hematological: Negative.   Psychiatric/Behavioral: Negative.     There were no vitals taken for this   visit.  Physical Exam Physical Exam  Constitutional: She is oriented to person, place, and time. She appears well-developed and well-nourished.  HENT:  Head: Normocephalic and atraumatic.  Eyes: EOM are normal. Pupils are equal, round, and reactive to light.  Neck: Normal range of motion. Neck supple.  Cardiovascular: Normal rate and regular rhythm.   Pulmonary/Chest: Effort normal and breath sounds normal. Right breast exhibits no inverted nipple, no mass, no nipple discharge, no skin change and no tenderness. Left breast exhibits no inverted nipple, no mass, no nipple discharge, no skin change and no tenderness. Breasts are symmetrical.  Musculoskeletal: Normal range of motion.  Lymphadenopathy:    She has no axillary adenopathy.  Neurological: She is alert and oriented to person, place, and time.  Skin: Skin is warm and dry.  Psychiatric: She has a normal mood and affect. Her behavior is normal. Judgment and thought content normal.    Data Reviewed RADIOLOGY REPORT*   Clinical Data: Recall from screening mammogram.  DIGITAL DIAGNOSTIC RIGHT BREAST MAMMOGRAM AND RIGHT BREAST  ULTRASOUND:  Comparison: 02/11/2012, 05/01/2009, 03/21/2008, 03/20/2007.  Findings:  ACR Breast Density Category 2: There is a scattered fibroglandular  pattern.  Additional views of the right breast demonstrate a small  irregularly marginated mass located within the upper inner quadrant  of the right breast at the 11 o'clock position with associated  pleomorphic calcifications. By mammography the mass measures 6 mm  in size.  On physical exam, there is no discrete palpable abnormality within  the upper inner quadrant of the right breast.  Ultrasound is performed, showing an irregularly marginated  hypoechoic mass which is vertically oriented and contains central  calcifications. This is associated with mild shadowing. This is  located at the 11 o'clock position within the right breast 13 cm  from nipple and measures 4 x 4 x 3 mm in size. This is worrisome  for a possible small invasive mammary carcinoma and tissue sampling  is recommended. I have discussed ultrasound-guided core biopsy of  the mass with the patient. The patient would like to proceed with  this at this time.  Ultrasound of the right axilla demonstrates no adenopathy.  IMPRESSION:  4 mm suspicious mass located within the right breast at the 11  o'clock position 13 cm from the nipple. Tissue sampling is  recommended and ultrasound-guided core biopsy will be performed and  reported separately.  RECOMMENDATION:  Right breast ultrasound guided core biopsy.   Assessment    Stage 1 right breast cancer    Plan    Pt wishes breast conservation.Discussed right breast lumpectomy with needle localization and SLN mapping. The procedure has been discussed with the patient. Alternatives to surgery have been discussed with the patient.  Risks of surgery include bleeding,  Infection,  Seroma formation, death,  and the  need for further surgery.   The patient understands and wishes to proceed.Sentinel lymph node mapping and dissection has been discussed with the patient.  Risk of bleeding,  Infection,  Seroma formation,  Additional procedures,,  Shoulder weakness ,  Shoulder stiffness,  Nerve and blood vessel injury and reaction to the mapping dyes have been discussed.  Alternatives to surgery have been discussed with the patient.  The patient agrees to proceed.       Thierry Dobosz A. 03/11/2012, 10:24 AM    

## 2012-03-11 NOTE — Patient Instructions (Addendum)
Sentinel Lymph Node Analysis in Breast Cancer Treatment WHAT IS A LYMPH NODE? Lymph nodes are little glands that lie along the lymph channels and serve to trap infections in the body. These are the small vessel-like structures that carry the fluid (lymph) from body tissues away to be filtered. These are the "glands" that feel swollen in the neck when you or your child has a sore throat. These glands in your armpit are where breast cancer tends to spread first. WHAT IS SENTINEL LYMPH NODE ANALYSIS? Sentinel lymph node study is a new cancer diagnostic procedure. It aims to look at the most likely first spread of breast cancer. It involves looking at the lymph node or nodes where breast cancer is likely to travel next.  PROCEDURE  A small amount of blue dye and radioactive tracer are injected around the tumor in the breast. The dye and tracer follow the same path that a spreading cancer would be likely to follow. With the use of a Geiger counter, the radioactive tracer can be located in the lymph node that is the gatekeeper to other lymph nodes in the armpit. The sentinel lymph node is the first lymph node in a chain of lymph nodes that drain the lymph from the breast. The blue dye enables the surgeon to identify this sentinel node. This node can be removed and examined. If no cancer is found in this node, no further removal of lymph nodes is recommended. In this case, the spread of cancer to the other lymph nodes is rare. This eliminates any more surgery in the armpit and risk of complications. If the lymph node shows spread of the cancer from the breast, the other lymph nodes in the armpit are removed and analyzed. This helps the doctor and patient decide how much more surgery is needed and if chemotherapy and/or radiation treatment is necessary following the surgery.  BENEFITS  The pathology tests for this procedure are much more sensitive than was previously available.  This technique is thought to be a  major improvement in the treatment of breast cancer.  This procedure allows many patients to avoid the effects of more extensive underarm lymph node removal and risk of complications (infection, bleeding, or severe arm swelling).  Survival rates from breast cancer are better and the risk of complications isreduced. Document Released: 12/31/2004 Document Revised: 03/25/2011 Document Reviewed: 09/16/2006 Exit[employer] Patient Information 2013 [employer]. Lumpectomy, Breast Conserving Surgery A lumpectomy is breast surgery that removes only part of the breast. Another name used may be partial mastectomy. The amount removed varies. Make sure you understand how much of your breast will be removed. Reasons for a lumpectomy:  Any solid breast mass.  Grouped significant nodularity that may be confused with a solitary breast mass. Lumpectomy is the most common form of breast cancer surgery today. The surgeon removes the portion of your breast which contains the tumor (cancer). This is the lump. Some normal tissue around the lump is also removed to be sure that all the tumor has been removed.  If cancer cells are found in the margins where the breast tissue was removed, your surgeon will do more surgery to remove the remaining cancer tissue. This is called re-excision surgery. Radiation and/or chemotherapy treatments are often given following a lumpectomy to kill any cancer cells that could possibly remain.  REASONS YOU MAY NOT BE ABLE TO HAVE BREAST CONSERVING SURGERY:  The tumor is located in more than one place.  Your breast is small and the   tumor is large so the breast would be disfigured.  The entire tumor removal is not successful with a lumpectomy.  You cannot commit to a full course of chemotherapy, radiation therapy or are pregnant and cannot have radiation.  You have previously had radiation to the breast to treat cancer. HOW A LUMPECTOMY IS PERFORMED If overnight nursing is not required  following a biopsy, a lumpectomy can be performed as a same-day surgery. This can be done in a hospital, clinic, or surgical center. The anesthesia used will depend on your surgeon. They will discuss this with you. A general anesthetic keeps you sleeping through the procedure. LET YOUR CAREGIVERS KNOW ABOUT THE FOLLOWING:  Allergies  Medications taken including herbs, eye drops, over the counter medications, and creams.  Use of steroids (by mouth or creams)  Previous problems with anesthetics or Novocaine.  Possibility of pregnancy, if this applies  History of blood clots (thrombophlebitis)  History of bleeding or blood problems.  Previous surgery  Other health problems BEFORE THE PROCEDURE You should be present one hour prior to your procedure unless directed otherwise.  AFTER THE PROCEDURE  After surgery, you will be taken to the recovery area where a nurse will watch and check your progress. Once you're awake, stable, and taking fluids well, barring other problems you will be allowed to go home.  Ice packs applied to your operative site may help with discomfort and keep the swelling down.  A small rubber drain may be placed in the breast for a couple of days to prevent a hematoma from developing in the breast.  A pressure dressing may be applied for 24 to 48 hours to prevent bleeding.  Keep the wound dry.  You may resume a normal diet and activities as directed. Avoid strenuous activities affecting the arm on the side of the biopsy site such as tennis, swimming, heavy lifting (more than 10 pounds) or pulling.  Bruising in the breast is normal following this procedure.  Wearing a bra - even to bed - may be more comfortable and also help keep the dressing on.  Change dressings as directed.  Only take over-the-counter or prescription medicines for pain, discomfort, or fever as directed by your caregiver. Call for your results as instructed by your surgeon. Remember it is  your responsibility to get the results of your lumpectomy if your surgeon asked you to follow-up. Do not assume everything is fine if you have not heard from your caregiver. SEEK MEDICAL CARE IF:   There is increased bleeding (more than a small spot) from the wound.  You notice redness, swelling, or increasing pain in the wound.  Pus is coming from wound.  An unexplained oral temperature above 102 F (38.9 C) develops.  You notice a foul smell coming from the wound or dressing. SEEK IMMEDIATE MEDICAL CARE IF:   You develop a rash.  You have difficulty breathing.  You have any allergic problems. Document Released: 02/11/2006 Document Revised: 03/25/2011 Document Reviewed: 05/15/2006 [employer] Patient Information 2013 [employer].  

## 2012-03-11 NOTE — Patient Instructions (Addendum)
Lumpectomy with SLN  I will see you back April 07 2012

## 2012-03-11 NOTE — Progress Notes (Signed)
Radiation Oncology         [phone]) [phone] ________________________________  Initial outpatient Consultation - Date: 03/11/2012   Name: Diane Lucas MRN: [id number]   DOB: Jan 28, 1930  REFERRING PHYSICIAN: Luisa Hart Clovis Pu., MD  DIAGNOSIS: The encounter diagnosis was Cancer of upper-outer quadrant of female breast, right.  HISTORY OF PRESENT ILLNESS::Diane Lucas is a 79 y.o. female  who underwent a screening mammogram. She was found to have an abnormality in the right breast. Ultrasound confirmed a 4 mm finding. A biopsy was performed which showed a grade 1-2 invasive ductal carcinoma which was ER/PR positive at 100% Ki-67 was 71% HER-2 was negative. She denies any palpable lesions. She has no prior history of breast cancer. She has 2 children. She used hormone replacement for several years but quit many years ago.  PREVIOUS RADIATION THERAPY: No  PAST MEDICAL HISTORY:  has a past medical history of Hypertension; Elevated cholesterol; Arthritis; GERD (gastroesophageal reflux disease); Thyroid condition; Vertigo; Sleep apnea; Platelets decreased; Breast cancer; and Back pain.    PAST SURGICAL HISTORY: Past Surgical History  Procedure Laterality Date  . Appendectomy    . Vein ligation and stripping      X2  . Hernia repair      ING HERNIA REPAIR  . Hip fracture surgery    . Joint replacement      RT HIP  . Dilation and curettage of uterus    . Abdominal hysterectomy    . Back surgery      FAMILY HISTORY: @[facility] @  SOCIAL HISTORY:  History  Substance Use Topics  . Smoking status: Never Smoker   . Smokeless tobacco: Not on file  . Alcohol Use: No    ALLERGIES: Amlodipine; Aspirin; Benazepril; and Minocycline  MEDICATIONS:  Current Outpatient Prescriptions  Medication Sig Dispense Refill  . alendronate (FOSAMAX) 70 MG tablet Take 70 mg by mouth every 7 (seven) days. Take with a full glass of water on an empty stomach.      [name] BIOTIN 5000 PO Take by mouth daily. 10,000      .  diphenhydramine-acetaminophen (TYLENOL PM) 25-500 MG TABS Take 1 tablet by mouth at bedtime as needed. For sleep      . hydrochlorothiazide (HYDRODIURIL) 25 MG tablet Take 25 mg by mouth daily before breakfast.      . levothyroxine (SYNTHROID, LEVOTHROID) 50 MCG tablet Take 50 mcg by mouth daily before breakfast.      . Multiple Vitamins-Minerals (CENTRUM SILVER PO) Take by mouth daily.      [name] omeprazole (PRILOSEC) 20 MG capsule Take 20 mg by mouth daily with breakfast.      . rivaroxaban (XARELTO) 10 MG TABS tablet Take 1 tablet (10 mg total) by mouth daily with breakfast. Take Xarelto for two and a half more weeks, then discontinue Xarelto.  18 tablet  0  . simvastatin (ZOCOR) 40 MG tablet Take 40 mg by mouth at bedtime.       No current facility-administered medications for this encounter.    REVIEW OF SYSTEMS:  A 15 point review of systems is documented in the electronic medical record. This was obtained by the nursing staff. However, I reviewed this with the patient to discuss relevant findings and make appropriate changes.  Pertinent items are noted in HPI.   PHYSICAL EXAM: There were no vitals filed for this visit.[name] He is a pleasant female who appears her stated age. She has large ptotic breasts bilaterally. She has no palpable axillary or  supraclavicular adenopathy. I'm really not able to palpate any biopsy change in the right breast. The left breast is unremarkable. She is alert and oriented x3.  LABORATORY DATA:  Lab Results  Component Value Date   WBC 6.4 03/11/2012   HGB 12.6 03/11/2012   HCT 37.9 03/11/2012   MCV 87.9 03/11/2012   PLT 82* 03/11/2012   Lab Results  Component Value Date   NA 142 03/11/2012   K 3.5 03/11/2012   CL 102 03/11/2012   CO2 29 03/11/2012   Lab Results  Component Value Date   ALT 20 03/11/2012   AST 21 03/11/2012   ALKPHOS 22* 03/11/2012   BILITOT 0.37 03/11/2012     RADIOGRAPHY: US Breast Right  03/09/2012  *RADIOLOGY REPORT*  Clinical Data:  Recall  from screening mammogram.  DIGITAL DIAGNOSTIC RIGHT BREAST MAMMOGRAM  AND RIGHT BREAST ULTRASOUND:  Comparison:  02/11/2012, 05/01/2009, 03/21/2008, 03/20/2007.  Findings:  ACR Breast Density Category 2: There is a scattered fibroglandular pattern.  Additional views of the right breast demonstrate a small irregularly marginated mass located within the upper inner quadrant of the right breast at the 11 o'clock position with associated pleomorphic calcifications.  By mammography the mass measures 6 mm in size.  On physical exam, there is no discrete palpable abnormality within the upper inner quadrant of the right breast.  Ultrasound is performed, showing an irregularly marginated hypoechoic mass which is vertically oriented and contains central calcifications. This is associated with mild shadowing.  This is located at the 11 o'clock position within the right breast 13 cm from nipple and measures 4 x 4 x 3 mm in size.  This is worrisome for a possible small invasive mammary carcinoma and tissue sampling is recommended.  I have discussed ultrasound-guided core biopsy of the mass with the patient. The patient would like to proceed with this at this time.  Ultrasound of the right axilla demonstrates no adenopathy.  IMPRESSION: 4 mm suspicious mass located within the right breast at the 11 o'clock position 13 cm from the nipple.  Tissue sampling is recommended and ultrasound-guided core biopsy will be performed and reported separately.  RECOMMENDATION: Right breast ultrasound guided core biopsy.  I have discussed the findings and recommendations with the patient. Results were also provided in writing at the conclusion of the visit.  BI-RADS CATEGORY 4:  Suspicious abnormality - biopsy should be considered.   Original Report Authenticated By: Rolla Plate, M.D.    Mm Digital Diag Ltd R  03/02/2012  *RADIOLOGY REPORT*  Clinical Data:  Recall from screening mammogram.  DIGITAL DIAGNOSTIC RIGHT BREAST MAMMOGRAM  AND  RIGHT BREAST ULTRASOUND:  Comparison:  02/11/2012, 05/01/2009, 03/21/2008, 03/20/2007.  Findings:  ACR Breast Density Category 2: There is a scattered fibroglandular pattern.  Additional views of the right breast demonstrate a small irregularly marginated mass located within the upper inner quadrant of the right breast at the 11 o'clock position with associated pleomorphic calcifications.  By mammography the mass measures 6 mm in size.  On physical exam, there is no discrete palpable abnormality within the upper inner quadrant of the right breast.  Ultrasound is performed, showing an irregularly marginated hypoechoic mass which is vertically oriented and contains central calcifications. This is associated with mild shadowing.  This is located at the 11 o'clock position within the right breast 13 cm from nipple and measures 4 x 4 x 3 mm in size.  This is worrisome for a possible small invasive mammary carcinoma and tissue sampling  is recommended.  I have discussed ultrasound-guided core biopsy of the mass with the patient. The patient would like to proceed with this at this time.  Ultrasound of the right axilla demonstrates no adenopathy.  IMPRESSION: 4 mm suspicious mass located within the right breast at the 11 o'clock position 13 cm from the nipple.  Tissue sampling is recommended and ultrasound-guided core biopsy will be performed and reported separately.  RECOMMENDATION: Right breast ultrasound guided core biopsy.  I have discussed the findings and recommendations with the patient. Results were also provided in writing at the conclusion of the visit.  BI-RADS CATEGORY 4:  Suspicious abnormality - biopsy should be considered.   Original Report Authenticated By: Rolla Plate, M.D.    Mm Digital Diagnostic Unilat R  03/02/2012  *RADIOLOGY REPORT*  Clinical Data:  Post right breast ultrasound guided core biopsy.  DIGITAL DIAGNOSTIC RIGHT BREAST MAMMOGRAM  Comparison:  Previous exams.  Findings:  Films are  performed following ultrasound guided biopsy of the small mass located within the right breast at the 11 o'clock position.  The cylinder shaped clip is in appropriate position.  IMPRESSION: Appropriate positioning of clip following right breast ultrasound guided core biopsy.   Original Report Authenticated By: Rolla Plate, M.D.    Mm Digital Screening  02/12/2012  *RADIOLOGY REPORT*  Clinical Data: Screening.  DIGITAL BILATERAL SCREENING MAMMOGRAM WITH CAD  Comparison:  Previous exams.  FINDINGS:  ACR Breast Density Category 2: There is a scattered fibroglandular pattern.  In the right breast, a possible mass warrants further evaluation with spot compression views and possibly ultrasound.  In the left breast, no masses or malignant type calcifications are identified.  Images were processed with CAD.  IMPRESSION: Further evaluation is suggested for possible mass in the right breast.  RECOMMENDATION: Diagnostic mammogram and possibly ultrasound of the right breast. (Code:[id number])  The patient will be contacted regarding the findings, and additional imaging will be scheduled.  BI-RADS CATEGORY 0:  Incomplete.  Need additional imaging evaluation and/or prior mammograms for comparison.   Original Report Authenticated By: Norva Pavlov, M.D.    [place] Rt Breast Bx W Loc Dev 1st Lesion Img Bx Spec US Guide  03/03/2012  **ADDENDUM** CREATED: 03/03/2012 12:06:06  Findings: Pathology returned as invasive mammary cancer.  This is concordant with imaging findings.  The patient was telephoned on 03/03/2012 at approximately 1145 hours and I discussed these results with her.  She denies pain or tenderness at the biopsy site.  RECOMMENDATION: The patient is scheduled for the Multidisciplinary Breast Cancer Clinic at the [facility] on 03/11/2012.  **END ADDENDUM** SIGNED BY: Arnell Sieving, M.D.   03/02/2012  *RADIOLOGY REPORT*  Clinical Data:  Right breast mass  ULTRASOUND GUIDED CORE BIOPSY OF THE  right BREAST  Comparison: Previous exams.  I met with the patient and we discussed the procedure of ultrasound- guided biopsy, including benefits and alternatives.  We discussed the high likelihood of a successful procedure. We discussed the risks of the procedure, including infection, bleeding, tissue injury, clip migration, and inadequate sampling.  Informed written consent was given.  Using sterile technique 2% lidocaine, ultrasound guidance and a 14 gauge automated biopsy device, biopsy was performed of the small mass located within the right breast at the 11 o'clock position using a medial approach.  At the conclusion of the procedure a cylinder shaped tissue marker clip was deployed into the biopsy cavity.  Follow up 2 view mammogram was performed and dictated separately.  IMPRESSION:  Ultrasound guided biopsy of the right breast mass located at the 11 o'clock position as discussed above.  No apparent complications.  Original Report Authenticated By: Rolla Plate, M.D.       IMPRESSION: T1N0 (Stage I) Invasive Breast Cancer  PLAN: I spoke to the patient and her daughter-in-law today. Her daughter-in-law is actually a Sports administrator at [employer]. We discussed the equivalency in terms of survival between mastectomy and lumpectomy. We discussed the traditional role of radiation and decreasing local failures in the treated breast. We discussed the process of simulation the placement tattoos. We discussed skin redness and fatigue as major acute side effects of radiation. We discussed randomized trial showing no survival benefit to radiation over hormonal and lumpectomy. I told her I thought her local failure rates were approximately equal between radiation alone or antiestrogen alone. We discussed that radiation is a local treatment while antiestrogen therapy could be systemic. 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 She met with a member of our patient family support staff as well as with physical therapy. I encouraged her to contact me if she elected to undergo radiation which I think is unlikely.  I spent 60 minutes  face to face with the patient and more than 50% of that time was spent in counseling and/or coordination of care.   ------------------------------------------------  Lurline Hare, MD

## 2012-03-11 NOTE — Progress Notes (Signed)
Diane Lucas [id number] 07-Jan-1931 79 y.o. 03/11/2012 10:56 AM  CC  Herb Grays, MD [address] [place] [address] Dr. Lurline Hare Dr. Harriette Bouillon  REASON FOR CONSULTATION:  79 year old female with new diagnosis of stage I right invasive ductal carcinoma. Patient was seen in the Multidisciplinary Breast Clinic for discussion of her treatment options.   STAGE:   Cancer of upper-outer quadrant of female breast   Primary site: Breast (Right)   Staging method: AJCC 7th Edition   Clinical: Stage IA (T1a, N0, cM0)   Summary: Stage IA (T1a, N0, cM0)  REFERRING PHYSICIAN: Dr. Maisie Fus Cornett  HISTORY OF PRESENT ILLNESS:  Diane Lucas is a 79 y.o. female.  With medical history significant for our do some hypertension and ostial process. Patient recently had a screening mammogram performed that revealed a 4 x 4 by 3 mm area of concern at the 11:00 position in the right breast. Patient went on to have a biopsy performed on 03/02/2002. The pathology revealed invasive mammary carcinoma with microcalcifications grade 1. Tumor was ER +100% PR +100% HER-2/neu negative with a proliferation marker Ki-67 71%. E-cadherin stain revealed the patient in fact had a ductal carcinoma phenotype. She is now seen in the [facility] clinic for discussion of treatment options. Her case was discussed at the multidisciplinary breast conference. Pathology and radiology were reviewed. She is without any complaints. Patient does have a prior hematologic history significant for thrombocytopenia she has been seen by one of my partners Dr. Shirline Frees. 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   Past Medical History: Past Medical History  Diagnosis Date  . Hypertension   . Elevated cholesterol   . Arthritis   . GERD (gastroesophageal reflux disease)   . Thyroid condition   . Vertigo   . Sleep apnea     STOP BANG SCORE 4  . Platelets decreased    . Breast cancer   . Back pain     Past Surgical History: Past Surgical History  Procedure Laterality Date  . Appendectomy    . Vein ligation and stripping      X2  . Hernia repair      ING HERNIA REPAIR  . Hip fracture surgery    . Joint replacement      RT HIP  . Dilation and curettage of uterus    . Abdominal hysterectomy    . Back surgery      Family History: Family History  Problem Relation Age of Onset  . Prostate cancer Brother   . Bladder Cancer Maternal Grandfather     Social History History  Substance Use Topics  . Smoking status: Never Smoker   . Smokeless tobacco: Not on file  . Alcohol Use: No    Allergies: Allergies  Allergen Reactions  . Amlodipine Swelling  . Aspirin     Reaction=causes bleeding  . Benazepril     Tongue swelling  . Minocycline     swelling    Current Medications: Current Outpatient Prescriptions  Medication Sig Dispense Refill  . alendronate (FOSAMAX) 70 MG tablet Take 70 mg by mouth every 7 (seven) days. Take with a full glass of water on an empty stomach.      [name] BIOTIN 5000 PO Take by mouth daily. 10,000      . diphenhydramine-acetaminophen (TYLENOL PM) 25-500 MG TABS Take 1 tablet by mouth at bedtime as needed. For sleep      .  hydrochlorothiazide (HYDRODIURIL) 25 MG tablet Take 25 mg by mouth daily before breakfast.      . levothyroxine (SYNTHROID, LEVOTHROID) 50 MCG tablet Take 50 mcg by mouth daily before breakfast.      . Multiple Vitamins-Minerals (CENTRUM SILVER PO) Take by mouth daily.      [name] omeprazole (PRILOSEC) 20 MG capsule Take 20 mg by mouth daily with breakfast.      . simvastatin (ZOCOR) 40 MG tablet Take 40 mg by mouth at bedtime.      . rivaroxaban (XARELTO) 10 MG TABS tablet Take 1 tablet (10 mg total) by mouth daily with breakfast. Take Xarelto for two and a half more weeks, then discontinue Xarelto.  18 tablet  0   No current facility-administered medications for this visit.    OB/GYN  History:menarche at age 39 or 60. Patient is postmenopausal she did receive hormone replacement therapy for approximately 8 years she discontinued it many years ago. She has had 2 term births first live birth was at 75.  Fertility Discussion:not applicable Prior History of Cancer:no prior history of cancer  Health Maintenance:  Colonoscopy2009 Bone Density2008 Last PAP smear2011  ECOG PERFORMANCE STATUS: 1 - Symptomatic but completely ambulatory  Genetic Counseling/testing:patient's paternal grandfather had bladder cancer her brother had prostate cancer sister was living at female cancers one brother living with prostate cancer.  REVIEW OF SYSTEMS:  A comprehensive review of systems was negative.  PHYSICAL EXAMINATION: Blood pressure 168/79, pulse 79, temperature 97.5 F (36.4 C), temperature source Oral, resp. rate 20, height 5\' 2"  (1.575 m), weight 181 lb 6.4 oz (82.283 kg).  WNU:UVOZD, healthy, no distress, well nourished and well developed SKIN: skin color, texture, turgor are normal HEAD: Normocephalic EYES: PERRLA, EOMI EARS: External ears normal OROPHARYNX:no exudate and no erythema  NECK: supple, no adenopathy LYMPH:  no palpable lymphadenopathy BREAST:left breast normal without mass, skin or nipple changes or axillary nodes, abnormal mass palpable in the right breast from the biopsy hematoma. LUNGS: clear to auscultation  HEART: regular rate & rhythm ABDOMEN:abdomen soft, normal bowel sounds and no masses or organomegaly BACK: No CVA tenderness EXTREMITIES:no edema, no clubbing, no cyanosis  NEURO: alert & oriented x 3 with fluent speech, no focal motor/sensory deficits, gait normal     STUDIES/RESULTS: US Breast Right  03/09/2012  *RADIOLOGY REPORT*  Clinical Data:  Recall from screening mammogram.  DIGITAL DIAGNOSTIC RIGHT BREAST MAMMOGRAM  AND RIGHT BREAST ULTRASOUND:  Comparison:  02/11/2012, 05/01/2009, 03/21/2008, 03/20/2007.  Findings:  ACR Breast Density  Category 2: There is a scattered fibroglandular pattern.  Additional views of the right breast demonstrate a small irregularly marginated mass located within the upper inner quadrant of the right breast at the 11 o'clock position with associated pleomorphic calcifications.  By mammography the mass measures 6 mm in size.  On physical exam, there is no discrete palpable abnormality within the upper inner quadrant of the right breast.  Ultrasound is performed, showing an irregularly marginated hypoechoic mass which is vertically oriented and contains central calcifications. This is associated with mild shadowing.  This is located at the 11 o'clock position within the right breast 13 cm from nipple and measures 4 x 4 x 3 mm in size.  This is worrisome for a possible small invasive mammary carcinoma and tissue sampling is recommended.  I have discussed ultrasound-guided core biopsy of the mass with the patient. The patient would like to proceed with this at this time.  Ultrasound of the right axilla demonstrates no  adenopathy.  IMPRESSION: 4 mm suspicious mass located within the right breast at the 11 o'clock position 13 cm from the nipple.  Tissue sampling is recommended and ultrasound-guided core biopsy will be performed and reported separately.  RECOMMENDATION: Right breast ultrasound guided core biopsy.  I have discussed the findings and recommendations with the patient. Results were also provided in writing at the conclusion of the visit.  BI-RADS CATEGORY 4:  Suspicious abnormality - biopsy should be considered.   Original Report Authenticated By: Rolla Plate, M.D.    Mm Digital Diag Ltd R  03/02/2012  *RADIOLOGY REPORT*  Clinical Data:  Recall from screening mammogram.  DIGITAL DIAGNOSTIC RIGHT BREAST MAMMOGRAM  AND RIGHT BREAST ULTRASOUND:  Comparison:  02/11/2012, 05/01/2009, 03/21/2008, 03/20/2007.  Findings:  ACR Breast Density Category 2: There is a scattered fibroglandular pattern.  Additional views  of the right breast demonstrate a small irregularly marginated mass located within the upper inner quadrant of the right breast at the 11 o'clock position with associated pleomorphic calcifications.  By mammography the mass measures 6 mm in size.  On physical exam, there is no discrete palpable abnormality within the upper inner quadrant of the right breast.  Ultrasound is performed, showing an irregularly marginated hypoechoic mass which is vertically oriented and contains central calcifications. This is associated with mild shadowing.  This is located at the 11 o'clock position within the right breast 13 cm from nipple and measures 4 x 4 x 3 mm in size.  This is worrisome for a possible small invasive mammary carcinoma and tissue sampling is recommended.  I have discussed ultrasound-guided core biopsy of the mass with the patient. The patient would like to proceed with this at this time.  Ultrasound of the right axilla demonstrates no adenopathy.  IMPRESSION: 4 mm suspicious mass located within the right breast at the 11 o'clock position 13 cm from the nipple.  Tissue sampling is recommended and ultrasound-guided core biopsy will be performed and reported separately.  RECOMMENDATION: Right breast ultrasound guided core biopsy.  I have discussed the findings and recommendations with the patient. Results were also provided in writing at the conclusion of the visit.  BI-RADS CATEGORY 4:  Suspicious abnormality - biopsy should be considered.   Original Report Authenticated By: Rolla Plate, M.D.    Mm Digital Diagnostic Unilat R  03/02/2012  *RADIOLOGY REPORT*  Clinical Data:  Post right breast ultrasound guided core biopsy.  DIGITAL DIAGNOSTIC RIGHT BREAST MAMMOGRAM  Comparison:  Previous exams.  Findings:  Films are performed following ultrasound guided biopsy of the small mass located within the right breast at the 11 o'clock position.  The cylinder shaped clip is in appropriate position.  IMPRESSION:  Appropriate positioning of clip following right breast ultrasound guided core biopsy.   Original Report Authenticated By: Rolla Plate, M.D.    Mm Digital Screening  02/12/2012  *RADIOLOGY REPORT*  Clinical Data: Screening.  DIGITAL BILATERAL SCREENING MAMMOGRAM WITH CAD  Comparison:  Previous exams.  FINDINGS:  ACR Breast Density Category 2: There is a scattered fibroglandular pattern.  In the right breast, a possible mass warrants further evaluation with spot compression views and possibly ultrasound.  In the left breast, no masses or malignant type calcifications are identified.  Images were processed with CAD.  IMPRESSION: Further evaluation is suggested for possible mass in the right breast.  RECOMMENDATION: Diagnostic mammogram and possibly ultrasound of the right breast. (Code:[id number]-54M)  The patient will be contacted regarding the findings, and additional imaging will be  scheduled.  BI-RADS CATEGORY 0:  Incomplete.  Need additional imaging evaluation and/or prior mammograms for comparison.   Original Report Authenticated By: Norva Pavlov, M.D.    [place] Rt Breast Bx W Loc Dev 1st Lesion Img Bx Spec US Guide  03/03/2012  **ADDENDUM** CREATED: 03/03/2012 12:06:06  Findings: Pathology returned as invasive mammary cancer.  This is concordant with imaging findings.  The patient was telephoned on 03/03/2012 at approximately 1145 hours and I discussed these results with her.  She denies pain or tenderness at the biopsy site.  RECOMMENDATION: The patient is scheduled for the Multidisciplinary Breast Cancer Clinic at the [facility] on 03/11/2012.  **END ADDENDUM** SIGNED BY: Arnell Sieving, M.D.   03/02/2012  *RADIOLOGY REPORT*  Clinical Data:  Right breast mass  ULTRASOUND GUIDED CORE BIOPSY OF THE right BREAST  Comparison: Previous exams.  I met with the patient and we discussed the procedure of ultrasound- guided biopsy, including benefits and alternatives.  We discussed the high  likelihood of a successful procedure. We discussed the risks of the procedure, including infection, bleeding, tissue injury, clip migration, and inadequate sampling.  Informed written consent was given.  Using sterile technique 2% lidocaine, ultrasound guidance and a 14 gauge automated biopsy device, biopsy was performed of the small mass located within the right breast at the 11 o'clock position using a medial approach.  At the conclusion of the procedure a cylinder shaped tissue marker clip was deployed into the biopsy cavity.  Follow up 2 view mammogram was performed and dictated separately.  IMPRESSION: Ultrasound guided biopsy of the right breast mass located at the 11 o'clock position as discussed above.  No apparent complications.  Original Report Authenticated By: Rolla Plate, M.D.      LABS:    Chemistry      Component Value Date/Time   NA 142 03/11/2012 0850   NA 141 07/14/2011 0415   K 3.5 03/11/2012 0850   K 4.1 07/14/2011 0415   CL 102 03/11/2012 0850   CL 106 07/14/2011 0415   CO2 29 03/11/2012 0850   CO2 26 07/14/2011 0415   BUN 23.9 03/11/2012 0850   BUN 20 07/14/2011 0415   CREATININE 1.4* 03/11/2012 0850   CREATININE 0.87 07/14/2011 0415      Component Value Date/Time   CALCIUM 10.1 03/11/2012 0850   CALCIUM 8.8 07/14/2011 0415   ALKPHOS 22* 03/11/2012 0850   ALKPHOS 24* 07/04/2011 1100   AST 21 03/11/2012 0850   AST 21 07/04/2011 1100   ALT 20 03/11/2012 0850   ALT 16 07/04/2011 1100   BILITOT 0.37 03/11/2012 0850   BILITOT 0.3 07/04/2011 1100      Lab Results  Component Value Date   WBC 6.4 03/11/2012   HGB 12.6 03/11/2012   HCT 37.9 03/11/2012   MCV 87.9 03/11/2012   PLT 82* 03/11/2012   PATHOLOGY: ADDITIONAL INFORMATION: CHROMOGENIC IN-SITU HYBRIDIZATION Interpretation HER-2/NEU BY CISH - NO AMPLIFICATION OF HER-2 DETECTED. THE RATIO OF HER-2: CEP 17 SIGNALS WAS 1.25. Reference range: Ratio: HER2:CEP17 < 1.8 - gene amplification not observed Ratio: HER2:CEP 17  1.8-2.2 - equivocal result Ratio: HER2:CEP17 > 2.2 - gene amplification observed Jimmy Picket MD Pathologist, Electronic Signature ( Signed 03/06/2012) PROGNOSTIC INDICATORS - ACIS Results IMMUNOHISTOCHEMICAL AND MORPHOMETRIC ANALYSIS BY THE AUTOMATED CELLULAR IMAGING SYSTEM (ACIS) Estrogen Receptor (Negative, <1%): 100%, STRONG STAINING INTENSITY Progesterone Receptor (Negative, <1%): 100%, STRONG STAINING INTENSITY Proliferation Marker Ki67 by M IB-1 (Low<20%): 71% All controls stained appropriately [facility]  PATRICK MD Pathologist, Electronic Signature ( Signed 03/06/2012) Invasive tumor demonstrates strong diffuse E cadherin expression, supporting a ductal origin. 1 of 3 FINAL for Dumont, Fatemah S ([id number]) ADDITIONAL INFORMATION:(continued) (CRR:caf 03/04/12) [place] RUND DO Pathologist, Electronic Signature ( Signed 03/04/2012) FINAL DIAGNOSIS Diagnosis Breast, right, needle core biopsy, 11 o'clock - INVASIVE MAMMARY CARCINOMA, SEE COMMENT. - MICROCALCIFICATIONS IDENTIFIED. Microscopic Comment Although the grade of tumor is best assessed at resection, with these biopsies the invasive tumor is Grade I. E cadherin and breast prognostic profile is pending and will be reported in an addendum The case was reviewed with Dr. Raynald Blend who concurs. (CRR:caf 03/03/12) [place] RUND DO Pathologist, Electronic Signature (Case signed 03/03/2012) Specimen Gross and Clinical Information Specimen Comment Abnormal mammo In formalin 4:15, extracted <3 mins Specimen(s) Obtained: Breast, right, needle core biopsy, 11 o'clock   ASSESSMENT    79 year old female with  #1 newly diagnosed invasive ductal carcinoma measuring radiographically 4 mm in greatest diameter. This was in the right breast at the 11:00 position. Biopsies performed showed a grade 1 invasive ductal carcinoma ER +100% PR +100% HER-2/neu negative with Ki-67 at 71%. Patient is seen in the multidisciplinary breast clinic for discussion  of treatment options. Overall she's doing well. She was seen by Dr. Michell Heinrich as well as Dr. Harriette Bouillon. Her case was discussed at the multidisciplinary breast clinic for treatment options. Recommendations are based on NCCN and guidelines for early stage ER positive breast cancer.  #2 patient is recommended a lumpectomy with sentinel lymph node biopsy. Post surgery I will plan on seeing her back for discussion of antiestrogen therapy. I do think that she will benefit from Arimidex 1 mg daily we discussed risks and benefits of this.  #3She is also seen by Dr. Michell Heinrich for discussion of post lumpectomy radiation therapy.  Clinical Trial Eligibility:no Multidisciplinary conference discussionyes     PLAN:    #1 patient will proceed with lumpectomy with sentinel lymph node biopsy.  #2 I will see her back after her surgery.        Discussion: Patient is being treated per NCCN breast cancer care guidelines appropriate for stage.I   Thank you so much for allowing me to participate in the care of Marla S Tani. I will continue to follow up the patient with you and assist in her care.  All questions were answered. The patient knows to call the clinic with any problems, questions or concerns. We can certainly see the patient much sooner if necessary.  I spent 55 minutes counseling the patient face to face. The total time spent in the appointment was 55 minutes.  Drue Second, MD Medical/Oncology [facility] [phone] (beeper) [phone] (Office)  03/11/2012, 10:56 AM

## 2012-03-13 ENCOUNTER — Other Ambulatory Visit (INDEPENDENT_AMBULATORY_CARE_PROVIDER_SITE_OTHER): Payer: Self-pay | Admitting: Surgery

## 2012-03-13 DIAGNOSIS — C50911 Malignant neoplasm of unspecified site of right female breast: Secondary | ICD-10-CM

## 2012-03-16 ENCOUNTER — Encounter (HOSPITAL_COMMUNITY): Payer: Self-pay | Admitting: Pharmacy Technician

## 2012-03-17 ENCOUNTER — Encounter (HOSPITAL_COMMUNITY): Payer: Self-pay

## 2012-03-17 ENCOUNTER — Encounter (HOSPITAL_COMMUNITY)
Admission: RE | Admit: 2012-03-17 | Discharge: 2012-03-17 | Disposition: A | Discharge status: Home / Self Care | Payer: Medicare Other | Source: Ambulatory Visit | Attending: Surgery | Admitting: Surgery

## 2012-03-17 ENCOUNTER — Telehealth: Payer: Self-pay | Admitting: *Deleted

## 2012-03-17 ENCOUNTER — Telehealth: Payer: Self-pay | Admitting: Oncology

## 2012-03-17 VITALS — BP 152/77 | HR 74 | Temp 97.6°F | Resp 20 | Ht 62.0 in | Wt 177.9 lb

## 2012-03-17 DIAGNOSIS — C50911 Malignant neoplasm of unspecified site of right female breast: Secondary | ICD-10-CM

## 2012-03-17 LAB — CBC
HCT: 37.8 % (ref 36.0–46.0)
Hemoglobin: 12.8 g/dL (ref 12.0–15.0)
MCHC: 33.9 g/dL (ref 30.0–36.0)

## 2012-03-17 LAB — COMPREHENSIVE METABOLIC PANEL
Alkaline Phosphatase: 21 U/L — ABNORMAL LOW (ref 39–117)
BUN: 18 mg/dL (ref 6–23)
CO2: 31 mEq/L (ref 19–32)
GFR calc Af Amer: 60 mL/min — ABNORMAL LOW (ref >90)
GFR calc non Af Amer: 52 mL/min — ABNORMAL LOW (ref >90)
Glucose, Bld: 108 mg/dL — ABNORMAL HIGH (ref 70–99)
Potassium: 3.5 mEq/L (ref 3.5–5.1)
Total Bilirubin: 0.4 mg/dL (ref 0.3–1.2)
Total Protein: 7.5 g/dL (ref 6.0–8.3)

## 2012-03-17 LAB — SURGICAL PCR SCREEN: Staphylococcus aureus: INVALID — AB

## 2012-03-17 MED ORDER — DEXTROSE 5 % IV SOLN
3.0000 g | INTRAVENOUS | Status: DC
  Filled 2012-03-17: qty 3000

## 2012-03-17 NOTE — Progress Notes (Addendum)
Tuesday...the patient has hx of low platelets and [facility] in [facility])..They cannot explain why her platelets run low (she states)...Marland KitchenMarland KitchenShe is also scheduled to be at [facility] prior to coming here for surgery tomorrow.  I instructed to come over here after she has finished there......she understands and verbalizes...Marland KitchenDA I called the office regarding her platelet count...they will relay message to MD.  Also spoke with Dr. Jean Rosenthal and no new orders given...Marland KitchenMarland KitchenDA   1605  Noticed that PCR results were .."invalid"...I called lab and the sample had to be sent out.Marland Kitchenand will not be back until Thursday.........Marland KitchenDA   Results for orders placed during the hospital encounter of 03/17/12 (from the past 24 hour(s))  CBC     Status: Abnormal   Collection Time    03/17/12 10:41 AM      Result Value Range   WBC 8.0  4.0 - 10.5 K/uL   RBC 4.35  3.87 - 5.11 MIL/uL   Hemoglobin 12.8  12.0 - 15.0 g/dL   HCT 16.1  09.6 - 04.5 %   MCV 86.9  78.0 - 100.0 fL   MCH 29.4  26.0 - 34.0 pg   MCHC 33.9  30.0 - 36.0 g/dL   RDW 40.9  81.1 - 91.4 %   Platelets 83 (*) 150 - 400 K/uL  COMPREHENSIVE METABOLIC PANEL     Status: Abnormal   Collection Time    03/17/12 10:41 AM      Result Value Range   Sodium 143  135 - 145 mEq/L   Potassium 3.5  3.5 - 5.1 mEq/L   Chloride 101  96 - 112 mEq/L   CO2 31  19 - 32 mEq/L   Glucose, Bld 108 (*) 70 - 99 mg/dL   BUN 18  6 - 23 mg/dL   Creatinine, Ser 7.82  0.50 - 1.10 mg/dL   Calcium 95.6  8.4 - 21.3 mg/dL   Total Protein 7.5  6.0 - 8.3 g/dL   Albumin 4.2  3.5 - 5.2 g/dL   AST 26  0 - 37 U/L   ALT 19  0 - 35 U/L   Alkaline Phosphatase 21 (*) 39 - 117 U/L   Total Bilirubin 0.4  0.3 - 1.2 mg/dL   GFR calc non Af Amer 52 (*) >90 mL/min   GFR calc Af Amer 60 (*) >90 mL/min  SURGICAL PCR SCREEN     Status: Abnormal   Collection Time    03/17/12 10:44 AM      Result Value Range   MRSA, PCR INVALID RESULTS, SPECIMEN  SENT FOR CULTURE (*) NEGATIVE   Staphylococcus aureus INVALID RESULTS, SPECIMEN SENT FOR CULTURE (*) NEGATIVE   w

## 2012-03-17 NOTE — Telephone Encounter (Signed)
S/w the pt and she is aware of her march appt with dr Welton Flakes.

## 2012-03-17 NOTE — Pre-Procedure Instructions (Signed)
Diane Lucas  03/17/2012   Your procedure is scheduled on:  Wednesday, March 5th   Report to [facility] at  12:30 PM  Call this number if you have problems the morning of surgery: [phone]   Remember:   Do not eat food or drink liquids after midnight Tuesday.   Take these medicines the morning of surgery with A SIP OF WATER: Levothroxine, Omeprazole   Do not wear jewelry, make-up or nail polish.  Do not wear lotions, powders, or perfumes. You may NOT wear deodorant.             Ladies Do not shave underarms or legs 48 hours prior to surgery.    Do not bring valuables to the hospital.  Contacts, dentures or bridgework may not be worn into surgery.   Leave suitcase in the car. After surgery it may be brought to your room.  For patients admitted to the hospital, checkout time is 11:00 AM the day of discharge.   Patients discharged the day of surgery will not be allowed to drive home.   Name and phone number of your driver:    Special Instructions: Shower using CHG 2 nights before surgery and the night before surgery.  If you shower the day of surgery use CHG.  Use special wash - you have one bottle of CHG for all showers.  You should use approximately 1/3 of the bottle for each shower.   Please read over the following fact sheets that you were given: Pain Booklet, Coughing and Deep Breathing, MRSA Information and Surgical Site Infection Prevention

## 2012-03-17 NOTE — Telephone Encounter (Signed)
Spoke to pt concerning [facility] from 03/11/12.  Pt denies questions or concerns regarding dx or treatment care plan.  Confirmed surgery date and future appts.

## 2012-03-18 ENCOUNTER — Ambulatory Visit (HOSPITAL_COMMUNITY): Payer: Medicare Other | Admitting: Anesthesiology

## 2012-03-18 ENCOUNTER — Encounter (HOSPITAL_COMMUNITY)
Admission: RE | Admit: 2012-03-18 | Discharge: 2012-03-18 | Disposition: A | Discharge status: Home / Self Care | Payer: Medicare Other | Source: Ambulatory Visit | Attending: Surgery | Admitting: Surgery

## 2012-03-18 ENCOUNTER — Ambulatory Visit
Admission: RE | Admit: 2012-03-18 | Discharge: 2012-03-18 | Disposition: A | Discharge status: Home / Self Care | Payer: Medicare Other | Source: Ambulatory Visit | Attending: Surgery | Admitting: Surgery

## 2012-03-18 ENCOUNTER — Encounter (HOSPITAL_COMMUNITY): Payer: Self-pay | Admitting: Anesthesiology

## 2012-03-18 ENCOUNTER — Encounter (HOSPITAL_COMMUNITY)
Admission: RE | Disposition: A | Discharge status: Home / Self Care | Payer: Self-pay | Source: Ambulatory Visit | Attending: Surgery

## 2012-03-18 ENCOUNTER — Ambulatory Visit (HOSPITAL_COMMUNITY)
Admission: RE | Admit: 2012-03-18 | Discharge: 2012-03-18 | Disposition: A | Discharge status: Home / Self Care | Payer: Medicare Other | Source: Ambulatory Visit | Attending: Surgery | Admitting: Surgery

## 2012-03-18 DIAGNOSIS — Z7901 Long term (current) use of anticoagulants: Secondary | ICD-10-CM | POA: Insufficient documentation

## 2012-03-18 DIAGNOSIS — C50219 Malignant neoplasm of upper-inner quadrant of unspecified female breast: Secondary | ICD-10-CM | POA: Insufficient documentation

## 2012-03-18 DIAGNOSIS — Z79899 Other long term (current) drug therapy: Secondary | ICD-10-CM | POA: Insufficient documentation

## 2012-03-18 DIAGNOSIS — D0590 Unspecified type of carcinoma in situ of unspecified breast: Secondary | ICD-10-CM

## 2012-03-18 DIAGNOSIS — K219 Gastro-esophageal reflux disease without esophagitis: Secondary | ICD-10-CM | POA: Insufficient documentation

## 2012-03-18 DIAGNOSIS — I10 Essential (primary) hypertension: Secondary | ICD-10-CM | POA: Insufficient documentation

## 2012-03-18 DIAGNOSIS — D696 Thrombocytopenia, unspecified: Secondary | ICD-10-CM | POA: Insufficient documentation

## 2012-03-18 DIAGNOSIS — G4730 Sleep apnea, unspecified: Secondary | ICD-10-CM | POA: Insufficient documentation

## 2012-03-18 DIAGNOSIS — C50911 Malignant neoplasm of unspecified site of right female breast: Secondary | ICD-10-CM

## 2012-03-18 DIAGNOSIS — C50919 Malignant neoplasm of unspecified site of unspecified female breast: Secondary | ICD-10-CM | POA: Insufficient documentation

## 2012-03-18 DIAGNOSIS — Z888 Allergy status to other drugs, medicaments and biological substances status: Secondary | ICD-10-CM | POA: Insufficient documentation

## 2012-03-18 DIAGNOSIS — Z886 Allergy status to analgesic agent status: Secondary | ICD-10-CM | POA: Insufficient documentation

## 2012-03-18 DIAGNOSIS — Z170 Estrogen receptor positive status [ER+]: Secondary | ICD-10-CM | POA: Insufficient documentation

## 2012-03-18 DIAGNOSIS — M129 Arthropathy, unspecified: Secondary | ICD-10-CM | POA: Insufficient documentation

## 2012-03-18 DIAGNOSIS — E079 Disorder of thyroid, unspecified: Secondary | ICD-10-CM | POA: Insufficient documentation

## 2012-03-18 DIAGNOSIS — E7800 Pure hypercholesterolemia, unspecified: Secondary | ICD-10-CM | POA: Insufficient documentation

## 2012-03-18 DIAGNOSIS — Z883 Allergy status to other anti-infective agents status: Secondary | ICD-10-CM | POA: Insufficient documentation

## 2012-03-18 HISTORY — PX: BREAST LUMPECTOMY WITH NEEDLE LOCALIZATION AND AXILLARY SENTINEL LYMPH NODE BX: SHX5760

## 2012-03-18 LAB — MRSA CULTURE

## 2012-03-18 SURGERY — BREAST LUMPECTOMY WITH NEEDLE LOCALIZATION AND AXILLARY SENTINEL LYMPH NODE BX
Anesthesia: General | Site: Breast | Laterality: Right | Wound class: Clean

## 2012-03-18 MED ORDER — OXYCODONE HCL 5 MG/5ML PO SOLN: 5.0000 mg | Freq: Once | ORAL | Status: AC | PRN

## 2012-03-18 MED ORDER — CHLORHEXIDINE GLUCONATE 4 % EX LIQD: 1.0000 "application " | Freq: Once | CUTANEOUS | Status: DC

## 2012-03-18 MED ORDER — ONDANSETRON HCL 4 MG/2ML IJ SOLN
INTRAMUSCULAR | Status: AC
  Administered 2012-03-18: 4 mg
  Filled 2012-03-18: qty 2

## 2012-03-18 MED ORDER — TECHNETIUM TC 99M SULFUR COLLOID FILTERED
1.0000 | Freq: Once | INTRAVENOUS | Status: AC | PRN
  Administered 2012-03-18: 1 via INTRADERMAL

## 2012-03-18 MED ORDER — MUPIROCIN 2 % EX OINT
TOPICAL_OINTMENT | CUTANEOUS | Status: AC
  Filled 2012-03-18: qty 22

## 2012-03-18 MED ORDER — OXYCODONE HCL 5 MG PO TABS
5.0000 mg | ORAL_TABLET | Freq: Once | ORAL | Status: AC | PRN
  Administered 2012-03-18: 5 mg via ORAL

## 2012-03-18 MED ORDER — BUPIVACAINE-EPINEPHRINE 0.25% -1:200000 IJ SOLN
INTRAMUSCULAR | Status: DC | PRN
  Administered 2012-03-18: 30 mL

## 2012-03-18 MED ORDER — OXYCODONE-ACETAMINOPHEN 5-325 MG PO TABS: 1.0000 | ORAL_TABLET | ORAL | Status: DC | PRN

## 2012-03-18 MED ORDER — HEMOSTATIC AGENTS (NO CHARGE) OPTIME
TOPICAL | Status: DC | PRN
Start: 2012-03-18 — End: 2012-03-18
  Administered 2012-03-18: 1 via TOPICAL

## 2012-03-18 MED ORDER — HYDROMORPHONE HCL PF 1 MG/ML IJ SOLN
INTRAMUSCULAR | Status: AC
  Filled 2012-03-18: qty 1

## 2012-03-18 MED ORDER — BUPIVACAINE-EPINEPHRINE PF 0.25-1:200000 % IJ SOLN
INTRAMUSCULAR | Status: AC
  Filled 2012-03-18: qty 30

## 2012-03-18 MED ORDER — METHYLENE BLUE 1 % INJ SOLN
INTRAMUSCULAR | Status: DC | PRN
  Administered 2012-03-18: 15:00:00 via INTRAMUSCULAR

## 2012-03-18 MED ORDER — ONDANSETRON HCL 4 MG/2ML IJ SOLN
INTRAMUSCULAR | Status: DC | PRN
  Administered 2012-03-18: 4 mg via INTRAVENOUS

## 2012-03-18 MED ORDER — LIDOCAINE HCL (CARDIAC) 20 MG/ML IV SOLN
INTRAVENOUS | Status: DC | PRN
  Administered 2012-03-18: 50 mg via INTRAVENOUS

## 2012-03-18 MED ORDER — FENTANYL CITRATE 0.05 MG/ML IJ SOLN
INTRAMUSCULAR | Status: AC
  Administered 2012-03-18: 50 ug
  Filled 2012-03-18: qty 2

## 2012-03-18 MED ORDER — OXYCODONE-ACETAMINOPHEN 5-325 MG PO TABS
ORAL_TABLET | ORAL | Status: AC
  Administered 2012-03-18: 1
  Filled 2012-03-18: qty 1

## 2012-03-18 MED ORDER — DEXTROSE 5 % IV SOLN
3.0000 g | INTRAVENOUS | Status: DC | PRN
  Administered 2012-03-18: 3 g via INTRAVENOUS

## 2012-03-18 MED ORDER — MIDAZOLAM HCL 2 MG/2ML IJ SOLN
INTRAMUSCULAR | Status: AC
  Administered 2012-03-18: 1 mg
  Filled 2012-03-18: qty 2

## 2012-03-18 MED ORDER — ACETAMINOPHEN 10 MG/ML IV SOLN
1000.0000 mg | Freq: Once | INTRAVENOUS | Status: DC
  Filled 2012-03-18: qty 100

## 2012-03-18 MED ORDER — METHYLENE BLUE 1 % INJ SOLN
INTRAMUSCULAR | Status: AC
  Filled 2012-03-18: qty 10

## 2012-03-18 MED ORDER — LACTATED RINGERS IV SOLN
INTRAVENOUS | Status: DC
  Administered 2012-03-18: 14:00:00 via INTRAVENOUS

## 2012-03-18 MED ORDER — FENTANYL CITRATE 0.05 MG/ML IJ SOLN
INTRAMUSCULAR | Status: DC | PRN
  Administered 2012-03-18 (×2): 25 ug via INTRAVENOUS
  Administered 2012-03-18: 100 ug via INTRAVENOUS

## 2012-03-18 MED ORDER — OXYCODONE HCL 5 MG PO TABS
ORAL_TABLET | ORAL | Status: AC
  Filled 2012-03-18: qty 1

## 2012-03-18 MED ORDER — BUPIVACAINE-EPINEPHRINE PF 0.5-1:200000 % IJ SOLN
INTRAMUSCULAR | Status: AC
  Filled 2012-03-18: qty 30

## 2012-03-18 MED ORDER — PROPOFOL 10 MG/ML IV BOLUS
INTRAVENOUS | Status: DC | PRN
  Administered 2012-03-18: 130 mg via INTRAVENOUS

## 2012-03-18 MED ORDER — LACTATED RINGERS IV SOLN
INTRAVENOUS | Status: DC | PRN
  Administered 2012-03-18: 15:00:00 via INTRAVENOUS

## 2012-03-18 MED ORDER — HYDROMORPHONE HCL PF 1 MG/ML IJ SOLN
0.2500 mg | INTRAMUSCULAR | Status: DC | PRN
  Administered 2012-03-18 (×2): 0.5 mg via INTRAVENOUS

## 2012-03-18 MED ORDER — BUPIVACAINE-EPINEPHRINE (PF) 0.5% -1:200000 IJ SOLN
INTRAMUSCULAR | Status: AC
  Filled 2012-03-18: qty 10

## 2012-03-18 MED ORDER — ACETAMINOPHEN 10 MG/ML IV SOLN
INTRAVENOUS | Status: AC
  Administered 2012-03-18: 1000 mg via INTRAVENOUS
  Filled 2012-03-18: qty 100

## 2012-03-18 SURGICAL SUPPLY — 48 items
ADH SKN CLS APL DERMABOND .7 (GAUZE/BANDAGES/DRESSINGS) ×1
APPLIER CLIP 9.375 MED OPEN (MISCELLANEOUS) ×2
APR CLP MED 9.3 20 MLT OPN (MISCELLANEOUS) ×1
BINDER BREAST LRG (GAUZE/BANDAGES/DRESSINGS) IMPLANT
BINDER BREAST XLRG (GAUZE/BANDAGES/DRESSINGS) ×1 IMPLANT
BLADE SURG 10 STRL SS (BLADE) ×2 IMPLANT
BLADE SURG 15 STRL LF DISP TIS (BLADE) ×1 IMPLANT
BLADE SURG 15 STRL SS (BLADE) ×2
CANISTER SUCTION 2500CC (MISCELLANEOUS) ×2 IMPLANT
CHLORAPREP W/TINT 26ML (MISCELLANEOUS) ×2 IMPLANT
CLIP APPLIE 9.375 MED OPEN (MISCELLANEOUS) IMPLANT
CLOTH BEACON ORANGE TIMEOUT ST (SAFETY) ×2 IMPLANT
CONT SPEC 4OZ CLIKSEAL STRL BL (MISCELLANEOUS) ×2 IMPLANT
COVER PROBE W GEL 5X96 (DRAPES) ×2 IMPLANT
COVER SURGICAL LIGHT HANDLE (MISCELLANEOUS) ×2 IMPLANT
DERMABOND ADVANCED (GAUZE/BANDAGES/DRESSINGS) ×1
DERMABOND ADVANCED .7 DNX12 (GAUZE/BANDAGES/DRESSINGS) ×1 IMPLANT
DEVICE DUBIN SPECIMEN MAMMOGRA (MISCELLANEOUS) ×2 IMPLANT
DRAPE CHEST BREAST 15X10 FENES (DRAPES) ×2 IMPLANT
DRSG PAD ABDOMINAL 8X10 ST (GAUZE/BANDAGES/DRESSINGS) ×2 IMPLANT
ELECT CAUTERY BLADE 6.4 (BLADE) ×2 IMPLANT
ELECT REM PT RETURN 9FT ADLT (ELECTROSURGICAL) ×2
ELECTRODE REM PT RTRN 9FT ADLT (ELECTROSURGICAL) ×1 IMPLANT
GLOVE BIO SURGEON STRL SZ7.5 (GLOVE) ×3 IMPLANT
GOWN STRL NON-REIN LRG LVL3 (GOWN DISPOSABLE) ×4 IMPLANT
HEMOSTAT SNOW SURGICEL 2X4 (HEMOSTASIS) ×1 IMPLANT
KIT BASIN OR (CUSTOM PROCEDURE TRAY) ×2 IMPLANT
KIT MARKER MARGIN INK (KITS) ×1 IMPLANT
KIT ROOM TURNOVER OR (KITS) ×2 IMPLANT
NDL 18GX1X1/2 (RX/OR ONLY) (NEEDLE) ×1 IMPLANT
NDL HYPO 25GX1X1/2 BEV (NEEDLE) ×2 IMPLANT
NEEDLE 18GX1X1/2 (RX/OR ONLY) (NEEDLE) ×2 IMPLANT
NEEDLE HYPO 25GX1X1/2 BEV (NEEDLE) ×2 IMPLANT
NS IRRIG 1000ML POUR BTL (IV SOLUTION) ×2 IMPLANT
PACK SURGICAL SETUP 50X90 (CUSTOM PROCEDURE TRAY) ×2 IMPLANT
PAD ARMBOARD 7.5X6 YLW CONV (MISCELLANEOUS) ×2 IMPLANT
PENCIL BUTTON HOLSTER BLD 10FT (ELECTRODE) ×2 IMPLANT
SPONGE LAP 18X18 X RAY DECT (DISPOSABLE) ×2 IMPLANT
SUT MNCRL AB 4-0 PS2 18 (SUTURE) ×3 IMPLANT
SUT SILK 2 0 SH (SUTURE) ×1 IMPLANT
SUT VIC AB 3-0 SH 27 (SUTURE) ×4
SUT VIC AB 3-0 SH 27XBRD (SUTURE) ×1 IMPLANT
SYR BULB 3OZ (MISCELLANEOUS) ×2 IMPLANT
SYR CONTROL 10ML LL (SYRINGE) ×4 IMPLANT
TOWEL OR 17X24 6PK STRL BLUE (TOWEL DISPOSABLE) ×2 IMPLANT
TOWEL OR 17X26 10 PK STRL BLUE (TOWEL DISPOSABLE) ×2 IMPLANT
TUBE CONNECTING 12X1/4 (SUCTIONS) ×2 IMPLANT
YANKAUER SUCT BULB TIP NO VENT (SUCTIONS) ×2 IMPLANT

## 2012-03-18 NOTE — Transfer of Care (Signed)
Immediate Anesthesia Transfer of Care Note  Patient: Diane Lucas  Procedure(s) Performed: Procedure(s): BREAST LUMPECTOMY WITH NEEDLE LOCALIZATION AND AXILLARY SENTINEL LYMPH NODE BX (Right)  Patient Location: PACU  Anesthesia Type:General  Level of Consciousness: awake and alert   Airway & Oxygen Therapy: Patient Spontanous Breathing and Patient connected to nasal cannula oxygen  Post-op Assessment: Report given to PACU RN and Post -op Vital signs reviewed and stable  Post vital signs: Reviewed and stable  Complications: No apparent anesthesia complications

## 2012-03-18 NOTE — Preoperative (Signed)
Beta Blockers   Reason not to administer Beta Blockers:Not Applicable 

## 2012-03-18 NOTE — Anesthesia Procedure Notes (Signed)
Procedure Name: LMA Insertion Date/Time: 03/18/2012 3:05 PM Performed by: Gwenyth Allegra Pre-anesthesia Checklist: Emergency Drugs available, Patient identified, Timeout performed and Suction available Patient Re-evaluated:Patient Re-evaluated prior to inductionOxygen Delivery Method: Circle system utilized Preoxygenation: Pre-oxygenation with 100% oxygen Intubation Type: IV induction LMA: LMA inserted Number of attempts: 1 Placement Confirmation: positive ETCO2 and breath sounds checked- equal and bilateral Tube secured with: Tape Dental Injury: Teeth and Oropharynx as per pre-operative assessment

## 2012-03-18 NOTE — Interval H&P Note (Signed)
History and Physical Interval Note:  03/18/2012 1:54 PM  Diane Lucas  has presented today for surgery, with the diagnosis of right breast cancer  The various methods of treatment have been discussed with the patient and family. After consideration of risks, benefits and other options for treatment, the patient has consented to  Procedure(s): BREAST LUMPECTOMY WITH NEEDLE LOCALIZATION AND AXILLARY SENTINEL LYMPH NODE BX (Right) as a surgical intervention .  The patient's history has been reviewed, patient examined, no change in status, stable for surgery.  I have reviewed the patient's chart and labs.  Questions were answered to the patient's satisfaction.     CORNETT,THOMAS A.

## 2012-03-18 NOTE — Op Note (Signed)
Right Breast Lumpectomy with  Right Sentinal Node Biopsy Procedure Note  Indications: This patient presents with history of right breast cancer with clinically negative axillary lymph node exam.The procedure has been discussed with the patient. Alternatives to surgery have been discussed with the patient.  Risks of surgery include bleeding,  Infection,  Seroma formation, death,  and the need for further surgery.   The patient understands and wishes to proceed.Sentinel lymph node mapping and dissection has been discussed with the patient.  Risk of bleeding,  Infection,  Seroma formation,  Additional procedures,,  Shoulder weakness ,  Shoulder stiffness,  Nerve and blood vessel injury and reaction to the mapping dyes have been discussed.  Alternatives to surgery have been discussed with the patient.  The patient agrees to proceed.  Pre-operative Diagnosis: right breast cancer  Post-operative Diagnosis: right breast cancer  Surgeon: Harriette Bouillon A.   Assistants: OR   Anesthesia: General LMA anesthesia and Local anesthesia 0.25.% bupivacaine, with epinephrine  ASA Class: 2  Procedure Details  The patient was seen in the Holding Room. The risks, benefits, complications, treatment options, and expected outcomes were discussed with the patient. The possibilities of reaction to medication, pulmonary aspiration, bleeding, infection, the need for additional procedures, failure to diagnose a condition, and creating a complication requiring transfusion or operation were discussed with the patient. The patient concurred with the proposed plan, giving informed consent.  The site of surgery properly noted/marked. The patient was taken to Operating Room # 10, identified as Diane Lucas and the procedure verified as right  Breast Lumpectomy and Sentinal Node Biopsy. A Time Out was held and the above information confirmed. Under sterile  4cc methylene blue dye admixed with NS inject subareolar region and massaged.    After induction of anesthesia, the right arm, breast, and chest were prepped and draped in standard fashion.  Using a hand-held gamma probe, axillary sentinel nodes were identified transcutaneously.  An oblique incision was created below the axillary hairline.  Dissection was carried through the clavipectoral fascia.  4 level 1 axillary sentinel nodes were removed and submitted to pathology . Hemostasis achieved and Surgecel placed.  The axillary incision was closed with a 3-0 Vicryl  And 4 O monocryl subcuticular closure in layers.    The lumpectomy was performed by creating an oblique incision over the upper inner quadrant of the breastaround the previously placed localization guidewire.  Dissection was carried down to the pectoral fascia.  Orientation sutures were placed and the skin and pectoralis margins were inked.  Specimen radiography confirmed inclusion of the mammographic lesion. Gross margins clear.    Hemostasis was achieved with cautery.  The wound was irrigated and closed with a 4-0 Vicryl subcuticular closure in layersThe right arm, breast, and chest were prepped and draped in standard fashion.    Sterile dressings were applied. At the end of the operation, all sponge, instrument, and needle counts were correct.  Findings: grossly clear surgical margins  Estimated Blood Loss:  Minimal         Drains: none          Total IV Fluids: 1200 ml         Specimens: see above                 Complications:  None; patient tolerated the procedure well.         Disposition: PACU - hemodynamically stable.         Condition: stable  Attending Attestation: I  performed the procedure.

## 2012-03-18 NOTE — Anesthesia Postprocedure Evaluation (Signed)
  Anesthesia Post-op Note  Patient: Diane Lucas  Procedure(s) Performed: Procedure(s): BREAST LUMPECTOMY WITH NEEDLE LOCALIZATION AND AXILLARY SENTINEL LYMPH NODE BX (Right)  Patient Location: PACU  Anesthesia Type:General  Level of Consciousness: awake  Airway and Oxygen Therapy: Patient Spontanous Breathing and Patient connected to nasal cannula oxygen  Post-op Pain: mild  Post-op Assessment: Post-op Vital signs reviewed, Patient's Cardiovascular Status Stable, Respiratory Function Stable, Patent Airway and No signs of Nausea or vomiting  Post-op Vital Signs: Reviewed and stable  Complications: No apparent anesthesia complications

## 2012-03-18 NOTE — Anesthesia Preprocedure Evaluation (Signed)
Anesthesia Evaluation  Patient identified by MRN, date of birth, ID band Patient awake    Reviewed: Allergy & Precautions, H&P , NPO status , Patient's Chart, lab work & pertinent test results  Airway Mallampati: II TM Distance: >3 FB Neck ROM: Full    Dental no notable dental hx. (+) Teeth Intact and Dental Advisory Given   Pulmonary neg pulmonary ROS, neg sleep apnea,  breath sounds clear to auscultation  Pulmonary exam normal       Cardiovascular hypertension, On Medications Rhythm:Regular Rate:Normal     Neuro/Psych negative neurological ROS  negative psych ROS   GI/Hepatic Neg liver ROS, GERD-  Medicated and Controlled,  Endo/Other  negative endocrine ROS  Renal/GU negative Renal ROS  negative genitourinary   Musculoskeletal   Abdominal   Peds  Hematology negative hematology ROS (+)   Anesthesia Other Findings   Reproductive/Obstetrics negative OB ROS                           Anesthesia Physical Anesthesia Plan  ASA: II  Anesthesia Plan: General   Post-op Pain Management:    Induction: Intravenous  Airway Management Planned: LMA  Additional Equipment:   Intra-op Plan:   Post-operative Plan: Extubation in OR  Informed Consent: I have reviewed the patients History and Physical, chart, labs and discussed the procedure including the risks, benefits and alternatives for the proposed anesthesia with the patient or authorized representative who has indicated his/her understanding and acceptance.   Dental advisory given  Plan Discussed with: CRNA  Anesthesia Plan Comments:         Anesthesia Quick Evaluation

## 2012-03-18 NOTE — H&P (View-Only) (Signed)
Patient ID: Diane Lucas, female   DOB: 12-25-1930, 79 y.o.   MRN: [id number]  No chief complaint on file.   HPI Diane Lucas is a 79 y.o. female.  Pt sent at the request of Dr Jean Rosenthal for right breast mass.  Pt denies symptoms of breast mass,  Pain or discharge.  Mammography and U/S shows 4 mm x 4mm x 3 mm mass core biopsied to be IDC   ER/PR positive her 2 neu negative.   HPI  Past Medical History  Diagnosis Date  . Hypertension   . Elevated cholesterol   . Arthritis   . GERD (gastroesophageal reflux disease)   . Thyroid condition   . Vertigo   . Sleep apnea     STOP BANG SCORE 4  . Platelets decreased   . Breast cancer   . Back pain     Past Surgical History  Procedure Laterality Date  . Appendectomy    . Vein ligation and stripping      X2  . Hernia repair      ING HERNIA REPAIR  . Hip fracture surgery    . Joint replacement      RT HIP  . Dilation and curettage of uterus    . Abdominal hysterectomy    . Back surgery      Family History  Problem Relation Age of Onset  . Prostate cancer Brother   . Bladder Cancer Maternal Grandfather     Social History History  Substance Use Topics  . Smoking status: Never Smoker   . Smokeless tobacco: Not on file  . Alcohol Use: No    Allergies  Allergen Reactions  . Amlodipine Swelling  . Aspirin     Reaction=causes bleeding  . Benazepril     Tongue swelling  . Minocycline     swelling    Current Outpatient Prescriptions  Medication Sig Dispense Refill  . alendronate (FOSAMAX) 70 MG tablet Take 70 mg by mouth every 7 (seven) days. Take with a full glass of water on an empty stomach.      [name] BIOTIN 5000 PO Take by mouth daily. 10,000      . diphenhydramine-acetaminophen (TYLENOL PM) 25-500 MG TABS Take 1 tablet by mouth at bedtime as needed. For sleep      . hydrochlorothiazide (HYDRODIURIL) 25 MG tablet Take 25 mg by mouth daily before breakfast.      . levothyroxine (SYNTHROID, LEVOTHROID) 50 MCG tablet Take 50  mcg by mouth daily before breakfast.      . Multiple Vitamins-Minerals (CENTRUM SILVER PO) Take by mouth daily.      [name] omeprazole (PRILOSEC) 20 MG capsule Take 20 mg by mouth daily with breakfast.      . rivaroxaban (XARELTO) 10 MG TABS tablet Take 1 tablet (10 mg total) by mouth daily with breakfast. Take Xarelto for two and a half more weeks, then discontinue Xarelto.  18 tablet  0  . simvastatin (ZOCOR) 40 MG tablet Take 40 mg by mouth at bedtime.       No current facility-administered medications for this visit.    Review of Systems Review of Systems  Constitutional: Negative.   HENT: Negative.   Eyes: Negative.   Respiratory: Negative.   Cardiovascular: Negative.   Gastrointestinal: Negative.   Endocrine: Negative.   Genitourinary: Negative.   Allergic/Immunologic: Negative.   Neurological: Negative.   Hematological: Negative.   Psychiatric/Behavioral: Negative.     There were no vitals taken for this  visit.  Physical Exam Physical Exam  Constitutional: She is oriented to person, place, and time. She appears well-developed and well-nourished.  HENT:  Head: Normocephalic and atraumatic.  Eyes: EOM are normal. Pupils are equal, round, and reactive to light.  Neck: Normal range of motion. Neck supple.  Cardiovascular: Normal rate and regular rhythm.   Pulmonary/Chest: Effort normal and breath sounds normal. Right breast exhibits no inverted nipple, no mass, no nipple discharge, no skin change and no tenderness. Left breast exhibits no inverted nipple, no mass, no nipple discharge, no skin change and no tenderness. Breasts are symmetrical.  Musculoskeletal: Normal range of motion.  Lymphadenopathy:    She has no axillary adenopathy.  Neurological: She is alert and oriented to person, place, and time.  Skin: Skin is warm and dry.  Psychiatric: She has a normal mood and affect. Her behavior is normal. Judgment and thought content normal.    Data Reviewed RADIOLOGY REPORT*   Clinical Data: Recall from screening mammogram.  DIGITAL DIAGNOSTIC RIGHT BREAST MAMMOGRAM AND RIGHT BREAST  ULTRASOUND:  Comparison: 02/11/2012, 05/01/2009, 03/21/2008, 03/20/2007.  Findings:  ACR Breast Density Category 2: There is a scattered fibroglandular  pattern.  Additional views of the right breast demonstrate a small  irregularly marginated mass located within the upper inner quadrant  of the right breast at the 11 o'clock position with associated  pleomorphic calcifications. By mammography the mass measures 6 mm  in size.  On physical exam, there is no discrete palpable abnormality within  the upper inner quadrant of the right breast.  Ultrasound is performed, showing an irregularly marginated  hypoechoic mass which is vertically oriented and contains central  calcifications. This is associated with mild shadowing. This is  located at the 11 o'clock position within the right breast 13 cm  from nipple and measures 4 x 4 x 3 mm in size. This is worrisome  for a possible small invasive mammary carcinoma and tissue sampling  is recommended. I have discussed ultrasound-guided core biopsy of  the mass with the patient. The patient would like to proceed with  this at this time.  Ultrasound of the right axilla demonstrates no adenopathy.  IMPRESSION:  4 mm suspicious mass located within the right breast at the 11  o'clock position 13 cm from the nipple. Tissue sampling is  recommended and ultrasound-guided core biopsy will be performed and  reported separately.  RECOMMENDATION:  Right breast ultrasound guided core biopsy.   Assessment    Stage 1 right breast cancer    Plan    Pt wishes breast conservation.Discussed right breast lumpectomy with needle localization and SLN mapping. The procedure has been discussed with the patient. Alternatives to surgery have been discussed with the patient.  Risks of surgery include bleeding,  Infection,  Seroma formation, death,  and the  need for further surgery.   The patient understands and wishes to proceed.Sentinel lymph node mapping and dissection has been discussed with the patient.  Risk of bleeding,  Infection,  Seroma formation,  Additional procedures,,  Shoulder weakness ,  Shoulder stiffness,  Nerve and blood vessel injury and reaction to the mapping dyes have been discussed.  Alternatives to surgery have been discussed with the patient.  The patient agrees to proceed.       Awais Cobarrubias A. 03/11/2012, 10:24 AM

## 2012-03-19 ENCOUNTER — Encounter (HOSPITAL_COMMUNITY): Payer: Self-pay | Admitting: Surgery

## 2012-03-24 ENCOUNTER — Telehealth (INDEPENDENT_AMBULATORY_CARE_PROVIDER_SITE_OTHER): Payer: Self-pay

## 2012-03-24 NOTE — Telephone Encounter (Signed)
Message copied by Brennan Bailey on Tue Mar 24, 2012 10:01 AM ------      Message from: Harriette Bouillon A      Created: Tue Mar 24, 2012  7:09 AM       Margins clear node negative.  Looks great ------

## 2012-03-24 NOTE — Telephone Encounter (Signed)
I called patient and gave her path results. She will follow up nwxt week.

## 2012-03-27 ENCOUNTER — Ambulatory Visit (INDEPENDENT_AMBULATORY_CARE_PROVIDER_SITE_OTHER): Payer: Medicare Other | Admitting: General Surgery

## 2012-03-27 ENCOUNTER — Encounter (INDEPENDENT_AMBULATORY_CARE_PROVIDER_SITE_OTHER): Payer: Self-pay | Admitting: General Surgery

## 2012-03-27 VITALS — BP 152/76 | HR 99 | Temp 97.4°F | Ht 62.0 in | Wt 176.6 lb

## 2012-03-27 DIAGNOSIS — C50411 Malignant neoplasm of upper-outer quadrant of right female breast: Secondary | ICD-10-CM

## 2012-03-27 DIAGNOSIS — C50419 Malignant neoplasm of upper-outer quadrant of unspecified female breast: Secondary | ICD-10-CM

## 2012-03-27 NOTE — Patient Instructions (Signed)
You have a superficial infection in your right axillary wound. We opened this up and drain this today.  Change the bandage 2 or 3 times a day.  Do not put any ointment or cream on the wound. We want to let it drain freely.  You may take a shower  Take the antibiotics until they are gone. You have been given a 7 day supply.  Keep her appointment with Dr. Luisa Hart next week.  If you are not clearly feeling better and the wound is not clearly looking better on Monday, give [place] a call.

## 2012-03-27 NOTE — Progress Notes (Signed)
Patient ID: Diane Lucas, female   DOB: 1930/04/22, 79 y.o.   MRN: [id number] History: This patient underwent right partial mastectomy and sentinel node biopsy by Dr. Luisa Hart on 03/18/2012. She presents with a 48-hour history of increasing discomfort and chills. She did have some drainage from the wound yesterday. Feels a little bit better today. She is here with a friend  Exam pleasant older woman in no distress. Temp 97.4 Right lumpectomy incision looks okay, but right axillary and incision looks infected. There is  Dermabond on the wound. There is erythema and induration. Following alcohol prep I used a hemostat to spread the wound open stump and got some cloudy watery fluid out. I probed this until I I was comfortable that I had found the cavity. Dry gauze bandage  Assessment: Right axillary wound infection, opened and drained today Cancer right breast, 9 days postop right partial mastectomy and sentinel node biopsy  Plan: Wound care instructions given. Change bandage 2 or 3 times a day. Okay to shower. Doxycycline 100 mg twice a day for 7 day course. Keep appointment with Dr. Luisa Hart in 7 days, but if she is not clearly feeling better and looking better on Monday to give [place] a call for earlier check.   Angelia Mould. Derrell Lolling, M.D., [facility] General and Minimally invasive Surgery Breast and Colorectal Surgery Office:   [phone] Pager:   [phone]

## 2012-04-03 ENCOUNTER — Encounter (INDEPENDENT_AMBULATORY_CARE_PROVIDER_SITE_OTHER): Payer: Self-pay | Admitting: Surgery

## 2012-04-03 ENCOUNTER — Ambulatory Visit (INDEPENDENT_AMBULATORY_CARE_PROVIDER_SITE_OTHER): Payer: Medicare Other | Admitting: Surgery

## 2012-04-03 VITALS — BP 122/74 | HR 64 | Temp 97.8°F | Resp 14 | Ht 62.0 in | Wt 177.2 lb

## 2012-04-03 DIAGNOSIS — Z9889 Other specified postprocedural states: Secondary | ICD-10-CM

## 2012-04-03 NOTE — Patient Instructions (Addendum)
Return 4 weeks

## 2012-04-03 NOTE — Progress Notes (Signed)
Patient returns after right breast lumpectomy and right axillary sentinel lymph node mapping T1 N0 MX stage I right breast cancer ER positive PR positive HER-2/neu negative. She developed a seroma that was drained by Dr. Derrell Lolling. She's finished her antibiotics. Is better.  Exam: Right breast incisions clean dry and intact. No residual seroma or signs of infection.  Impression: Stage I right breast cancer status post breast conservation  Plan: To see medical oncology next week. Return 4 weeks.

## 2012-04-07 ENCOUNTER — Ambulatory Visit (HOSPITAL_BASED_OUTPATIENT_CLINIC_OR_DEPARTMENT_OTHER): Payer: Medicare Other | Admitting: Oncology

## 2012-04-07 ENCOUNTER — Telehealth: Payer: Self-pay | Admitting: *Deleted

## 2012-04-07 VITALS — BP 140/60 | HR 71 | Temp 97.9°F | Resp 20 | Ht 62.0 in | Wt 180.2 lb

## 2012-04-07 DIAGNOSIS — C50911 Malignant neoplasm of unspecified site of right female breast: Secondary | ICD-10-CM

## 2012-04-07 DIAGNOSIS — C50419 Malignant neoplasm of upper-outer quadrant of unspecified female breast: Secondary | ICD-10-CM

## 2012-04-07 DIAGNOSIS — M899 Disorder of bone, unspecified: Secondary | ICD-10-CM

## 2012-04-07 DIAGNOSIS — Z170 Estrogen receptor positive status [ER+]: Secondary | ICD-10-CM

## 2012-04-07 MED ORDER — EXEMESTANE 25 MG PO TABS: 25.0000 mg | ORAL_TABLET | Freq: Every day | ORAL | Status: DC

## 2012-04-07 NOTE — Patient Instructions (Addendum)
Proceed with aromasin  25 mg daily  We will bone density scan  I will see you back in 3 months  Take calcium and vitamin (clatrate 2 a day)  Take vitamin D3 1000iu   Exemestane tablets What is this medicine? EXEMESTANE (ex e MES tane) blocks the production of the hormone estrogen. Some types of breast cancer depend on estrogen to grow, and this medicine can stop tumor growth by blocking estrogen production. This medicine is for the treatment of breast cancer in postmenopausal women only. This medicine may be used for other purposes; ask your health care provider or pharmacist if you have questions. What should I tell my health care provider before I take this medicine? They need to know if you have any of these conditions: -an unusual or allergic reaction to exemestane, other medicines, foods, dyes, or preservatives -pregnant or trying to get pregnant -breast-feeding How should I use this medicine? Take this medicine by mouth with a glass of water. Follow the directions on the prescription label. Take your doses at regular intervals after a meal. Do not take your medicine more often than directed. Do not stop taking except on the advice of your doctor or health care professional. Contact your pediatrician regarding the use of this medicine in children. Special care may be needed. Overdosage: If you think you have taken too much of this medicine contact a poison control center or emergency room at once. NOTE: This medicine is only for you. Do not share this medicine with others. What if I miss a dose? If you miss a dose, take the next dose as usual. Do not try to make up the missed dose. Do not take double or extra doses. What may interact with this medicine? Do not take this medicine with any of the following medications: -female hormones, like estrogens and birth control pills This medicine may also interact with the following medications: -androstenedione -phenytoin -rifabutin,  rifampin, or rifapentine -St. John's Wort This list may not describe all possible interactions. Give your health care provider a list of all the medicines, herbs, non-prescription drugs, or dietary supplements you use. Also tell them if you smoke, drink alcohol, or use illegal drugs. Some items may interact with your medicine. What should I watch for while using this medicine? Visit your doctor or health care professional for regular checks on your progress. If you experience hot flashes or sweating while taking this medicine, avoid alcohol, smoking and drinks with caffeine. This may help to decrease these side effects. What side effects may I notice from receiving this medicine? Side effects that you should report to your doctor or health care professional as soon as possible: -any new or unusual symptoms -changes in vision -fever -leg or arm swelling -pain in bones, joints, or muscles -pain in hips, back, ribs, arms, shoulders, or legs Side effects that usually do not require medical attention (report to your doctor or health care professional if they continue or are bothersome): -difficulty sleeping -headache -hot flashes -sweating -unusually weak or tired This list may not describe all possible side effects. Call your doctor for medical advice about side effects. You may report side effects to FDA at 1-800-FDA-1088. Where should I keep my medicine? Keep out of the reach of children. Store at room temperature between 15 and 30 degrees C (59 and 86 degrees F). Throw away any unused medicine after the expiration date. NOTE: This sheet is a summary. It may not cover all possible information. If you have  questions about this medicine, talk to your doctor, pharmacist, or health care provider.  2012, [employer]. (05/05/2007 11:48:29 AM)

## 2012-04-07 NOTE — Telephone Encounter (Signed)
appts made and printed 

## 2012-04-07 NOTE — Progress Notes (Signed)
OFFICE PROGRESS NOTE  CC  Diane Grays, MD [address] [place] [address] Dr. Lurline Hare  Dr. Harriette Bouillon   DIAGNOSIS: 79 year old female with new diagnosis of stage I right invasive ductal carcinoma.   STAGE:  Cancer of upper-outer quadrant of female breast  Primary site: Breast (Right)  Staging method: AJCC 7th Edition  Clinical: Stage IA (T1a, N0, cM0)  Summary: Stage IA (T1a, N0, cM0)   PRIOR THERAPY: #1 79 y.o. female. With medical history significant for our do some hypertension and ostial process. Patient recently had a screening mammogram performed that revealed a 4 x 4 by 3 mm area of concern at the 11:00 position in the right breast. Patient went on to have a biopsy performed on 03/02/2002. The pathology revealed invasive mammary carcinoma with microcalcifications grade 1. Tumor was ER +100% PR +100% HER-2/neu negative with a proliferation marker Ki-67 71%. E-cadherin stain revealed the patient in fact had a ductal carcinoma phenotype. She was seen in the [facility] clinic for discussion of treatment options  #2 patient is status post right lumpectomy with sentinel lymph node biopsy performed on 03/18/2012. The final pathology revealed a 0.9 cm invasive cancer that was grade 1 with associated DCIS sentinel node was negative for metastatic disease. Tumor was ER positive PR positive HER-2/neu negative. Postoperatively she is doing well.  #3 we discussed adjuvant treatment options. I think she is a good candidate for antiestrogen therapy with Aromasin 25 mg daily. She is  on Fosamax 70 mg daily for bone health. She certainly will need to continue having mammograms as well as bone density scans. We discussed this. We discussed the risks and benefits of Aromasin therapy. She understands the rationale and is agreeable to starting this.  CURRENT THERAPY: Aromasin 25 mg daily starting 04/07/2012.  INTERVAL HISTORY: Regena Delucchi Lampert 79 y.o. female  returns for followup visit after having her lumpectomy performed. She is doing well without any problems. She denies any nausea vomiting fevers chills night sweats headaches no shortness of breath chest pains or palpitations. She has no fevers no pain or tenderness. Remainder of the 10 point review of systems is negative.  MEDICAL HISTORY: Past Medical History  Diagnosis Date  . Hypertension   . Elevated cholesterol   . Arthritis   . GERD (gastroesophageal reflux disease)   . Thyroid condition   . Vertigo   . Platelets decreased   . Back pain   . Sleep apnea     STOP BANG SCORE 4  . Apnea, sleep     never tested for OSA  . Breast cancer     ALLERGIES:  is allergic to amlodipine; aspirin; benazepril; minocycline; and xarelto.  MEDICATIONS:  Current Outpatient Prescriptions  Medication Sig Dispense Refill  . alendronate (FOSAMAX) 70 MG tablet Take 70 mg by mouth every 7 (seven) days. Takes on Tuesdays.  Take with a full glass of water on an empty stomach.      [name] BIOTIN PO Take 1 tablet by mouth daily.       . hydrochlorothiazide (HYDRODIURIL) 25 MG tablet Take 25 mg by mouth daily before breakfast.      . levothyroxine (SYNTHROID, LEVOTHROID) 50 MCG tablet Take 50 mcg by mouth daily before breakfast.      . Multiple Vitamins-Minerals (CENTRUM SILVER PO) Take by mouth daily.      [name] omeprazole (PRILOSEC) 20 MG capsule Take 20 mg by mouth daily with breakfast.      .  simvastatin (ZOCOR) 40 MG tablet Take 40 mg by mouth at bedtime.      . diphenhydramine-acetaminophen (TYLENOL PM) 25-500 MG TABS Take 1 tablet by mouth at bedtime.      [name] oxyCODONE-acetaminophen (PERCOCET/ROXICET) 5-325 MG per tablet        No current facility-administered medications for this visit.    SURGICAL HISTORY:  Past Surgical History  Procedure Laterality Date  . Appendectomy    . Vein ligation and stripping      X2  . Hernia repair      ING HERNIA REPAIR  . Hip fracture surgery    . Joint replacement       RT HIP  . Dilation and curettage of uterus    . Abdominal hysterectomy    . Back surgery    . Some hardware removed from right hip Right     some hardware removed   . Breast lumpectomy with needle localization and axillary sentinel lymph node bx Right 03/18/2012    Procedure: BREAST LUMPECTOMY WITH NEEDLE LOCALIZATION AND AXILLARY SENTINEL LYMPH NODE BX;  Surgeon: Clovis Pu. Cornett, MD;  Location: [facility] OR;  Service: General;  Laterality: Right;  . Breast surgery      REVIEW OF SYSTEMS:  Pertinent items are noted in HPI.   HEALTH MAINTENANCE:   PHYSICAL EXAMINATION: Blood pressure 140/60, pulse 71, temperature 97.9 F (36.6 C), temperature source Oral, resp. rate 20, height 5\' 2"  (1.575 m), weight 180 lb 3.2 oz (81.738 kg). Body mass index is 32.95 kg/(m^2). ECOG PERFORMANCE STATUS: 1 - Symptomatic but completely ambulatory  Patient is awake alert in no acute distress well appearing female HEENT exam: EOMI PERRLA sclerae anicteric no conjunctival pallor oral mucosa is moist neck is supple lungs clear cardiovascular regular rate rhythm abdomen is soft nontender no HSM extremities no edema neuro nonfocal right breast healing surgical scar no masses or nodularity.   LABORATORY DATA: Lab Results  Component Value Date   WBC 8.0 03/17/2012   HGB 12.8 03/17/2012   HCT 37.8 03/17/2012   MCV 86.9 03/17/2012   PLT 83* 03/17/2012      Chemistry      Component Value Date/Time   NA 143 03/17/2012 1041   NA 142 03/11/2012 0850   K 3.5 03/17/2012 1041   K 3.5 03/11/2012 0850   CL 101 03/17/2012 1041   CL 102 03/11/2012 0850   CO2 31 03/17/2012 1041   CO2 29 03/11/2012 0850   BUN 18 03/17/2012 1041   BUN 23.9 03/11/2012 0850   CREATININE 1.00 03/17/2012 1041   CREATININE 1.4* 03/11/2012 0850      Component Value Date/Time   CALCIUM 10.2 03/17/2012 1041   CALCIUM 10.1 03/11/2012 0850   ALKPHOS 21* 03/17/2012 1041   ALKPHOS 22* 03/11/2012 0850   AST 26 03/17/2012 1041   AST 21 03/11/2012 0850   ALT 19 03/17/2012  1041   ALT 20 03/11/2012 0850   BILITOT 0.4 03/17/2012 1041   BILITOT 0.37 03/11/2012 0850     ADDITIONAL INFORMATION: 1. CHROMOGENIC IN-SITU HYBRIDIZATION Interpretation HER-2/NEU BY CISH - NEGATIVE, NO AMPLIFICATION OF HER-2 DETECTED. THE RATIO OF HER-2: CEP 17 SIGNALS WAS 1.16 AND AVERAGE HER-2 COPY NUMBER WAS <4.0 SIGNALS/CELL. Abigail Miyamoto MD Pathologist, Electronic Signature ( Signed 03/26/2012) FINAL DIAGNOSIS Diagnosis 1. Breast, lumpectomy, Right - INVASIVE GRADE I DUCTAL CARCINOMA, SPANNING 0.9 CM IN GREATEST DIMENSION. - ASSOCIATED INTERMEDIATE GRADE DUCTAL CARCINOMA IN SITU. - DEFINITIVE LYMPH/VASCULAR INVASION IS NOT IDENTIFIED. - MARGINS  ARE NEGATIVE. - SEE ONCOLOGY TEMPLATE. 2. Lymph node, sentinel, biopsy, Right axilla - ONE BENIGN LYMPH NODE WITH NO TUMOR SEEN (0/1). - SEE COMMENT 3. Lymph node, sentinel, biopsy - ONE BENIGN LYMPH NODE WITH NO TUMOR SEEN (0/1). - SEE COMMENT 4. Lymph node, sentinel, biopsy 1 of 4 FINAL for Scheeler, Linsey S ([id number]) Diagnosis(continued) - ONE BENIGN LYMPH NODE WITH NO TUMOR SEEN (0/1). - SEE COMMENT 5. Lymph node, sentinel, biopsy - ONE BENIGN LYMPH NODE WITH NO TUMOR SEEN (0/1). - SEE COMMENT Microscopic Comment 1. BREAST, INVASIVE TUMOR, WITH LYMPH NODE SAMPLING Specimen, including laterality: Right partial breast with sentinel lymph node sampling. Procedure: Right breast lumpectomy with sentinel lymph node sampling. Grade: I . Tubule formation: 3. Nuclear pleomorphism: 1. Mitotic: 1, see comments below. Tumor size (gross measurement): 0.9 cm. Margins: Invasive, distance to closest margin: 0.6 cm (lateral margin). Lymphovascular invasion: No definitive lymph/vascular invasion identified. Ductal carcinoma in situ: Yes. Grade: Intermediate. Extensive intraductal component: No. Lobular neoplasia: No. Tumor focality: Unifocal. Treatment effect: Not applicable. Extent of tumor: Tumor confined to breast  parenchyma. Lymph nodes: # examined: 4. Lymph nodes with metastasis: 0. Breast prognostic profile: Performed on previous case [id number] Estrogen receptor: 100%, positive. Progesterone receptor: 100%, positive. Her 2 neu: 1.25, no amplification. Ki-67: 71%. Non-neoplastic breast: Fibrocystic changes with fat necrosis. TNM: pT1b, pN0, MX. Comments: Although the Ki-67 % was relatively high on the previous biopsy, significant mitotic activity is not identified morphologically. An E-Cadherin immunohistochemical stain is performed on a representative section of tumor. It is positive, confirming the ductal nature of the carcinoma. A Her-2 Neu by CISH will be repeated and reported in an addendum. 2. -5. A cytokeratin AE1/AE3 immunohistochemical stain is performed on each sentinel lymph node biopsy (four stains total). The stain is negative  RADIOGRAPHIC STUDIES:  Nm Sentinel Node Inj-no Rpt (breast)  03/18/2012  CLINICAL DATA: breast cancer   Sulfur colloid was injected intradermally by the nuclear medicine  technologist for breast cancer sentinel node localization.     Mm Rt Plc Breast Loc Dev   1st Lesion  Inc Mammo Guide  03/18/2012  *RADIOLOGY REPORT*  Clinical Data:  Biopsy-proven right breast cancer, preoperative needle localization  NEEDLE LOCALIZATION WITH MAMMOGRAPHIC GUIDANCE AND SPECIMEN RADIOGRAPH  Comparison:  Previous exams.  Patient presents for needle localization prior to lumpectomy.  I met with the patient and we discussed the procedure of needle localization including benefits and alternatives. We discussed the high likelihood of a successful procedure. We discussed the risks of the procedure, including infection, bleeding, tissue injury, and further surgery. Informed, written consent was given.  Using mammographic guidance, sterile technique, 2% lidocaine and a 7 cm modified Kopans needle, the mass and coil shaped marker are localized using a superior to inferior approach.  The  films are marked for Dr. Luisa Hart.  Specimen radiograph was performed at day Surgery, and confirms the intact hook wire and clip are present in the tissue sample. The specimen is marked for pathology.  IMPRESSION:  Needle localization right breast.  No apparent complications.   Original Report Authenticated By: Christiana Pellant, M.D.     ASSESSMENT: 79 year old female with  #1 stage I invasive ductal carcinoma status post lumpectomy sentinel node. Patient has no positive nodes. She is a good candidate for antiestrogen therapy with Aromasin 25 mg daily we discussed the rationale risks and benefits side effects. Prescription was sent to her pharmacy. She understands that we will treat her for 5 years. She will need  vitamin D calcium bone density scans. She is  on Fosamax   PLAN:   #1 Aromasin 25 mg daily.  #2 I will see her back in 3 months time for followup.   All questions were answered. The patient knows to call the clinic with any problems, questions or concerns. We can certainly see the patient much sooner if necessary.  I spent 25 minutes counseling the patient face to face. The total time spent in the appointment was 30 minutes.    Drue Second, MD Medical/Oncology [facility] [phone] (beeper) [phone] (Office)  04/07/2012, 2:01 PM

## 2012-04-09 ENCOUNTER — Telehealth: Payer: Self-pay | Admitting: *Deleted

## 2012-04-09 NOTE — Telephone Encounter (Signed)
i faxed orders over to [facility] for a bone density on 04/08/12 @1 :00pm.

## 2012-05-11 ENCOUNTER — Ambulatory Visit (INDEPENDENT_AMBULATORY_CARE_PROVIDER_SITE_OTHER): Payer: Medicare Other | Admitting: Surgery

## 2012-05-11 ENCOUNTER — Encounter (INDEPENDENT_AMBULATORY_CARE_PROVIDER_SITE_OTHER): Payer: Self-pay | Admitting: Surgery

## 2012-05-11 VITALS — BP 140/82 | HR 66 | Temp 97.5°F | Resp 18 | Ht 62.5 in | Wt 181.8 lb

## 2012-05-11 DIAGNOSIS — Z9889 Other specified postprocedural states: Secondary | ICD-10-CM

## 2012-05-11 NOTE — Progress Notes (Signed)
Patient returns after right breast lumpectomy and right axillary sentinel lymph node mapping T1 N0 MX stage I right breast cancer ER positive PR positive HER-2/neu negative. She developed a seroma that was drained by Dr. Derrell Lolling. She's finished her antibiotics. Is better.  Exam: Right breast incisions clean dry and intact. No residual seroma or signs of infection.  Impression: Stage I right breast cancer status post breast conservation  Plan: To see medical oncology next week. Return 6 MONTHS.

## 2012-05-11 NOTE — Patient Instructions (Addendum)
RETURN 6 MONTHS.

## 2012-05-12 ENCOUNTER — Encounter: Payer: Self-pay | Admitting: *Deleted

## 2012-05-12 NOTE — Progress Notes (Signed)
Mailed after appt letter to pt. 

## 2012-07-08 ENCOUNTER — Encounter: Payer: Self-pay | Admitting: Oncology

## 2012-07-08 ENCOUNTER — Ambulatory Visit (HOSPITAL_BASED_OUTPATIENT_CLINIC_OR_DEPARTMENT_OTHER): Payer: Medicare Other | Admitting: Oncology

## 2012-07-08 ENCOUNTER — Telehealth: Payer: Self-pay | Admitting: *Deleted

## 2012-07-08 ENCOUNTER — Other Ambulatory Visit (HOSPITAL_BASED_OUTPATIENT_CLINIC_OR_DEPARTMENT_OTHER): Payer: Medicare Other | Admitting: Lab

## 2012-07-08 VITALS — BP 154/74 | HR 77 | Temp 98.2°F | Resp 20 | Ht 62.5 in | Wt 182.9 lb

## 2012-07-08 DIAGNOSIS — C50411 Malignant neoplasm of upper-outer quadrant of right female breast: Secondary | ICD-10-CM

## 2012-07-08 DIAGNOSIS — C50911 Malignant neoplasm of unspecified site of right female breast: Secondary | ICD-10-CM

## 2012-07-08 DIAGNOSIS — C50919 Malignant neoplasm of unspecified site of unspecified female breast: Secondary | ICD-10-CM

## 2012-07-08 DIAGNOSIS — C50419 Malignant neoplasm of upper-outer quadrant of unspecified female breast: Secondary | ICD-10-CM

## 2012-07-08 LAB — COMPREHENSIVE METABOLIC PANEL (CC13)
ALT: 16 U/L (ref 0–55)
AST: 23 U/L (ref 5–34)
Chloride: 103 mEq/L (ref 98–107)
Creatinine: 1.3 mg/dL — ABNORMAL HIGH (ref 0.6–1.1)
Sodium: 142 mEq/L (ref 136–145)
Total Bilirubin: 0.36 mg/dL (ref 0.20–1.20)
Total Protein: 7.7 g/dL (ref 6.4–8.3)

## 2012-07-08 LAB — CBC WITH DIFFERENTIAL/PLATELET
BASO%: 0.6 % (ref 0.0–2.0)
EOS%: 1.1 % (ref 0.0–7.0)
HCT: 36.2 % (ref 34.8–46.6)
LYMPH%: 21.6 % (ref 14.0–49.7)
MCH: 29.6 pg (ref 25.1–34.0)
MCHC: 34.1 g/dL (ref 31.5–36.0)
MONO#: 0.6 10*3/uL (ref 0.1–0.9)
NEUT%: 69.7 % (ref 38.4–76.8)
RBC: 4.18 10*6/uL (ref 3.70–5.45)
WBC: 8.3 10*3/uL (ref 3.9–10.3)
lymph#: 1.8 10*3/uL (ref 0.9–3.3)

## 2012-07-08 NOTE — Telephone Encounter (Signed)
appts made and printed. Pt wanted to come in after the holidays...td

## 2012-07-08 NOTE — Patient Instructions (Addendum)
Continue aromasin pill for your breast cancer  I will see you back in 6 months

## 2012-07-08 NOTE — Progress Notes (Signed)
OFFICE PROGRESS NOTE  CC  Diane Grays, MD [address] [place] [address] Dr. Lurline Hare  Dr. Harriette Bouillon   DIAGNOSIS: 79 year old female with new diagnosis of stage I right invasive ductal carcinoma.   STAGE:  Cancer of upper-outer quadrant of female breast  Primary site: Breast (Right)  Staging method: AJCC 7th Edition  Clinical: Stage IA (T1a, N0, cM0)  Summary: Stage IA (T1a, N0, cM0)   PRIOR THERAPY: #1 79 y.o. female. With medical history significant for our do some hypertension and ostial process. Patient recently had a screening mammogram performed that revealed a 4 x 4 by 3 mm area of concern at the 11:00 position in the right breast. Patient went on to have a biopsy performed on 03/02/2002. The pathology revealed invasive mammary carcinoma with microcalcifications grade 1. Tumor was ER +100% PR +100% HER-2/neu negative with a proliferation marker Ki-67 71%. E-cadherin stain revealed the patient in fact had a ductal carcinoma phenotype. She was seen in the [facility] clinic for discussion of treatment options  #2 patient is status post right lumpectomy with sentinel lymph node biopsy performed on 03/18/2012. The final pathology revealed a 0.9 cm invasive cancer that was grade 1 with associated DCIS sentinel node was negative for metastatic disease. Tumor was ER positive PR positive HER-2/neu negative.   #3 we discussed adjuvant treatment options. I think she is a good candidate for antiestrogen therapy with Aromasin 25 mg daily. She is  on Fosamax 70 mg daily for bone health. She certainly will need to continue having mammograms as well as bone density scans. We discussed this. We discussed the risks and benefits of Aromasin therapy. She understands the rationale and is agreeable to starting this.  CURRENT THERAPY: Aromasin 25 mg daily starting 04/07/2012.  INTERVAL HISTORY: Rashanna Christiana Mccumbers 79 y.o. female returns for followup visit after  having been on aromasin for 3 months. She is tolerating it well. She is doing well without any problems. She denies any nausea vomiting fevers chills night sweats headaches no shortness of breath chest pains or palpitations. She has no fevers no pain or tenderness. Remainder of the 10 point review of systems is negative.  MEDICAL HISTORY: Past Medical History  Diagnosis Date  . Hypertension   . Elevated cholesterol   . Arthritis   . GERD (gastroesophageal reflux disease)   . Thyroid condition   . Vertigo   . Platelets decreased   . Back pain   . Sleep apnea     STOP BANG SCORE 4  . Apnea, sleep     never tested for OSA  . Breast cancer     ALLERGIES:  is allergic to amlodipine; aspirin; benazepril; minocycline; and xarelto.  MEDICATIONS:  Current Outpatient Prescriptions  Medication Sig Dispense Refill  . alendronate (FOSAMAX) 70 MG tablet Take 70 mg by mouth every 7 (seven) days. Takes on Tuesdays.  Take with a full glass of water on an empty stomach.      [name] BIOTIN PO Take 1 tablet by mouth daily.       [name] exemestane (AROMASIN) 25 MG tablet       . hydrochlorothiazide (HYDRODIURIL) 25 MG tablet Take 25 mg by mouth daily before breakfast.      . levothyroxine (SYNTHROID, LEVOTHROID) 50 MCG tablet Take 50 mcg by mouth daily before breakfast.      . Multiple Vitamins-Minerals (CENTRUM SILVER PO) Take by mouth daily.      [name]  omeprazole (PRILOSEC) 20 MG capsule Take 20 mg by mouth daily with breakfast.      . simvastatin (ZOCOR) 40 MG tablet Take 40 mg by mouth at bedtime.      . ranitidine (ZANTAC) 300 MG tablet        No current facility-administered medications for this visit.    SURGICAL HISTORY:  Past Surgical History  Procedure Laterality Date  . Appendectomy    . Vein ligation and stripping      X2  . Hernia repair      ING HERNIA REPAIR  . Hip fracture surgery    . Joint replacement      RT HIP  . Dilation and curettage of uterus    . Abdominal hysterectomy    .  Back surgery    . Some hardware removed from right hip Right     some hardware removed   . Breast lumpectomy with needle localization and axillary sentinel lymph node bx Right 03/18/2012    Procedure: BREAST LUMPECTOMY WITH NEEDLE LOCALIZATION AND AXILLARY SENTINEL LYMPH NODE BX;  Surgeon: Clovis Pu. Cornett, MD;  Location: [facility] OR;  Service: General;  Laterality: Right;  . Breast surgery      REVIEW OF SYSTEMS:  Pertinent items are noted in HPI.   HEALTH MAINTENANCE:   PHYSICAL EXAMINATION: Blood pressure 154/74, pulse 77, temperature 98.2 F (36.8 C), temperature source Oral, resp. rate 20, height 5' 2.5" (1.588 m), weight 182 lb 14.4 oz (82.963 kg). Body mass index is 32.9 kg/(m^2). ECOG PERFORMANCE STATUS: 1 - Symptomatic but completely ambulatory  Patient is awake alert in no acute distress well appearing female HEENT exam: EOMI PERRLA sclerae anicteric no conjunctival pallor oral mucosa is moist neck is supple lungs clear cardiovascular regular rate rhythm abdomen is soft nontender no HSM extremities no edema neuro nonfocal right breast healing surgical scar no masses or nodularity.   LABORATORY DATA: Lab Results  Component Value Date   WBC 8.3 07/08/2012   HGB 12.4 07/08/2012   HCT 36.2 07/08/2012   MCV 86.6 07/08/2012   PLT 111* 07/08/2012      Chemistry      Component Value Date/Time   NA 142 07/08/2012 1257   NA 143 03/17/2012 1041   K 3.5 07/08/2012 1257   K 3.5 03/17/2012 1041   CL 103 07/08/2012 1257   CL 101 03/17/2012 1041   CO2 30* 07/08/2012 1257   CO2 31 03/17/2012 1041   BUN 17.7 07/08/2012 1257   BUN 18 03/17/2012 1041   CREATININE 1.3* 07/08/2012 1257   CREATININE 1.00 03/17/2012 1041      Component Value Date/Time   CALCIUM 9.5 07/08/2012 1257   CALCIUM 10.2 03/17/2012 1041   ALKPHOS 22* 07/08/2012 1257   ALKPHOS 21* 03/17/2012 1041   AST 23 07/08/2012 1257   AST 26 03/17/2012 1041   ALT 16 07/08/2012 1257   ALT 19 03/17/2012 1041   BILITOT 0.36 07/08/2012 1257   BILITOT 0.4  03/17/2012 1041     ADDITIONAL INFORMATION: 1. CHROMOGENIC IN-SITU HYBRIDIZATION Interpretation HER-2/NEU BY CISH - NEGATIVE, NO AMPLIFICATION OF HER-2 DETECTED. THE RATIO OF HER-2: CEP 17 SIGNALS WAS 1.16 AND AVERAGE HER-2 COPY NUMBER WAS <4.0 SIGNALS/CELL. Abigail Miyamoto MD Pathologist, Electronic Signature ( Signed 03/26/2012) FINAL DIAGNOSIS Diagnosis 1. Breast, lumpectomy, Right - INVASIVE GRADE I DUCTAL CARCINOMA, SPANNING 0.9 CM IN GREATEST DIMENSION. - ASSOCIATED INTERMEDIATE GRADE DUCTAL CARCINOMA IN SITU. - DEFINITIVE LYMPH/VASCULAR INVASION IS NOT IDENTIFIED. - MARGINS ARE  NEGATIVE. - SEE ONCOLOGY TEMPLATE. 2. Lymph node, sentinel, biopsy, Right axilla - ONE BENIGN LYMPH NODE WITH NO TUMOR SEEN (0/1). - SEE COMMENT 3. Lymph node, sentinel, biopsy - ONE BENIGN LYMPH NODE WITH NO TUMOR SEEN (0/1). - SEE COMMENT 4. Lymph node, sentinel, biopsy 1 of 4 FINAL for Batres, Nezzie S ([id number]) Diagnosis(continued) - ONE BENIGN LYMPH NODE WITH NO TUMOR SEEN (0/1). - SEE COMMENT 5. Lymph node, sentinel, biopsy - ONE BENIGN LYMPH NODE WITH NO TUMOR SEEN (0/1). - SEE COMMENT Microscopic Comment 1. BREAST, INVASIVE TUMOR, WITH LYMPH NODE SAMPLING Specimen, including laterality: Right partial breast with sentinel lymph node sampling. Procedure: Right breast lumpectomy with sentinel lymph node sampling. Grade: I . Tubule formation: 3. Nuclear pleomorphism: 1. Mitotic: 1, see comments below. Tumor size (gross measurement): 0.9 cm. Margins: Invasive, distance to closest margin: 0.6 cm (lateral margin). Lymphovascular invasion: No definitive lymph/vascular invasion identified. Ductal carcinoma in situ: Yes. Grade: Intermediate. Extensive intraductal component: No. Lobular neoplasia: No. Tumor focality: Unifocal. Treatment effect: Not applicable. Extent of tumor: Tumor confined to breast parenchyma. Lymph nodes: # examined: 4. Lymph nodes with metastasis: 0. Breast  prognostic profile: Performed on previous case [id number] Estrogen receptor: 100%, positive. Progesterone receptor: 100%, positive. Her 2 neu: 1.25, no amplification. Ki-67: 71%. Non-neoplastic breast: Fibrocystic changes with fat necrosis. TNM: pT1b, pN0, MX. Comments: Although the Ki-67 % was relatively high on the previous biopsy, significant mitotic activity is not identified morphologically. An E-Cadherin immunohistochemical stain is performed on a representative section of tumor. It is positive, confirming the ductal nature of the carcinoma. A Her-2 Neu by CISH will be repeated and reported in an addendum. 2. -5. A cytokeratin AE1/AE3 immunohistochemical stain is performed on each sentinel lymph node biopsy (four stains total). The stain is negative  RADIOGRAPHIC STUDIES:  Nm Sentinel Node Inj-no Rpt (breast)  03/18/2012  CLINICAL DATA: breast cancer   Sulfur colloid was injected intradermally by the nuclear medicine  technologist for breast cancer sentinel node localization.     Mm Rt Plc Breast Loc Dev   1st Lesion  Inc Mammo Guide  03/18/2012  *RADIOLOGY REPORT*  Clinical Data:  Biopsy-proven right breast cancer, preoperative needle localization  NEEDLE LOCALIZATION WITH MAMMOGRAPHIC GUIDANCE AND SPECIMEN RADIOGRAPH  Comparison:  Previous exams.  Patient presents for needle localization prior to lumpectomy.  I met with the patient and we discussed the procedure of needle localization including benefits and alternatives. We discussed the high likelihood of a successful procedure. We discussed the risks of the procedure, including infection, bleeding, tissue injury, and further surgery. Informed, written consent was given.  Using mammographic guidance, sterile technique, 2% lidocaine and a 7 cm modified Kopans needle, the mass and coil shaped marker are localized using a superior to inferior approach.  The films are marked for Dr. Luisa Hart.  Specimen radiograph was performed at day  Surgery, and confirms the intact hook wire and clip are present in the tissue sample. The specimen is marked for pathology.  IMPRESSION:  Needle localization right breast.  No apparent complications.   Original Report Authenticated By: Christiana Pellant, M.D.     ASSESSMENT: 79 year old female with  #1 stage I invasive ductal carcinoma status post lumpectomy sentinel node. Patient has no positive nodes. She is a good candidate for antiestrogen therapy with Aromasin 25 mg daily we discussed the rationale risks and benefits side effects. Prescription was sent to her pharmacy. She understands that we will treat her for 5 years. She will need vitamin  D calcium bone density scans. She is  on Fosamax  #2 No evidence of recurrent disease   PLAN:   #1 Continue  Aromasin 25 mg daily.  #2 I will see her back in 3 months time for followup.   All questions were answered. The patient knows to call the clinic with any problems, questions or concerns. We can certainly see the patient much sooner if necessary.  I spent 25 minutes counseling the patient face to face. The total time spent in the appointment was 30 minutes.    Drue Second, MD Medical/Oncology [facility] [phone] (beeper) [phone] (Office)  07/08/2012, 1:57 PM

## 2012-09-29 ENCOUNTER — Telehealth: Payer: Self-pay | Admitting: Internal Medicine

## 2012-09-29 NOTE — Telephone Encounter (Signed)
Returned call.  No answer/voicemail.  Will await return call.  As no documentation of a call from office.

## 2012-09-29 NOTE — Telephone Encounter (Signed)
Returning your call. °

## 2012-11-20 ENCOUNTER — Ambulatory Visit (INDEPENDENT_AMBULATORY_CARE_PROVIDER_SITE_OTHER): Payer: Medicare Other | Admitting: Cardiology

## 2012-11-20 ENCOUNTER — Encounter: Payer: Self-pay | Admitting: Cardiology

## 2012-11-20 VITALS — BP 158/80 | HR 85 | Ht 63.0 in | Wt 186.5 lb

## 2012-11-20 DIAGNOSIS — R209 Unspecified disturbances of skin sensation: Secondary | ICD-10-CM

## 2012-11-20 DIAGNOSIS — R200 Anesthesia of skin: Secondary | ICD-10-CM

## 2012-11-20 DIAGNOSIS — E669 Obesity, unspecified: Secondary | ICD-10-CM

## 2012-11-20 DIAGNOSIS — M79604 Pain in right leg: Secondary | ICD-10-CM

## 2012-11-20 DIAGNOSIS — I10 Essential (primary) hypertension: Secondary | ICD-10-CM

## 2012-11-20 DIAGNOSIS — I83893 Varicose veins of bilateral lower extremities with other complications: Secondary | ICD-10-CM

## 2012-11-20 DIAGNOSIS — M79609 Pain in unspecified limb: Secondary | ICD-10-CM

## 2012-11-20 NOTE — Patient Instructions (Addendum)
Your physician has requested that you have a carotid duplex for facial numbness . This test is an ultrasound of the carotid arteries in your neck. It looks at blood flow through these arteries that supply the brain with blood. Allow one hour for this exam. There are no restrictions or special instructions.  Your physician has requested that you have a renal artery duplex for HTN. During this test, an ultrasound is used to evaluate blood flow to the kidneys. Allow one hour for this exam. Do not eat after midnight the day before and avoid carbonated beverages. Take your medications as you usually do.  Your physician has requested that you have a lower  venous duplex FOR EDEMA,VARICOSE VEINS . This test is an ultrasound of the veins in the legs . It looks at venous blood flow that carries blood from the heart to the legs . Allow one hour for a Lower Venous exam.  There are no restrictions or special instructions.  Your physician recommends that you schedule a follow-up appointment in after test are completed.

## 2012-11-22 ENCOUNTER — Encounter: Payer: Self-pay | Admitting: Cardiology

## 2012-11-22 DIAGNOSIS — E669 Obesity, unspecified: Secondary | ICD-10-CM | POA: Insufficient documentation

## 2012-11-22 DIAGNOSIS — M79604 Pain in right leg: Secondary | ICD-10-CM | POA: Insufficient documentation

## 2012-11-22 DIAGNOSIS — I10 Essential (primary) hypertension: Secondary | ICD-10-CM | POA: Insufficient documentation

## 2012-11-22 DIAGNOSIS — R200 Anesthesia of skin: Secondary | ICD-10-CM | POA: Insufficient documentation

## 2012-11-22 DIAGNOSIS — I83893 Varicose veins of bilateral lower extremities with other complications: Secondary | ICD-10-CM | POA: Insufficient documentation

## 2012-11-22 NOTE — Assessment & Plan Note (Addendum)
I think maybe some of her leg numbness and burning this is suggestive of peripheral neuropathy, may very well be related to venous reflux.  Plan: Lower Extremity Venous Duplex Dopplers to look for venous insufficiency : Support hose versus compression stockings.

## 2012-11-22 NOTE — Progress Notes (Signed)
PCP: Herb Grays, MD  Clinic Note: Chief Complaint  Patient presents with  . Establish Care    Blood pressure is elevated, pain in legs when walking since July-she was put on prednisone, and headaches.   HPI: Diane Lucas is a 79 y.o. female with a PMH below who presents today for evaluation and treatment of hypertension, other cardiovascular symptoms noted as well. She states over the last year she's had several different diagnoses and has recently undergone lumpectomy for breast cancer. She was put on prednisone for what was thought to be possible rheumatoid x-ray or some other connective tissue disorder do to diffuse fatigue and difficulty walking. She also had a cholesterol medicine held with the consideration of possible statin intolerance. This has helped some, but she still activity. She presents here because over last several months her blood pressure has been persistently elevated. She is intolerant of ACE inhibitors and amlodipine with significant swelling. She does note that she has bilateral leg swelling baseline.  Interval History: Today she presented with persistently elevated blood pressure. She does have headaches they're sometimes frontal sometimes occipital. Usually she knows what her blood pressures going higher she gets this headache. On that she woke up with numbness and facial weakness on the left side, at that time she noted that her systolic blood pressure was 197 mmHg. She also notes bilateral leg cramping that has improved being off the statin, but is still there. Notes aching and burning sensation in her feet. Says her legs feel heavy and thick on walking.  Other than the unilateral headache, she denies any unilateral weakness or numbness to consistent with TIA or amaurosis fugax symptoms. She has not had any chest tightness or pressure with rest or exertion. No PND, orthopnea to go along with a mild edema. She denies any syncope or near-syncope. No palpitations or  rapid heart beats. No claudication.  Past Medical History  Diagnosis Date  . Hypertension     Labile; with recent increase in pressures.  . Elevated cholesterol   . Arthritis   . GERD (gastroesophageal reflux disease)   . Thyroid condition   . Vertigo   . Platelets decreased   . Back pain   . Sleep apnea     STOP BANG SCORE 4  . Apnea, sleep     never tested for OSA  . Breast cancer    Prior Cardiac Evaluation and Past Surgical History: Past Surgical History  Procedure Laterality Date  . Appendectomy    . Vein ligation and stripping      X2  . Hernia repair      ING HERNIA REPAIR  . Hip fracture surgery    . Joint replacement      RT HIP  . Dilation and curettage of uterus    . Abdominal hysterectomy    . Back surgery    . Some hardware removed from right hip Right     some hardware removed   . Breast lumpectomy with needle localization and axillary sentinel lymph node bx Right 03/18/2012    Procedure: BREAST LUMPECTOMY WITH NEEDLE LOCALIZATION AND AXILLARY SENTINEL LYMPH NODE BX;  Surgeon: Clovis Pu. Cornett, MD;  Location: [facility] OR;  Service: General;  Laterality: Right;  . Breast surgery      Allergies  Allergen Reactions  . Amlodipine Swelling  . Aspirin Other (See Comments)    REACTION: causes bleeding  . Benazepril Swelling    Of tongue.  . Minocycline Swelling  .  Xarelto [Rivaroxaban] Other (See Comments)    REACTION: causes nosebleeds    Current Outpatient Prescriptions  Medication Sig Dispense Refill  . alendronate (FOSAMAX) 70 MG tablet Take 70 mg by mouth every 7 (seven) days. Takes on Tuesdays.  Take with a full glass of water on an empty stomach.      [name] BIOTIN PO Take 1 tablet by mouth daily.       [name] diltiazem (CARDIZEM) 60 MG tablet Take 0.5 tablet (30 mg total) by mouth every morning and 1 tablet (60 mg total) by mouth every evening.      [name] exemestane (AROMASIN) 25 MG tablet       . hydrochlorothiazide (HYDRODIURIL) 25 MG tablet Take 25 mg by mouth  daily before breakfast.      . levothyroxine (SYNTHROID, LEVOTHROID) 50 MCG tablet Take 50 mcg by mouth daily before breakfast.      . Multiple Vitamins-Minerals (CENTRUM SILVER PO) Take by mouth daily.      [name] omeprazole (PRILOSEC) 20 MG capsule Take 20 mg by mouth daily with breakfast.      . predniSONE (DELTASONE) 5 MG tablet Take 2.5 mg by mouth daily.       No current facility-administered medications for this visit.    History   Social History  . Marital Status: Married    Spouse Name: N/A    Number of Children: N/A  . Years of Education: N/A   Occupational History  . Not on file.   Social History Main Topics  . Smoking status: Never Smoker   . Smokeless tobacco: Never Used  . Alcohol Use: No  . Drug Use: No  . Sexual Activity: Not Currently   Other Topics Concern  . Not on file   Social History Narrative  . No narrative on file   Family History: family history includes Bladder Cancer in her maternal grandfather; Prostate cancer in her brother.   ROS: A comprehensive Review of Systems - Negative except Mostly symptoms noted above. Otherwise General ROS: positive for  - fatigue Musculoskeletal ROS: positive for - Hand and leg fatigue. Difficulty walking. Slow gait Neurological ROS: Headaches. So have been occipital while others have been unilateral left-sided her face. It usually associated with hypertension.  PHYSICAL EXAM BP 158/80  Pulse 85  Ht 5\' 3"  (1.6 m)  Wt 186 lb 8 oz (84.596 kg)  BMI 33.05 kg/m2 General appearance: alert, cooperative, appears stated age, no distress, mildly obese and Healthy-appearing, well-nourished and well-groomed. Answers questions appropriately. Neck: no adenopathy, no carotid bruit, no JVD, supple, symmetrical, trachea midline and thyroid not enlarged, symmetric, no tenderness/mass/nodules Lungs: clear to auscultation bilaterally, normal percussion bilaterally and Nonlabored, good air movement Heart: regular rate and rhythm, S1,  S2 normal, no murmur, click, rub or gallop and normal apical impulse Abdomen: soft, non-tender; bowel sounds normal; no masses,  no organomegaly Extremities: No C./C. with trace edema. Mild varicose veins noted with mild venous stasis. Pulses: 2+ and symmetric Neurologic: Mental status: Alert, oriented, thought content appropriate, affect: normal Cranial nerves: normal Motor: grossly normal HEENT: [place]/AT, EOMI, MMM, anicteric sclera  [id number] today: Yes Rate: 85 , Rhythm: NSR, normal ECG;  Recent Labs: None presently checked. Creatinine was 1.3 and potassium 3.5 in June -- relatively stable  ASSESSMENT / PLAN: HTN (hypertension) Her blood pressure is indeed quite labile. There are limited with medications continues based on her swelling with amlodipine and ACE inhibitor. She seems to be having a new type of headache with  the diltiazem, think for now we should stick with the medicine she has to see if she can tolerate it with gradual titration.  With relatively new onset of difficult control hypertension, it would be unlikely that she would have some type of hormonal abnormality, and her potassium was relatively normal back in June of this began. We will however check Renal Artery Dopplers to look for reversible cause.  Plan: Renal Artery Dopplers; increase to 60 mg twice a day diltiazem and continue HCTZ. If diltiazem continues to be inconsistent or inadequate, I would consider actually switching her over to a beta blocker such as carvedilol or Bystolic.  Varicose veins of both legs with edema I think maybe some of her leg numbness and burning this is suggestive of peripheral neuropathy, may very well be related to venous reflux.  Plan: Lower Extremity Venous Duplex Dopplers to look for venous insufficiency : Support hose versus compression stockings.  Leg pain, bilateral See above: Venous Dopplers and support hose/compression stockings  Facial numbness A little bit concerned of  the unilateral headaches with hypertension. This sounds like it could potentially be TIA-type symptoms versus temporal arteritis.  Plan: Carotid Dopplers  Obesity (BMI 30-39.9) Discussed dietary modifications and exercise. Hopefully we can resolve her leg pain issues that would allow her to get back to exercising.    Orders Placed This Encounter  Procedures  . EKG 12-Lead  . Lower Extremity Venous Duplex Bilateral    Standing Status: Future     Number of Occurrences:      Standing Expiration Date: 11/20/2013    Scheduling Instructions:     VARICOSE VEINS, PAIN LEGS    Order Specific Question:  Laterality    Answer:  Bilateral    Order Specific Question:  Where should this test be performed:    Answer:  [facility]  . Doppler carotid    Standing Status: Future     Number of Occurrences:      Standing Expiration Date: 11/20/2013    Order Specific Question:  Laterality    Answer:  Bilateral    Order Specific Question:  Where should this test be performed:    Answer:  [facility]  . Renal Artery Duplex Bilateral    Standing Status: Future     Number of Occurrences:      Standing Expiration Date: 11/20/2013    Order Specific Question:  Laterality    Answer:  Bilateral    Order Specific Question:  Where should this test be performed:    Answer:  [facility]   Followup: Roughly 1 month  Alysabeth Scalia W. Herbie Baltimore, M.D., M.S. [facility] [address] [place], [place]  [address]  [phone] Pager # [phone]

## 2012-11-22 NOTE — Assessment & Plan Note (Addendum)
Discussed dietary modifications and exercise. Hopefully we can resolve her leg pain issues that would allow her to get back to exercising.

## 2012-11-22 NOTE — Assessment & Plan Note (Signed)
A little bit concerned of the unilateral headaches with hypertension. This sounds like it could potentially be TIA-type symptoms versus temporal arteritis.  Plan: Carotid Dopplers

## 2012-11-22 NOTE — Assessment & Plan Note (Signed)
Her blood pressure is indeed quite labile. There are limited with medications continues based on her swelling with amlodipine and ACE inhibitor. She seems to be having a new type of headache with the diltiazem, think for now we should stick with the medicine she has to see if she can tolerate it with gradual titration.  With relatively new onset of difficult control hypertension, it would be unlikely that she would have some type of hormonal abnormality, and her potassium was relatively normal back in June of this began. We will however check Renal Artery Dopplers to look for reversible cause.  Plan: Renal Artery Dopplers; increase to 60 mg twice a day diltiazem and continue HCTZ. If diltiazem continues to be inconsistent or inadequate, I would consider actually switching her over to a beta blocker such as carvedilol or Bystolic.

## 2012-11-22 NOTE — Assessment & Plan Note (Signed)
See above: Venous Dopplers and support hose/compression stockings

## 2012-11-23 ENCOUNTER — Encounter: Payer: Self-pay | Admitting: Cardiology

## 2012-11-26 ENCOUNTER — Encounter (HOSPITAL_COMMUNITY): Payer: Self-pay | Admitting: *Deleted

## 2012-11-30 ENCOUNTER — Ambulatory Visit (INDEPENDENT_AMBULATORY_CARE_PROVIDER_SITE_OTHER): Payer: Medicare Other | Admitting: Surgery

## 2012-11-30 ENCOUNTER — Encounter (INDEPENDENT_AMBULATORY_CARE_PROVIDER_SITE_OTHER): Payer: Self-pay | Admitting: Surgery

## 2012-11-30 VITALS — BP 162/70 | HR 84 | Resp 16 | Ht 63.0 in | Wt 188.4 lb

## 2012-11-30 DIAGNOSIS — Z853 Personal history of malignant neoplasm of breast: Secondary | ICD-10-CM

## 2012-11-30 NOTE — Progress Notes (Signed)
NAME: Diane Lucas       DOB: Jun 04, 1930           DATE: 11/30/2012        MRN: [id number]  CC:   Chief Complaint  Patient presents with  . Follow-up    6 mo reck    Diane Lucas is a 79 y.o.Marland Kitchenfemale   79 year old female with new diagnosis of stage I right invasive ductal carcinoma.  STAGE:  Cancer of upper-outer quadrant of female breast  Primary site: Breast (Right)  Staging method: AJCC 7th Edition  Clinical: Stage IA (T1a, N0, cM0)  Summary: Stage IA (T1a, N0, cM0)  PRIOR THERAPY:  #1 79 y.o. female. With medical history significant for our do some hypertension and ostial process. Patient recently had a screening mammogram performed that revealed a 4 x 4 by 3 mm area of concern at the 11:00 position in the right breast. Patient went on to have a biopsy performed on 03/02/2002. The pathology revealed invasive mammary carcinoma with microcalcifications grade 1. Tumor was ER +100% PR +100% HER-2/neu negative with a proliferation marker Ki-67 71%. E-cadherin stain revealed the patient in fact had a ductal carcinoma phenotype. She was seen in the [facility] clinic for discussion of treatment options  #2 patient is status post right lumpectomy with sentinel lymph node biopsy performed on 03/18/2012. The final pathology revealed a 0.9 cm invasive cancer that was grade 1 with associated DCIS sentinel node was negative for metastatic disease. Tumor was ER positive PR positive HER-2/neu negative.  Pt has issues with myalgias and blood pressure.  No breast issues.   PFSH: She has had no significant changes since the last visit here.  ROS: There have been no significant changes since the last visit here  EXAM:  VS: BP 162/70  Pulse 84  Resp 16  Ht 5\' 3"  (1.6 m)  Wt 188 lb 6.4 oz (85.458 kg)  BMI 33.38 kg/m2  General: The patient is alert, oriented, generally healthy appearing, NAD. Mood and affect are normal.  Breasts:  right breast shows scar but no evidence of recurrent mass.  Left  breast normal.  Lymphatics: She has no axillary or supraclavicular adenopathy on either side.  Extremities: Full ROM of the surgical side with no lymphedema noted.  Data Reviewed: Notes from oncology   Impression: Doing well, with no evidence of recurrent cancer or new cancer  Plan: Will continue to follow up in 6 months here.mammogram due January 2015. Continue Aromasin

## 2012-11-30 NOTE — Patient Instructions (Signed)
Return 6 months

## 2012-12-01 ENCOUNTER — Ambulatory Visit: Payer: Medicare Other | Admitting: Internal Medicine

## 2012-12-08 ENCOUNTER — Ambulatory Visit (HOSPITAL_COMMUNITY)
Admission: RE | Admit: 2012-12-08 | Discharge: 2012-12-08 | Disposition: A | Discharge status: Home / Self Care | Payer: Medicare Other | Source: Ambulatory Visit | Attending: Cardiology | Admitting: Cardiology

## 2012-12-08 DIAGNOSIS — I10 Essential (primary) hypertension: Secondary | ICD-10-CM | POA: Insufficient documentation

## 2012-12-08 NOTE — Progress Notes (Signed)
Renal Artery Duplex Completed. °Brianna L Mazza,RVT °

## 2012-12-15 ENCOUNTER — Ambulatory Visit (HOSPITAL_COMMUNITY)
Admission: RE | Admit: 2012-12-15 | Discharge: 2012-12-15 | Disposition: A | Discharge status: Home / Self Care | Payer: Medicare Other | Source: Ambulatory Visit | Attending: Internal Medicine | Admitting: Internal Medicine

## 2012-12-15 DIAGNOSIS — M7989 Other specified soft tissue disorders: Secondary | ICD-10-CM

## 2012-12-15 DIAGNOSIS — I83893 Varicose veins of bilateral lower extremities with other complications: Secondary | ICD-10-CM

## 2012-12-15 DIAGNOSIS — M79604 Pain in right leg: Secondary | ICD-10-CM

## 2012-12-15 DIAGNOSIS — I8390 Asymptomatic varicose veins of unspecified lower extremity: Secondary | ICD-10-CM | POA: Insufficient documentation

## 2012-12-15 DIAGNOSIS — M79609 Pain in unspecified limb: Secondary | ICD-10-CM

## 2012-12-15 DIAGNOSIS — R609 Edema, unspecified: Secondary | ICD-10-CM | POA: Insufficient documentation

## 2012-12-15 HISTORY — PX: OTHER SURGICAL HISTORY: SHX169

## 2012-12-15 NOTE — Progress Notes (Signed)
Venous Duplex Lower Ext. Completed. Negative for DVT. Positive for venous insufficiency in the left leg.  Marilynne Halsted, BS, RDMS, RVT

## 2012-12-16 ENCOUNTER — Telehealth: Payer: Self-pay | Admitting: Oncology

## 2012-12-21 ENCOUNTER — Telehealth: Payer: Self-pay | Admitting: Cardiology

## 2012-12-21 NOTE — Telephone Encounter (Signed)
Returned patient's call. Patient states that over last 2 days BP has been higher than usual - some days BP 168/80 to 172/81, lowest 136-146 systolic. Takes 60mg  diltiazem & 25mg  HCTZ in AM and 60mg  diltiazem in PM (intolerant to amlodipine & ACE-I). Reports BP usually higher in AM and decreases throughout day. Patient reports not feeling the greatest when BP is this high. Patient denies CP/SOB. Informed patient that PA or pharmacist would be notified of BPs and she would be advised of any potential med changes,  but that since she has a follow up visit scheduled for next week, that if she did not hear back that she should continue taking medications as prescribed and bring list of BPs with her to OV.   ** patient was first seen by Dr. Herbie Baltimore 11/7 as new patient for elevated/labile BP

## 2012-12-21 NOTE — Telephone Encounter (Signed)
Advise pt to continue with current meds, watch sodium intake carefully, keep appt with Dr. Allyson Sabal on Dec 18.  Reluctant to add med with her sensitivit1es w/o seeing her. Belenda Cruise

## 2012-12-21 NOTE — Telephone Encounter (Signed)
Blood pressure still up,wants to know if her medicine might need to be adjusted?[name]

## 2012-12-21 NOTE — Telephone Encounter (Signed)
Called patient with advice per Belenda Cruise. Patient verbalized understanding.

## 2012-12-22 ENCOUNTER — Encounter (HOSPITAL_COMMUNITY): Payer: Medicare Other

## 2012-12-25 ENCOUNTER — Telehealth: Payer: Self-pay | Admitting: *Deleted

## 2012-12-25 NOTE — Telephone Encounter (Signed)
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            Marykay Lex, MD       ------

## 2012-12-25 NOTE — Telephone Encounter (Signed)
Left message on answering machine results -next appoinment 01/22/12 Any question may call back 

## 2012-12-25 NOTE — Telephone Encounter (Signed)
Left message on answering machine results -next appoinment 01/22/12 Any question may call back

## 2012-12-25 NOTE — Telephone Encounter (Signed)
Message copied by Tobin Chad on Fri Dec 25, 2012 11:54 AM ------      Message from: Herbie Baltimore, DAVID W      Created: Fri Dec 18, 2012  5:20 PM       No sign of DVT.  The existing superficial veins are quite tortuous & indeed do have signs of reflux.  Unfortunately, they may not be amenable to percutaneous ablation.            Recommend support stockings / compression hose.              If Sx worsen, we can consider referral to [facility] for other Rx options.            Marykay Lex, MD       ------

## 2012-12-29 ENCOUNTER — Ambulatory Visit (HOSPITAL_COMMUNITY)
Admission: RE | Admit: 2012-12-29 | Discharge: 2012-12-29 | Disposition: A | Discharge status: Home / Self Care | Payer: Medicare Other | Source: Ambulatory Visit | Attending: Cardiology | Admitting: Cardiology

## 2012-12-29 ENCOUNTER — Encounter (INDEPENDENT_AMBULATORY_CARE_PROVIDER_SITE_OTHER): Payer: Self-pay

## 2012-12-29 ENCOUNTER — Telehealth: Payer: Self-pay | Admitting: *Deleted

## 2012-12-29 DIAGNOSIS — I6529 Occlusion and stenosis of unspecified carotid artery: Secondary | ICD-10-CM

## 2012-12-29 DIAGNOSIS — R200 Anesthesia of skin: Secondary | ICD-10-CM

## 2012-12-29 DIAGNOSIS — I672 Cerebral atherosclerosis: Secondary | ICD-10-CM | POA: Insufficient documentation

## 2012-12-29 HISTORY — PX: OTHER SURGICAL HISTORY: SHX169

## 2012-12-29 NOTE — Progress Notes (Signed)
Carotid Duplex Completed. Gean Laursen, BS, RDMS, RVT  

## 2012-12-29 NOTE — Telephone Encounter (Signed)
Walk-In Message to call r/t activating MyChart account.  Returned call and left message that letter will be mailed w/ instructions and phone number to call for assistance with activation. 

## 2012-12-31 ENCOUNTER — Ambulatory Visit: Payer: Medicare Other | Admitting: Cardiovascular Disease

## 2013-01-12 ENCOUNTER — Other Ambulatory Visit: Payer: Self-pay | Admitting: Oncology

## 2013-01-12 DIAGNOSIS — Z853 Personal history of malignant neoplasm of breast: Secondary | ICD-10-CM

## 2013-01-13 NOTE — Telephone Encounter (Signed)
Open error 

## 2013-01-18 ENCOUNTER — Other Ambulatory Visit: Payer: Medicare Other | Admitting: Lab

## 2013-01-18 ENCOUNTER — Ambulatory Visit: Payer: Medicare Other | Admitting: Oncology

## 2013-01-21 ENCOUNTER — Encounter: Payer: Self-pay | Admitting: Cardiology

## 2013-01-21 ENCOUNTER — Ambulatory Visit (INDEPENDENT_AMBULATORY_CARE_PROVIDER_SITE_OTHER): Payer: Medicare Other | Admitting: Cardiology

## 2013-01-21 VITALS — BP 144/62 | HR 88 | Ht 63.0 in | Wt 185.2 lb

## 2013-01-21 DIAGNOSIS — E669 Obesity, unspecified: Secondary | ICD-10-CM

## 2013-01-21 DIAGNOSIS — M79604 Pain in right leg: Secondary | ICD-10-CM

## 2013-01-21 DIAGNOSIS — E785 Hyperlipidemia, unspecified: Secondary | ICD-10-CM

## 2013-01-21 DIAGNOSIS — E782 Mixed hyperlipidemia: Secondary | ICD-10-CM

## 2013-01-21 DIAGNOSIS — M79609 Pain in unspecified limb: Secondary | ICD-10-CM

## 2013-01-21 DIAGNOSIS — I83893 Varicose veins of bilateral lower extremities with other complications: Secondary | ICD-10-CM

## 2013-01-21 DIAGNOSIS — Z79899 Other long term (current) drug therapy: Secondary | ICD-10-CM

## 2013-01-21 DIAGNOSIS — M79605 Pain in left leg: Secondary | ICD-10-CM

## 2013-01-21 DIAGNOSIS — I10 Essential (primary) hypertension: Secondary | ICD-10-CM

## 2013-01-21 MED ORDER — DILTIAZEM HCL 60 MG PO TABS: 90.0000 mg | ORAL_TABLET | Freq: Two times a day (BID) | ORAL | Status: DC

## 2013-01-21 NOTE — Assessment & Plan Note (Signed)
She does have venous reflux mostly left leg. I don't think this is all related to the weakness she is having, but may have something to do with some of the burning and numbness. Not very much amenable to percutaneous interventions, would consider compression stockings for support hose.

## 2013-01-21 NOTE — Patient Instructions (Signed)
Increase diltiazem  90mg    ( 1 AND 1/2 TABLETS )  TWICE A DAY  PLEASE LABS CHECKED WHEN YOU GO TO CANCER CENTER  Your physician wants you to follow-up in 6 MONTHS  Dr Ellyn Hack.You will receive a reminder letter in the mail two months in advance. If you don't receive a letter, please call our office to schedule the follow-up appointment.

## 2013-01-21 NOTE — Assessment & Plan Note (Signed)
Labile blood pressure, not related to renal artery stenosis. Her blood pressures are still been relatively elevated at home, I will increase her diltiazem to 90 mg twice a day. This is in addition to the HCTZ. Target systolic blood pressure range for her she 120-150 mmHg.

## 2013-01-21 NOTE — Assessment & Plan Note (Addendum)
She recently had a simvastatin stopped for cramping. She has not had a recheck of her lipids and LFTs since stopping the statin. We'll recheck lipids/LFTs to determine how close we are to control.

## 2013-01-21 NOTE — Assessment & Plan Note (Signed)
Again, hopefully she is able to get up move around more she lives away. She just is having a hard time burning calories with her limited mobility

## 2013-01-21 NOTE — Progress Notes (Signed)
PCP: Florina Ou, MD  Clinic Note: Chief Complaint  Patient presents with  . Follow-up    DOPPLER RESULTS, NO CHEST PAIN - FUNNY FEELING AT NIGHT, NO SOB, NO EDEMA   HPI: Diane Lucas is a 80 y.o. female with a PMH below who presents today for followup evaluation and treatment of hypertension, other cardiovascular symptoms noted as well. She states over the last year she's had several different diagnoses --    lumpectomy for breast cancer in March.   She was put on prednisone for what was thought to be possible rheumatoid x-ray or some other connective tissue disorder do to diffuse fatigue and difficulty walking.   She also had a cholesterol medicine held with the consideration of possible statin intolerance. This has helped some, but she still activity.   She was referred to me because over last several months her blood pressure has been persistently elevated. She is intolerant of ACE inhibitors and amlodipine with significant swelling. She does note that she has bilateral leg swelling baseline.  When I saw her we checked carotid Dopplers which showed mild disease, renal arterial Dopplers for hypertension which again showed mild to moderate disease. She had lower extremity venous Dopplers which showed no thrombus or thrombophlebitis, but did show some venous insufficiency in very tortuous vessels that would not be amenable to percutaneous catheter ablation.  Interval History: She comes in today still very limited as far as her activity can do most because of back pain and pain in her left leg. She has limited range of motion of the left leg with weakness involved. She definitely had improvement in the cramping sensation since stopping the simvastatin (of note she was told of the potential interaction of simvastatin and diltiazem). She initially had a headache the diltiazem but that is improved and her blood pressures have been relatively stable and she brings in recordings of blood  pressures in the 509T to 267T systolic over 24P to 80D diastolic. Very few readings have been above 983 mmHg systolic. Despite these blood pressure readings, she denies any worsening of her headaches or blurred vision. Has actually improved since her blood pressures improved. She denies any chest tightness or pressure as well as dyspnea with rest or exertion. No PND, orthopnea, with some mild lower extremity edema. No rapid or irregular heartbeats/palpitations. No lightheadedness, dizziness, wooziness or syncope/near syncope, no TIA or amaurosis fugax symptoms.   Past Medical History  Diagnosis Date  . Hypertension     Labile; with recent increase in pressures; renal arterial Dopplers 11/2012 mild to moderate left renal artery stenosis, does not explain hypertension.  . Elevated cholesterol   . Arthritis   . GERD (gastroesophageal reflux disease)   . Thyroid condition   . Vertigo   . Platelets decreased   . Back pain   . Sleep apnea     STOP BANG SCORE 4  . Apnea, sleep     never tested for OSA  . Breast cancer    Prior Cardiac Evaluation and Past Surgical History: Past Surgical History  Procedure Laterality Date  . Appendectomy    . Vein ligation and stripping      X2  . Hernia repair      ING HERNIA REPAIR  . Hip fracture surgery    . Joint replacement      RT HIP  . Dilation and curettage of uterus    . Abdominal hysterectomy    . Back surgery    .  Some hardware removed from right hip Right     some hardware removed   . Breast lumpectomy with needle localization and axillary sentinel lymph node bx Right 03/18/2012    Procedure: BREAST LUMPECTOMY WITH NEEDLE LOCALIZATION AND AXILLARY SENTINEL LYMPH NODE BX;  Surgeon: Joyice Faster. Cornett, MD;  Location: [facility];  Service: General;  Laterality: Right;  . Breast surgery    . Carotid doppler Bilateral 12/29/2012    Continued mild to moderate stenosis; less than 50%. Increased velocities in right carotid. Followup 1 year.  . Lower  extremity venous duplex Bilateral 12/15/2012    No DVT. Tortuous superficial veins with - greater than 3 seconds of the others this is a note in the left accessory saphenous vein and no insufficiency in the right or left small saphenous veins. The greater saphenous vein show history of vein stripping.    Allergies  Allergen Reactions  . Amlodipine Swelling  . Aspirin Other (See Comments)    REACTION: causes bleeding  . Benazepril Swelling    Of tongue.  . Minocycline Swelling  . Xarelto [Rivaroxaban] Other (See Comments)    REACTION: causes nosebleeds    Current Outpatient Prescriptions  Medication Sig Dispense Refill  . acetaminophen (TYLENOL) 500 MG tablet Take 2 tablet twice a day      . alendronate (FOSAMAX) 70 MG tablet Take 70 mg by mouth every 7 (seven) days. Takes on Tuesdays.  Take with a full glass of water on an empty stomach.      [name] BIOTIN PO Take 1 tablet by mouth daily.       [name] diltiazem (CARDIZEM) 60 MG tablet Take 1.5 tablets (90 mg total) by mouth 2 (two) times daily.  270 tablet  3  . exemestane (AROMASIN) 25 MG tablet Take by mouth daily after breakfast.       . hydrochlorothiazide (HYDRODIURIL) 25 MG tablet Take 25 mg by mouth daily before breakfast.      . levothyroxine (SYNTHROID, LEVOTHROID) 50 MCG tablet Take 50 mcg by mouth daily before breakfast.      . Multiple Vitamins-Minerals (CENTRUM SILVER PO) Take by mouth daily.      [name] omeprazole (PRILOSEC) 20 MG capsule Take 20 mg by mouth daily with breakfast.       No current facility-administered medications for this visit.    History   Social History  . Marital Status: Married    Spouse Name: N/A    Number of Children: N/A  . Years of Education: N/A   Occupational History  . Not on file.   Social History Main Topics  . Smoking status: Never Smoker   . Smokeless tobacco: Never Used  . Alcohol Use: No  . Drug Use: No  . Sexual Activity: Not Currently   Other Topics Concern  . Not on file    Social History Narrative   She is married, and the primary caregiver for her husband who is somewhat disabled. She does not smoke, never smoked. Does not drink alcohol.   Limited mobility due to her current condition.   Family History: family history includes Bladder Cancer in her maternal grandfather; Prostate cancer in her brother.   ROS: A comprehensive Review of Systems - Negative except Mostly symptoms noted above. Otherwise General ROS: positive for  - fatigue Musculoskeletal ROS: positive for - Hand and leg fatigue.  Low back pain radiating down the left leg. Difficulty walking. Slow gait; left leg weakness Neurological ROS: Headaches have improved. She does note  some left leg weakness.   PHYSICAL EXAM BP 144/62  Pulse 88  Ht 5\' 3"  (1.6 m)  Wt 185 lb 3.2 oz (84.006 kg)  BMI 32.81 kg/m2 General appearance: alert, cooperative, appears stated age, no distress, mildly obese and Healthy-appearing, well-nourished and well-groomed. Answers questions appropriately. Neck: no adenopathy, no carotid bruit, no JVD, supple, symmetrical, trachea midline and thyroid not enlarged, symmetric, no tenderness/mass/nodules Lungs: clear to auscultation bilaterally, normal percussion bilaterally and Nonlabored, good air movement Heart: regular rate and rhythm, S1, S2 normal, no murmur, click, rub or gallop and normal apical impulse Abdomen: soft, non-tender; bowel sounds normal; no masses,  no organomegaly Extremities: No C./C. with trace edema. Mild varicose veins noted with mild venous stasis. Pulses: 2+ and symmetric Neurologic: Mental status: Alert, oriented, thought content appropriate, affect: normal Cranial nerves: normal Motor: grossly normal HEENT: [place]/AT, EOMI, MMM, anicteric sclera  [id number] today: Yes Rate: 85 , Rhythm: NSR, normal ECG;  Recent Labs: None presently checked. Creatinine was 1.3 and potassium 3.5 in June -- relatively stable  ASSESSMENT / PLAN: Relatively stable  from a cardiac standpoint. Blood pressures are much improved, but not yet at goal. HTN (hypertension) Labile blood pressure, not related to renal artery stenosis. Her blood pressures are still been relatively elevated at home, I will increase her diltiazem to 90 mg twice a day. This is in addition to the HCTZ. Target systolic blood pressure range for her she 120-150 mmHg.  Varicose veins of both legs with edema She does have venous reflux mostly left leg. I don't think this is all related to the weakness she is having, but may have something to do with some of the burning and numbness. Not very much amenable to percutaneous interventions, would consider compression stockings for support hose.  Obesity (BMI 30-39.9) Again, hopefully she is able to get up move around more she lives away. She just is having a hard time burning calories with her limited mobility  Leg pain, bilateral My overall impression based on the findings it be done her evaluation is that this is probably musculoskeletal/lumbar spine related.  Cannot exclude rheumatologic disorder either.  Dyslipidemia She recently had a simvastatin stopped for cramping. She has not had a recheck of her lipids and LFTs since stopping the statin. We'll recheck lipids/LFTs to determine how close we are to control.    Orders Placed This Encounter  Procedures  . Lipid panel    Order Specific Question:  Has the patient fasted?    Answer:  Yes  . Comprehensive metabolic panel    Order Specific Question:  Has the patient fasted?    Answer:  Yes   Followup: Roughly 6 months  Sitlaly Gudiel W. Ellyn Hack, M.D., M.S. [facility] [address] [place], [place]  [address]  [phone] Pager # [phone]

## 2013-01-21 NOTE — Assessment & Plan Note (Signed)
My overall impression based on the findings it be done her evaluation is that this is probably musculoskeletal/lumbar spine related.  Cannot exclude rheumatologic disorder either.

## 2013-01-28 ENCOUNTER — Other Ambulatory Visit: Payer: Self-pay | Admitting: Emergency Medicine

## 2013-01-28 DIAGNOSIS — C50419 Malignant neoplasm of upper-outer quadrant of unspecified female breast: Secondary | ICD-10-CM

## 2013-01-29 ENCOUNTER — Telehealth: Payer: Self-pay | Admitting: *Deleted

## 2013-01-29 ENCOUNTER — Telehealth: Payer: Self-pay | Admitting: Cardiology

## 2013-01-29 DIAGNOSIS — I6523 Occlusion and stenosis of bilateral carotid arteries: Secondary | ICD-10-CM

## 2013-01-29 MED ORDER — DILTIAZEM HCL 60 MG PO TABS: 120.0000 mg | ORAL_TABLET | Freq: Two times a day (BID) | ORAL | Status: DC

## 2013-01-29 NOTE — Telephone Encounter (Signed)
Message copied by Raiford Simmonds on Fri Jan 29, 2013 12:20 PM ------      Message from: Leonie Man      Created: Sun Jan 10, 2013 10:33 AM       Continued mild-moderate stenosis. Still less than 50% reduction, but has increased somewhat in the right carotid.      Will followup in 12 months, then if stable can followup every 2 years            HARDING,DAVID W, MD       ------

## 2013-01-29 NOTE — Telephone Encounter (Signed)
Blood Pressure was up this morning when she first got up it was 190/87 and pulse rate was 80, took it 5 minutes ago and it was 193/85 and pulse rate was 80.She have taken both of her pills this morning.Please call and advise.

## 2013-01-29 NOTE — Telephone Encounter (Signed)
Returned call and informed pt per instructions by MD.  Pt advised to start w/ this evening's dose since BP was 135/66 before leaving the house.  Pt verbalized understanding and agreed w/ plan.  Pt will call back w/ any questions or concerns.

## 2013-01-29 NOTE — Addendum Note (Signed)
Addended by: Erasmo Downer R on: 01/29/2013 11:06 AM   Modules accepted: Orders

## 2013-01-29 NOTE — Telephone Encounter (Signed)
RESULTS GIVEN AT OFFICE APPT  01/2013   ORDERED CAROTID DOPPLER 2016

## 2013-01-29 NOTE — Telephone Encounter (Signed)
Returned call and pt verified x 2.  Pt stated BP was 190/87 and 193/85.  Stated it was 180/ last night.  Stated she took diltiazem and HCTZ between 8- 8:15 am.  RN asked pt to check BP now.  BP 172/82 HR 83.  Pt denied having CP, SOB, HA or dizziness today.  BP now 165/75 on recheck.  Pt informed BP is coming down and advised she not recheck for at least 2 hours so that it doesn't stay elevated r/t her constantly checking it.  Pt also informed Dr. Ellyn Hack will be notified for further instructions.  Pt verbalized understanding and agreed w/ plan.    Pt is taking diltiazem 1.5 tabs BID.  Stated it seems like it doesn't cover throughout the day or the night.  Message forwarded to Dr. Ellyn Hack.  Pt going to get her hair done between 11am -12pm and wants call on her cell, [phone].  Stated it is okay to leave a message on home phone w/ instructions and she will call back if needed.

## 2013-01-29 NOTE — Telephone Encounter (Signed)
Ok - so we need to just do 120 bid (2 tabs BID) diltiazem. We may end up needing an additional med.

## 2013-02-02 ENCOUNTER — Other Ambulatory Visit (HOSPITAL_BASED_OUTPATIENT_CLINIC_OR_DEPARTMENT_OTHER): Payer: Medicare Other

## 2013-02-02 ENCOUNTER — Encounter: Payer: Self-pay | Admitting: Oncology

## 2013-02-02 ENCOUNTER — Ambulatory Visit (HOSPITAL_BASED_OUTPATIENT_CLINIC_OR_DEPARTMENT_OTHER): Payer: Medicare Other | Admitting: Oncology

## 2013-02-02 VITALS — BP 152/75 | HR 72 | Temp 97.3°F | Resp 18 | Ht 63.0 in | Wt 183.4 lb

## 2013-02-02 DIAGNOSIS — C50419 Malignant neoplasm of upper-outer quadrant of unspecified female breast: Secondary | ICD-10-CM

## 2013-02-02 DIAGNOSIS — D696 Thrombocytopenia, unspecified: Secondary | ICD-10-CM

## 2013-02-02 DIAGNOSIS — Z170 Estrogen receptor positive status [ER+]: Secondary | ICD-10-CM

## 2013-02-02 DIAGNOSIS — I10 Essential (primary) hypertension: Secondary | ICD-10-CM

## 2013-02-02 DIAGNOSIS — C50411 Malignant neoplasm of upper-outer quadrant of right female breast: Secondary | ICD-10-CM

## 2013-02-02 LAB — CBC WITH DIFFERENTIAL/PLATELET
BASO%: 0.6 % (ref 0.0–2.0)
Basophils Absolute: 0 10*3/uL (ref 0.0–0.1)
EOS ABS: 0 10*3/uL (ref 0.0–0.5)
EOS%: 0.5 % (ref 0.0–7.0)
HCT: 40.7 % (ref 34.8–46.6)
HGB: 13.5 g/dL (ref 11.6–15.9)
LYMPH%: 22.7 % (ref 14.0–49.7)
MCH: 29.5 pg (ref 25.1–34.0)
MCHC: 33.2 g/dL (ref 31.5–36.0)
MCV: 88.7 fL (ref 79.5–101.0)
MONO#: 0.6 10*3/uL (ref 0.1–0.9)
MONO%: 9 % (ref 0.0–14.0)
NEUT%: 67.2 % (ref 38.4–76.8)
NEUTROS ABS: 4.4 10*3/uL (ref 1.5–6.5)
Platelets: 91 10*3/uL — ABNORMAL LOW (ref 145–400)
RBC: 4.59 10*6/uL (ref 3.70–5.45)
RDW: 12.8 % (ref 11.2–14.5)
WBC: 6.6 10*3/uL (ref 3.9–10.3)
lymph#: 1.5 10*3/uL (ref 0.9–3.3)

## 2013-02-02 LAB — COMPREHENSIVE METABOLIC PANEL (CC13)
ALBUMIN: 4.5 g/dL (ref 3.5–5.0)
ALK PHOS: 18 U/L — AB (ref 40–150)
ALT: 28 U/L (ref 0–55)
AST: 27 U/L (ref 5–34)
Anion Gap: 13 mEq/L — ABNORMAL HIGH (ref 3–11)
BUN: 18.3 mg/dL (ref 7.0–26.0)
CO2: 26 meq/L (ref 22–29)
Calcium: 10.1 mg/dL (ref 8.4–10.4)
Chloride: 102 mEq/L (ref 98–109)
Creatinine: 1.3 mg/dL — ABNORMAL HIGH (ref 0.6–1.1)
GLUCOSE: 113 mg/dL (ref 70–140)
Potassium: 3.8 mEq/L (ref 3.5–5.1)
SODIUM: 141 meq/L (ref 136–145)
TOTAL PROTEIN: 7.5 g/dL (ref 6.4–8.3)
Total Bilirubin: 0.63 mg/dL (ref 0.20–1.20)

## 2013-02-02 LAB — LIPID PANEL
Cholesterol: 227 mg/dL — ABNORMAL HIGH (ref 0–200)
HDL: 34 mg/dL — ABNORMAL LOW (ref >39)
LDL Cholesterol: 159 mg/dL — ABNORMAL HIGH (ref 0–99)
Total CHOL/HDL Ratio: 6.7 Ratio
Triglycerides: 169 mg/dL — ABNORMAL HIGH (ref <150)
VLDL: 34 mg/dL (ref 0–40)

## 2013-02-02 NOTE — Progress Notes (Signed)
OFFICE PROGRESS NOTE  CC  Florina Ou, MD [address] [place] [address] Dr. Thea Silversmith  Dr. Erroll Luna   DIAGNOSIS: 80 year old female with new diagnosis of stage I right invasive ductal carcinoma.   STAGE:  Cancer of upper-outer quadrant of female breast  Primary site: Breast (Right)  Staging method: AJCC 7th Edition  Clinical: Stage IA (T1a, N0, cM0)  Summary: Stage IA (T1a, N0, cM0)   PRIOR THERAPY: #1 80 y.o. female. With medical history significant for our do some hypertension and ostial process. Patient recently had a screening mammogram performed that revealed a 4 x 4 by 3 mm area of concern at the 11:00 position in the right breast. Patient went on to have a biopsy performed on 03/02/2002. The pathology revealed invasive mammary carcinoma with microcalcifications grade 1. Tumor was ER +100% PR +100% HER-2/neu negative with a proliferation marker Ki-67 71%. E-cadherin stain revealed the patient in fact had a ductal carcinoma phenotype. She was seen in the [facility] clinic for discussion of treatment options  #2 patient is status post right lumpectomy with sentinel lymph node biopsy performed on 03/18/2012. The final pathology revealed a 0.9 cm invasive cancer that was grade 1 with associated DCIS sentinel node was negative for metastatic disease. Tumor was ER positive PR positive HER-2/neu negative.   #3 we discussed adjuvant treatment options. I think she is a good candidate for antiestrogen therapy with Aromasin 25 mg daily. She is  on Fosamax 70 mg daily for bone health. She certainly will need to continue having mammograms as well as bone density scans. We discussed this. We discussed the risks and benefits of Aromasin therapy. She understands the rationale and is agreeable to starting this.  CURRENT THERAPY: Aromasin 25 mg daily starting 04/07/2012.  INTERVAL HISTORY: Diane Lucas 80 y.o. female returns for followup visit  for 6  months. She is tolerating it well. She is doing well without any problems. She denies any nausea vomiting fevers chills night sweats headaches no shortness of breath chest pains or palpitations. She has no fevers no pain or tenderness. She is from cytopenic but no evidence of bleeding. This is long-standing. She has also noticed that her blood pressure has been elevated periodically. She has been seen by her cardiologist was monitoring this and adjusting her medications. Remainder of the 10 point review of systems is negative.  MEDICAL HISTORY: Past Medical History  Diagnosis Date  . Hypertension     Labile; with recent increase in pressures; renal arterial Dopplers 11/2012 mild to moderate left renal artery stenosis, does not explain hypertension.  . Elevated cholesterol   . Arthritis   . GERD (gastroesophageal reflux disease)   . Thyroid condition   . Vertigo   . Platelets decreased   . Back pain   . Sleep apnea     STOP BANG SCORE 4  . Apnea, sleep     never tested for OSA  . Breast cancer     ALLERGIES:  is allergic to amlodipine; aspirin; benazepril; minocycline; and xarelto.  MEDICATIONS:  Current Outpatient Prescriptions  Medication Sig Dispense Refill  . acetaminophen (TYLENOL) 500 MG tablet Take 2 tablet twice a day      . alendronate (FOSAMAX) 70 MG tablet Take 70 mg by mouth every 7 (seven) days. Takes on Tuesdays.  Take with a full glass of water on an empty stomach.      [name] BIOTIN PO Take 1 tablet  by mouth daily.       [name] diltiazem (CARDIZEM) 60 MG tablet Take 2 tablets (120 mg total) by mouth 2 (two) times daily.  270 tablet  2  . exemestane (AROMASIN) 25 MG tablet Take by mouth daily after breakfast.       . hydrochlorothiazide (HYDRODIURIL) 25 MG tablet Take 25 mg by mouth daily before breakfast.      . levothyroxine (SYNTHROID, LEVOTHROID) 50 MCG tablet Take 50 mcg by mouth daily before breakfast.      . Multiple Vitamins-Minerals (CENTRUM SILVER PO) Take by mouth  daily.      [name] omeprazole (PRILOSEC) 20 MG capsule Take 20 mg by mouth daily with breakfast.       No current facility-administered medications for this visit.    SURGICAL HISTORY:  Past Surgical History  Procedure Laterality Date  . Appendectomy    . Vein ligation and stripping      X2  . Hernia repair      ING HERNIA REPAIR  . Hip fracture surgery    . Joint replacement      RT HIP  . Dilation and curettage of uterus    . Abdominal hysterectomy    . Back surgery    . Some hardware removed from right hip Right     some hardware removed   . Breast lumpectomy with needle localization and axillary sentinel lymph node bx Right 03/18/2012    Procedure: BREAST LUMPECTOMY WITH NEEDLE LOCALIZATION AND AXILLARY SENTINEL LYMPH NODE BX;  Surgeon: Joyice Faster. Cornett, MD;  Location: [facility];  Service: General;  Laterality: Right;  . Breast surgery    . Carotid doppler Bilateral 12/29/2012    Continued mild to moderate stenosis; less than 50%. Increased velocities in right carotid. Followup 1 year.  . Lower extremity venous duplex Bilateral 12/15/2012    No DVT. Tortuous superficial veins with - greater than 3 seconds of the others this is a note in the left accessory saphenous vein and no insufficiency in the right or left small saphenous veins. The greater saphenous vein show history of vein stripping.    REVIEW OF SYSTEMS:  Pertinent items are noted in HPI.   HEALTH MAINTENANCE:   PHYSICAL EXAMINATION: Blood pressure 152/75, pulse 72, temperature 97.3 F (36.3 C), temperature source Oral, resp. rate 18, height _0  (1.6 m), weight 183 lb 6.4 oz (83.19 kg). Body mass index is 32.5 kg/(m^2). ECOG PERFORMANCE STATUS: 1 - Symptomatic but completely ambulatory  Patient is awake alert in no acute distress well appearing female HEENT exam: EOMI PERRLA sclerae anicteric no conjunctival pallor oral mucosa is moist neck is supple lungs clear cardiovascular regular rate rhythm abdomen is soft  nontender no HSM extremities no edema neuro nonfocal right breast healing surgical scar no masses or nodularity.   LABORATORY DATA: Lab Results  Component Value Date   WBC 6.6 02/02/2013   HGB 13.5 02/02/2013   HCT 40.7 02/02/2013   MCV 88.7 02/02/2013   PLT 91* 02/02/2013      Chemistry      Component Value Date/Time   NA 141 02/02/2013 1103   NA 143 03/17/2012 1041   K 3.8 02/02/2013 1103   K 3.5 03/17/2012 1041   CL 103 07/08/2012 1257   CL 101 03/17/2012 1041   CO2 26 02/02/2013 1103   CO2 31 03/17/2012 1041   BUN 18.3 02/02/2013 1103   BUN 18 03/17/2012 1041   CREATININE 1.3* 02/02/2013 1103   CREATININE  1.00 03/17/2012 1041      Component Value Date/Time   CALCIUM 10.1 02/02/2013 1103   CALCIUM 10.2 03/17/2012 1041   ALKPHOS 18* 02/02/2013 1103   ALKPHOS 21* 03/17/2012 1041   AST 27 02/02/2013 1103   AST 26 03/17/2012 1041   ALT 28 02/02/2013 1103   ALT 19 03/17/2012 1041   BILITOT 0.63 02/02/2013 1103   BILITOT 0.4 03/17/2012 1041     ADDITIONAL INFORMATION: 1. CHROMOGENIC IN-SITU HYBRIDIZATION Interpretation HER-2/NEU BY CISH - NEGATIVE, NO AMPLIFICATION OF HER-2 DETECTED. THE RATIO OF HER-2: CEP 17 SIGNALS WAS 1.16 AND AVERAGE HER-2 COPY NUMBER WAS <4.0 SIGNALS/CELL. Aldona Bar MD Pathologist, Electronic Signature ( Signed 03/26/2012) FINAL DIAGNOSIS Diagnosis 1. Breast, lumpectomy, Right - INVASIVE GRADE I DUCTAL CARCINOMA, SPANNING 0.9 CM IN GREATEST DIMENSION. - ASSOCIATED INTERMEDIATE GRADE DUCTAL CARCINOMA IN SITU. - DEFINITIVE LYMPH/VASCULAR INVASION IS NOT IDENTIFIED. - MARGINS ARE NEGATIVE. - SEE ONCOLOGY TEMPLATE. 2. Lymph node, sentinel, biopsy, Right axilla - ONE BENIGN LYMPH NODE WITH NO TUMOR SEEN (0/1). - SEE COMMENT 3. Lymph node, sentinel, biopsy - ONE BENIGN LYMPH NODE WITH NO TUMOR SEEN (0/1). - SEE COMMENT 4. Lymph node, sentinel, biopsy 1 of 4 FINAL for Mazurek, Reda S ([id number]) Diagnosis(continued) - ONE BENIGN LYMPH NODE WITH NO TUMOR SEEN (0/1). -  SEE COMMENT 5. Lymph node, sentinel, biopsy - ONE BENIGN LYMPH NODE WITH NO TUMOR SEEN (0/1). - SEE COMMENT Microscopic Comment 1. BREAST, INVASIVE TUMOR, WITH LYMPH NODE SAMPLING Specimen, including laterality: Right partial breast with sentinel lymph node sampling. Procedure: Right breast lumpectomy with sentinel lymph node sampling. Grade: I . Tubule formation: 3. Nuclear pleomorphism: 1. Mitotic: 1, see comments below. Tumor size (gross measurement): 0.9 cm. Margins: Invasive, distance to closest margin: 0.6 cm (lateral margin). Lymphovascular invasion: No definitive lymph/vascular invasion identified. Ductal carcinoma in situ: Yes. Grade: Intermediate. Extensive intraductal component: No. Lobular neoplasia: No. Tumor focality: Unifocal. Treatment effect: Not applicable. Extent of tumor: Tumor confined to breast parenchyma. Lymph nodes: # examined: 4. Lymph nodes with metastasis: 0. Breast prognostic profile: Performed on previous case [id number] Estrogen receptor: 100%, positive. Progesterone receptor: 100%, positive. Her 2 neu: 1.25, no amplification. Ki-67: 71%. Non-neoplastic breast: Fibrocystic changes with fat necrosis. TNM: pT1b, pN0, MX. Comments: Although the Ki-67 % was relatively high on the previous biopsy, significant mitotic activity is not identified morphologically. An E-Cadherin immunohistochemical stain is performed on a representative section of tumor. It is positive, confirming the ductal nature of the carcinoma. A Her-2 Neu by CISH will be repeated and reported in an addendum. 2. -5. A cytokeratin AE1/AE3 immunohistochemical stain is performed on each sentinel lymph node biopsy (four stains total). The stain is negative  RADIOGRAPHIC STUDIES:  Nm Sentinel Node Inj-no Rpt (breast)  03/18/2012  CLINICAL DATA: breast cancer   Sulfur colloid was injected intradermally by the nuclear medicine  technologist for breast cancer sentinel node localization.      Mm Rt Plc Breast Loc Dev   1st Lesion  Inc Mammo Guide  03/18/2012  *RADIOLOGY REPORT*  Clinical Data:  Biopsy-proven right breast cancer, preoperative needle localization  NEEDLE LOCALIZATION WITH MAMMOGRAPHIC GUIDANCE AND SPECIMEN RADIOGRAPH  Comparison:  Previous exams.  Patient presents for needle localization prior to lumpectomy.  I met with the patient and we discussed the procedure of needle localization including benefits and alternatives. We discussed the high likelihood of a successful procedure. We discussed the risks of the procedure, including infection, bleeding, tissue injury, and further surgery. Informed,  written consent was given.  Using mammographic guidance, sterile technique, 2% lidocaine and a 7 cm modified Kopans needle, the mass and coil shaped marker are localized using a superior to inferior approach.  The films are marked for Dr. Brantley Stage.  Specimen radiograph was performed at day Surgery, and confirms the intact hook wire and clip are present in the tissue sample. The specimen is marked for pathology.  IMPRESSION:  Needle localization right breast.  No apparent complications.   Original Report Authenticated By: Conchita Paris, M.D.     ASSESSMENT: 80 year old female with  #1 stage I invasive ductal carcinoma status post lumpectomy sentinel node. Patient has no positive nodes. She is a good candidate for antiestrogen therapy with Aromasin 25 mg daily we discussed the rationale risks and benefits side effects. Prescription was sent to her pharmacy. She understands that we will treat her for 5 years. She will need vitamin D calcium bone density scans. 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  #2 I will see her back  in 6 months time for followup.   All questions were answered. The patient knows to call the clinic with any problems, questions or concerns. We can certainly see the patient much sooner if necessary.  I spent 15 minutes counseling the patient face to face. The total time spent in the appointment was 30 minutes.    Marcy Panning, MD Medical/Oncology [facility] [phone] (beeper) [phone] (Office)  02/02/2013, 11:58 AM

## 2013-02-03 ENCOUNTER — Telehealth: Payer: Self-pay | Admitting: Oncology

## 2013-02-03 NOTE — Telephone Encounter (Signed)
lmonvm for pt re appt for 7/23 and mailed schedule.

## 2013-02-16 ENCOUNTER — Ambulatory Visit
Admission: RE | Admit: 2013-02-16 | Discharge: 2013-02-16 | Disposition: A | Discharge status: Home / Self Care | Payer: Medicare Other | Source: Ambulatory Visit | Attending: Oncology | Admitting: Oncology

## 2013-02-16 DIAGNOSIS — Z853 Personal history of malignant neoplasm of breast: Secondary | ICD-10-CM

## 2013-03-01 ENCOUNTER — Ambulatory Visit: Payer: Medicare Other | Admitting: Internal Medicine

## 2013-04-02 ENCOUNTER — Ambulatory Visit (INDEPENDENT_AMBULATORY_CARE_PROVIDER_SITE_OTHER): Payer: Medicare Other | Admitting: Internal Medicine

## 2013-04-02 ENCOUNTER — Encounter: Payer: Self-pay | Admitting: Internal Medicine

## 2013-04-02 VITALS — BP 160/78 | HR 67 | Ht 63.0 in | Wt 177.0 lb

## 2013-04-02 DIAGNOSIS — I10 Essential (primary) hypertension: Secondary | ICD-10-CM

## 2013-04-02 DIAGNOSIS — E669 Obesity, unspecified: Secondary | ICD-10-CM

## 2013-04-02 DIAGNOSIS — I83893 Varicose veins of bilateral lower extremities with other complications: Secondary | ICD-10-CM

## 2013-04-02 MED ORDER — VALSARTAN-HYDROCHLOROTHIAZIDE 160-25 MG PO TABS: 1.0000 | ORAL_TABLET | Freq: Every day | ORAL | Status: DC

## 2013-04-02 NOTE — Patient Instructions (Addendum)
Your physician has recommended you make the following change in your medication... - STOP diltiazem - STOP hydrochlorothiazide - START valsartan-hct 160-25mg  ONCE DAILY  Your physician recommends that you schedule a follow-up appointment in 2-3 week with Diane Lucas (pharmacist) for a blood pressure check.  Please schedule this on a Monday, Wednesday, Friday  Please bring your BP cuff with you  Your physician wants you to follow-up in: 1 year with Diane Lucas. You will receive a reminder letter in the mail two months in advance. If you don't receive a letter, please call our office to schedule the follow-up appointment.

## 2013-04-02 NOTE — Progress Notes (Signed)
OFFICE NOTE  Chief Complaint:  Leg pain  Primary Care Physician: Florina Ou, MD  HPI:  Diane Lucas is a pleasant 80 year old female whose husband is a patient of mine. She was previously seen by Dr. Ellyn Hack and by convenience wants to be seen by me. She has a history of varicose veins and has undergone stripping and ligation in both legs. She continues to have problems with leg pain which was thought possibly due to back pain or more recently myositis. She does have a history of breast cancer and has not had recurrence. She was recently put on steroids and apparently cause her blood pressure to go very high. She successfully weaning off of those however blood pressure remains elevated. She was placed on diltiazem for blood pressure control and recently hydrochlorothiazide was added. She denies any chest pain or worsening shortness of breath with exertion. There is no coronary artery disease history.  PMHx:  Past Medical History  Diagnosis Date  . Hypertension     Labile; with recent increase in pressures; renal arterial Dopplers 11/2012 mild to moderate left renal artery stenosis, does not explain hypertension.  . Elevated cholesterol   . Arthritis   . GERD (gastroesophageal reflux disease)   . Thyroid condition   . Vertigo   . Platelets decreased   . Back pain   . Sleep apnea     STOP BANG SCORE 4  . Apnea, sleep     never tested for OSA  . Breast cancer     Past Surgical History  Procedure Laterality Date  . Appendectomy    . Vein ligation and stripping      X2  . Hernia repair      ING HERNIA REPAIR  . Hip fracture surgery    . Joint replacement      RT HIP  . Dilation and curettage of uterus    . Abdominal hysterectomy    . Back surgery    . Some hardware removed from right hip Right     some hardware removed   . Breast lumpectomy with needle localization and axillary sentinel lymph node bx Right 03/18/2012    Procedure: BREAST LUMPECTOMY WITH NEEDLE  LOCALIZATION AND AXILLARY SENTINEL LYMPH NODE BX;  Surgeon: Joyice Faster. Cornett, MD;  Location: [facility];  Service: General;  Laterality: Right;  . Breast surgery    . Carotid doppler Bilateral 12/29/2012    Continued mild to moderate stenosis; less than 50%. Increased velocities in right carotid. Followup 1 year.  . Lower extremity venous duplex Bilateral 12/15/2012    No DVT. Tortuous superficial veins with - greater than 3 seconds of the others this is a note in the left accessory saphenous vein and no insufficiency in the right or left small saphenous veins. The greater saphenous vein show history of vein stripping.    FAMHx:  Family History  Problem Relation Age of Onset  . Prostate cancer Brother   . Bladder Cancer Maternal Grandfather     SOCHx:   reports that she has never smoked. She has never used smokeless tobacco. She reports that she does not drink alcohol or use illicit drugs.  ALLERGIES:  Allergies  Allergen Reactions  . Amlodipine Swelling  . Aspirin Other (See Comments)    REACTION: causes bleeding  . Benazepril Swelling    Of tongue.  . Minocycline Swelling  . Xarelto [Rivaroxaban] Other (See Comments)    REACTION: causes nosebleeds    ROS: A comprehensive  review of systems was negative except for: Musculoskeletal: positive for muscle weakness and myalgias  HOME MEDS: Current Outpatient Prescriptions  Medication Sig Dispense Refill  . acetaminophen (TYLENOL) 500 MG tablet Take 2 tablet twice a day      . alendronate (FOSAMAX) 70 MG tablet Take 70 mg by mouth every 7 (seven) days. Takes on Tuesdays.  Take with a full glass of water on an empty stomach.      [name] BIOTIN PO Take 1 tablet by mouth daily.       [name] exemestane (AROMASIN) 25 MG tablet Take by mouth daily after breakfast.       . levothyroxine (SYNTHROID, LEVOTHROID) 50 MCG tablet Take 50 mcg by mouth daily before breakfast.      . Multiple Vitamins-Minerals (CENTRUM SILVER PO) Take by mouth daily.      [name]  omeprazole (PRILOSEC) 20 MG capsule Take 20 mg by mouth daily with breakfast.      . valsartan-hydrochlorothiazide (DIOVAN-HCT) 160-25 MG per tablet Take 1 tablet by mouth daily.  30 tablet  6   No current facility-administered medications for this visit.    LABS/IMAGING: No results found for this or any previous visit (from the past 48 hour(s)). No results found.  VITALS: BP 160/78  Pulse 67  Ht 5\' 3"  (1.6 m)  Wt 177 lb (80.287 kg)  BMI 31.36 kg/m2  EXAM: General appearance: alert and no distress Neck: no carotid bruit and no JVD Lungs: clear to auscultation bilaterally Heart: regular rate and rhythm, S1, S2 normal, no murmur, click, rub or gallop Abdomen: soft, non-tender; bowel sounds normal; no masses,  no organomegaly Extremities: edema trace and varicose veins noted Pulses: 2+ and symmetric Skin: Skin color, texture, turgor normal. No rashes or lesions Neurologic: Grossly normal Psych: Mood, affect normal  EKG: Normal sinus rhythm at 67  ASSESSMENT: 1. Hypertension-uncontrolled 2. Varicose veins-symptomatic 3. History of breast cancer  PLAN: 1.   Mrs. L[place]o could use better control of her blood pressure. Her most recent laboratory work did show very mildly elevated creatinine 1.3. This provides more evidence that she should benefit from an ACE inhibitor or ARB. It is not clear why she is on this medication. Diltiazem is not my first choice for therapy for hypertension. I would recommend discontinuing her diltiazem and HCTZ, and starting Diovan HCT 160/25 mg combination daily. Hopefully this monotherapy taking her blood pressure to goal. She may need an increase to 320/25 mg daily. I would like her record her blood pressures at home and bring a record of those at her cough to a followup appointment to see Erasmo Downer (our pharmacist/HTN specialist).  Follow-up with me in 6 months.  Pixie Casino, MD, [facility] Attending Cardiologist [facility]  HILTY,Kenneth  C 04/02/2013, 5:23 PM

## 2013-05-04 ENCOUNTER — Encounter: Payer: Self-pay | Admitting: Pharmacist Clinician (PhC)/ Clinical Pharmacy Specialist

## 2013-05-04 ENCOUNTER — Ambulatory Visit (INDEPENDENT_AMBULATORY_CARE_PROVIDER_SITE_OTHER): Payer: Medicare Other | Admitting: Pharmacist Clinician (PhC)/ Clinical Pharmacy Specialist

## 2013-05-04 VITALS — BP 128/64 | HR 84 | Ht 63.0 in | Wt 177.8 lb

## 2013-05-04 DIAGNOSIS — I10 Essential (primary) hypertension: Secondary | ICD-10-CM

## 2013-05-04 NOTE — Progress Notes (Signed)
05/04/2013 Diane Lucas 19-Sep-1930 [id number]   HPI:  Diane Lucas is a 80 y.o. female patient of Diane Lucas, with a PMH below who presents today for a blood pressure check.  Diane Lucas states that her blood pressure was well controlled until last summer, when she was given prednisone by her rheumatologist.  She states that she quickly gained 10 pounds as well.  Per patient dose was 5mg  daily for almost 3 months, then tapered to 2.5mg  daily for another 2 months before stopping at the end of November.  She also has a history of muscle weakness and pain, which she states is better, but still present, even after stopping simvastatin 6 moths ago.  She attends therapy twice weekly for back problems, in part due to being the primary caregiver for her 6'3" 200+ pound husband.  She was diagnosed with breast cancer and approximately 1 year ago was started on Aromasin for a 5 year course by Diane. Humphrey Lucas.  She brings in her home BP cuff today, with home readings that run mostly in the 130s/60s.  She states compliance with her medications and does not use tobacco or alcohol.  She does drink the occasional cup of coffee and admits to using salt on some foods.  She has a reported swelling of the tongue with benzzepril and lower extremity swelling with amlodipine.  She take 2gm of acetaminophen daily in divided doses for chronic back pain.   Current Outpatient Prescriptions  Medication Sig Dispense Refill  . acetaminophen (TYLENOL) 500 MG tablet Take 2 tablet twice a day      . alendronate (FOSAMAX) 70 MG tablet Take 70 mg by mouth every 7 (seven) days. Takes on Tuesdays.  Take with a full glass of water on an empty stomach.      [name] BIOTIN PO Take 1 tablet by mouth daily.       [name] exemestane (AROMASIN) 25 MG tablet Take by mouth daily after breakfast.       . levothyroxine (SYNTHROID, LEVOTHROID) 50 MCG tablet Take 50 mcg by mouth daily before breakfast.      . Multiple Vitamins-Minerals (CENTRUM SILVER PO) Take by  mouth daily.      [name] omeprazole (PRILOSEC) 20 MG capsule Take 20 mg by mouth daily with breakfast.      . valsartan-hydrochlorothiazide (DIOVAN-HCT) 160-25 MG per tablet Take 1 tablet by mouth daily.  30 tablet  6   No current facility-administered medications for this visit.    Allergies  Allergen Reactions  . Amlodipine Swelling  . Aspirin Other (See Comments)    REACTION: causes bleeding  . Benazepril Swelling    Of tongue.  . Minocycline Swelling  . Xarelto [Rivaroxaban] Other (See Comments)    REACTION: causes nosebleeds    Past Medical History  Diagnosis Date  . Hypertension     Labile; with recent increase in pressures; renal arterial Dopplers 11/2012 mild to moderate left renal artery stenosis, does not explain hypertension.  . Elevated cholesterol   . Arthritis   . GERD (gastroesophageal reflux disease)   . Thyroid condition   . Vertigo   . Platelets decreased   . Back pain   . Sleep apnea     STOP BANG SCORE 4  . Apnea, sleep     never tested for OSA  . Breast cancer     Blood pressure 128/64, pulse 84, height 5\' 3"  (1.6 m), weight 177 lb 12.8 oz (80.65 kg).  ASSESSMENT AND PLAN:  Today Diane Lucas BP is well within the normal limits for her age.  She has no complaints of dizziness or weakness, and states that arthralgia and back pain are her biggest problems.  I am not sure that the prednisone was the sole cause of hypertension in her, as Aromasin also reports hypertension as a side effect in up to 15% of patients.  This appears to be a class effect, and she needs to be on the medication for another 4 years.  She is currently taking valsartan hctz 160/25 and appears to be having no problems with this.  I did ask her to continue with home BP monitoring, as her cuff was accurate to within 5 points of our office reading today.  If she notices that her pressure is consistently lower than 120/60 I have asked that she call our office, as we may need to adjust her  medications.  Diane Lucas PharmD CPP [facility]

## 2013-05-04 NOTE — Patient Instructions (Signed)
Your blood pressure today is good at 128/64  Check your blood pressure at home a few times per week and keep record of the readings.  Take your BP meds as follows: valsartan/hctz 160/25mg  each day  Bring all of your meds, your BP cuff and your record of home blood pressures to your next appointment.  Exercise as you're able, try to walk approximately 30 minutes per day.  Keep salt intake to a minimum, especially watch canned and prepared boxed foods.  Eat more fresh fruits and vegetables and fewer canned items.  Avoid eating in fast food restaurants.    HOW TO TAKE YOUR BLOOD PRESSURE:   Rest 5 minutes before taking your blood pressure.    Don't smoke or drink caffeinated beverages for at least 30 minutes before.   Take your blood pressure before (not after) you eat.   Sit comfortably with your back supported and both feet on the floor (don't cross your legs).   Elevate your arm to heart level on a table or a desk.   Use the proper sized cuff. It should fit smoothly and snugly around your bare upper arm. There should be enough room to slip a fingertip under the cuff. The bottom edge of the cuff should be 1 inch above the crease of the elbow.   Ideally, take 3 measurements at one sitting and record the average.  If you notice your BP drop consistently below 120/60 please call our office.  Be aware that swelling of the tongue with benazepril can sometimes carry over to valsartan.

## 2013-06-08 ENCOUNTER — Ambulatory Visit (INDEPENDENT_AMBULATORY_CARE_PROVIDER_SITE_OTHER): Payer: Medicare Other | Admitting: Surgery

## 2013-06-21 ENCOUNTER — Other Ambulatory Visit: Payer: Self-pay

## 2013-06-21 DIAGNOSIS — C50419 Malignant neoplasm of upper-outer quadrant of unspecified female breast: Secondary | ICD-10-CM

## 2013-06-21 MED ORDER — EXEMESTANE 25 MG PO TABS: 25.0000 mg | ORAL_TABLET | Freq: Every day | ORAL | Status: DC

## 2013-06-21 NOTE — Progress Notes (Signed)
Refill authorization faxed to [facility].  Sent to scan.

## 2013-07-07 ENCOUNTER — Telehealth (INDEPENDENT_AMBULATORY_CARE_PROVIDER_SITE_OTHER): Payer: Self-pay

## 2013-07-07 NOTE — Telephone Encounter (Signed)
LMOM> Called to see if patient can reschedule her appt from Monday 6/29 to 7/1. Please r/s her if she can move to Wednesday.

## 2013-07-12 ENCOUNTER — Ambulatory Visit (INDEPENDENT_AMBULATORY_CARE_PROVIDER_SITE_OTHER): Payer: Medicare Other | Admitting: Surgery

## 2013-07-14 ENCOUNTER — Ambulatory Visit (INDEPENDENT_AMBULATORY_CARE_PROVIDER_SITE_OTHER): Payer: Medicare Other | Admitting: Surgery

## 2013-07-14 ENCOUNTER — Encounter (INDEPENDENT_AMBULATORY_CARE_PROVIDER_SITE_OTHER): Payer: Self-pay | Admitting: Surgery

## 2013-07-14 VITALS — BP 125/65 | HR 78 | Temp 97.7°F | Resp 16 | Ht 62.5 in | Wt 171.6 lb

## 2013-07-14 DIAGNOSIS — Z853 Personal history of malignant neoplasm of breast: Secondary | ICD-10-CM

## 2013-07-14 NOTE — Patient Instructions (Signed)
Return 1 year. 

## 2013-07-14 NOTE — Progress Notes (Signed)
NAME: Diane Lucas       DOB: Nov 13, 1930           DATE: 07/14/2013        MRN: [id number]  CC:   Chief Complaint  Patient presents with  . Breast Cancer Long Term Follow Up    PAULENE TAYAG is a 80 y.o.Marland Kitchenfemale   80 year old female with new diagnosis of stage I right invasive ductal carcinoma.  STAGE:  Cancer of upper-outer quadrant of female breast  Primary site: Breast (Right)  Staging method: AJCC 7th Edition  Clinical: Stage IA (T1a, N0, cM0)  Summary: Stage IA (T1a, N0, cM0)  PRIOR THERAPY:  #1 80 y.o. female. With medical history significant for our do some hypertension and ostial process. Patient recently had a screening mammogram performed that revealed a 4 x 4 by 3 mm area of concern at the 11:00 position in the right breast. Patient went on to have a biopsy performed on 03/02/2002. The pathology revealed invasive mammary carcinoma with microcalcifications grade 1. Tumor was ER +100% PR +100% HER-2/neu negative with a proliferation marker Ki-67 71%. E-cadherin stain revealed the patient in fact had a ductal carcinoma phenotype. She was seen in the [facility] clinic for discussion of treatment options  #2 patient is status post right lumpectomy with sentinel lymph node biopsy performed on 03/18/2012. The final pathology revealed a 0.9 cm invasive cancer that was grade 1 with associated DCIS sentinel node was negative for metastatic disease. Tumor was ER positive PR positive HER-2/neu negative.  Pt has issues with myalgias and blood pressure.  No breast issues. Walking with a cane.  Back pain a issue.    PFSH: She has had no significant changes since the last visit here.  ROS: There have been no significant changes since the last visit here  EXAM:  VS: BP 125/65  Pulse 78  Temp(Src) 97.7 F (36.5 C)  Resp 16  Ht 5' 2.5" (1.588 m)  Wt 171 lb 9.6 oz (77.837 kg)  BMI 30.87 kg/m2  General: The patient is alert, oriented, generally healthy appearing, NAD. Mood and affect are  normal.  Breasts:  right breast shows scar but no evidence of recurrent mass.  Left breast normal.  Lymphatics: She has no axillary or supraclavicular adenopathy on either side.  Extremities: Full ROM of the surgical side with no lymphedema noted.  Data Reviewed: CLINICAL DATA: Status post right lumpectomy for breast cancer in  2014.  EXAM:  DIGITAL DIAGNOSTIC BILATERAL MAMMOGRAM WITH CAD  COMPARISON: Previous examinations.  ACR Breast Density Category b: There are scattered areas of  fibroglandular density.  FINDINGS:  Interval post lumpectomy changes on the right. No findings elsewhere  in either breast suspicious for malignancy.  Mammographic images were processed with CAD.  IMPRESSION:  No evidence of malignancy.  RECOMMENDATION:  Bilateral diagnostic mammogram in 1 year.  I have discussed the findings and recommendations with the patient.  Results were also provided in writing at the conclusion of the  visit. If applicable, a reminder letter will be sent to the patient  regarding the next appointment.  BI-RADS CATEGORY 2: Benign finding(s).  Electronically Signed  By: Enrique Sack M.D.  On: 02/16/2013 11:55   Impression: Doing well, with no evidence of recurrent cancer or new cancer  Plan: Will continue to follow up in 12 months here.mammogram due January 2016. Continue Aromasin

## 2013-07-26 ENCOUNTER — Ambulatory Visit (HOSPITAL_BASED_OUTPATIENT_CLINIC_OR_DEPARTMENT_OTHER): Payer: Medicare Other | Admitting: Oncology

## 2013-07-26 ENCOUNTER — Encounter: Payer: Self-pay | Admitting: Oncology

## 2013-07-26 ENCOUNTER — Other Ambulatory Visit (HOSPITAL_BASED_OUTPATIENT_CLINIC_OR_DEPARTMENT_OTHER): Payer: Medicare Other

## 2013-07-26 ENCOUNTER — Telehealth: Payer: Self-pay | Admitting: Oncology

## 2013-07-26 ENCOUNTER — Other Ambulatory Visit: Payer: Medicare Other

## 2013-07-26 ENCOUNTER — Ambulatory Visit: Payer: Medicare Other | Admitting: Oncology

## 2013-07-26 VITALS — BP 129/62 | HR 75 | Temp 97.4°F | Resp 18 | Ht 62.5 in | Wt 171.0 lb

## 2013-07-26 DIAGNOSIS — C50419 Malignant neoplasm of upper-outer quadrant of unspecified female breast: Secondary | ICD-10-CM

## 2013-07-26 DIAGNOSIS — D696 Thrombocytopenia, unspecified: Secondary | ICD-10-CM

## 2013-07-26 DIAGNOSIS — I10 Essential (primary) hypertension: Secondary | ICD-10-CM

## 2013-07-26 DIAGNOSIS — Z170 Estrogen receptor positive status [ER+]: Secondary | ICD-10-CM

## 2013-07-26 LAB — CBC WITH DIFFERENTIAL/PLATELET
BASO%: 0.2 % (ref 0.0–2.0)
Basophils Absolute: 0 10e3/uL (ref 0.0–0.1)
EOS%: 0.2 % (ref 0.0–7.0)
Eosinophils Absolute: 0 10e3/uL (ref 0.0–0.5)
HCT: 39.1 % (ref 34.8–46.6)
HGB: 12.7 g/dL (ref 11.6–15.9)
LYMPH%: 12.5 % — ABNORMAL LOW (ref 14.0–49.7)
MCH: 30 pg (ref 25.1–34.0)
MCHC: 32.5 g/dL (ref 31.5–36.0)
MCV: 92.2 fL (ref 79.5–101.0)
MONO#: 0.6 10e3/uL (ref 0.1–0.9)
MONO%: 6.2 % (ref 0.0–14.0)
NEUT#: 8 10e3/uL — ABNORMAL HIGH (ref 1.5–6.5)
NEUT%: 80.9 % — ABNORMAL HIGH (ref 38.4–76.8)
Platelets: 80 10e3/uL — ABNORMAL LOW (ref 145–400)
RBC: 4.24 10e6/uL (ref 3.70–5.45)
RDW: 12.5 % (ref 11.2–14.5)
WBC: 9.9 10e3/uL (ref 3.9–10.3)
lymph#: 1.2 10e3/uL (ref 0.9–3.3)

## 2013-07-26 LAB — COMPREHENSIVE METABOLIC PANEL (CC13)
ALK PHOS: 20 U/L — AB (ref 40–150)
ALT: 18 U/L (ref 0–55)
AST: 18 U/L (ref 5–34)
Albumin: 4.3 g/dL (ref 3.5–5.0)
Anion Gap: 13 mEq/L — ABNORMAL HIGH (ref 3–11)
BILIRUBIN TOTAL: 0.45 mg/dL (ref 0.20–1.20)
BUN: 25.6 mg/dL (ref 7.0–26.0)
CO2: 26 mEq/L (ref 22–29)
Calcium: 10.4 mg/dL (ref 8.4–10.4)
Chloride: 102 mEq/L (ref 98–109)
Creatinine: 1.4 mg/dL — ABNORMAL HIGH (ref 0.6–1.1)
GLUCOSE: 97 mg/dL (ref 70–140)
Potassium: 4.4 mEq/L (ref 3.5–5.1)
Sodium: 140 mEq/L (ref 136–145)
Total Protein: 8.1 g/dL (ref 6.4–8.3)

## 2013-07-26 MED ORDER — ALENDRONATE SODIUM 70 MG PO TABS: 70.0000 mg | ORAL_TABLET | ORAL | Status: DC

## 2013-07-26 NOTE — Progress Notes (Signed)
OFFICE PROGRESS NOTE  CC  Diane Ou, MD [address] [place] [address] Dr. Thea Silversmith  Dr. Erroll Luna   DIAGNOSIS: 80 year old female with new diagnosis of stage I right invasive ductal carcinoma.   STAGE:  Cancer of upper-outer quadrant of female breast  Primary site: Breast (Right)  Staging method: AJCC 7th Edition  Clinical: Stage IA (T1a, N0, cM0)  Summary: Stage IA (T1a, N0, cM0)   PRIOR THERAPY: #1 80 y.o. female. With medical history significant for our do some hypertension and ostial process. Patient recently had a screening mammogram performed that revealed a 4 x 4 by 3 mm area of concern at the 11:00 position in the right breast. Patient went on to have a biopsy performed on 03/02/2002. The pathology revealed invasive mammary carcinoma with microcalcifications grade 1. Tumor was ER +100% PR +100% HER-2/neu negative with a proliferation marker Ki-67 71%. E-cadherin stain revealed the patient in fact had a ductal carcinoma phenotype. She was seen in the [facility] clinic for discussion of treatment options  #2 patient is status post right lumpectomy with sentinel lymph node biopsy performed on 03/18/2012. The final pathology revealed a 0.9 cm invasive cancer that was grade 1 with associated DCIS sentinel node was negative for metastatic disease. Tumor was ER positive PR positive HER-2/neu negative.   #3 we discussed adjuvant treatment options. I think she is a good candidate for antiestrogen therapy with Aromasin 25 mg daily. She is  on Fosamax 70 mg daily for bone health. She certainly will need to continue having mammograms as well as bone density scans. We discussed this. We discussed the risks and benefits of Aromasin therapy. She understands the rationale and is agreeable to starting this.  CURRENT THERAPY: Aromasin 25 mg daily starting 04/07/2012.  INTERVAL HISTORY: Diane Lucas 80 y.o. female returns for followup visit  for 6  months. She is tolerating Aromasin well. She is doing well without any problems. She denies any nausea vomiting fevers chills night sweats headaches no shortness of breath chest pains or palpitations. She has no fevers no pain or tenderness. She is thrombocytopenic but no evidence of bleeding. This is long-standing. She has also noticed that her blood pressure has been elevated periodically. She has been seen by her cardiologist was monitoring this and adjusting her medications. Remainder of the 10 point review of systems is negative.  MEDICAL HISTORY: Past Medical History  Diagnosis Date  . Hypertension     Labile; with recent increase in pressures; renal arterial Dopplers 11/2012 mild to moderate left renal artery stenosis, does not explain hypertension.  . Elevated cholesterol   . Arthritis   . GERD (gastroesophageal reflux disease)   . Thyroid condition   . Vertigo   . Platelets decreased   . Back pain   . Sleep apnea     STOP BANG SCORE 4  . Apnea, sleep     never tested for OSA  . Breast cancer     ALLERGIES:  is allergic to amlodipine; aspirin; benazepril; minocycline; and xarelto.  MEDICATIONS:  Current Outpatient Prescriptions  Medication Sig Dispense Refill  . Calcium Carbonate-Vitamin D (CALCIUM-VITAMIN D) 500-200 MG-UNIT per tablet Take 2 tablets by mouth daily.      . Cholecalciferol 1000 UNITS tablet Take 1,000 Units by mouth daily.      [name] acetaminophen (TYLENOL) 500 MG tablet Take 2 tablet twice a day      . alendronate (FOSAMAX) 70 MG tablet Take  1 tablet (70 mg total) by mouth once a week. Takes on Tuesdays.  Take with a full glass of water on an empty stomach.  12 tablet  3  . BIOTIN PO Take 1 tablet by mouth daily.       [name] exemestane (AROMASIN) 25 MG tablet Take 1 tablet (25 mg total) by mouth daily after breakfast.  30 tablet  2  . levothyroxine (SYNTHROID, LEVOTHROID) 50 MCG tablet Take 50 mcg by mouth daily before breakfast.      . Multiple Vitamins-Minerals  (CENTRUM SILVER PO) Take by mouth daily.      [name] omeprazole (PRILOSEC) 20 MG capsule Take 20 mg by mouth daily.       . valsartan-hydrochlorothiazide (DIOVAN-HCT) 160-25 MG per tablet Take 1 tablet by mouth daily.  30 tablet  6   No current facility-administered medications for this visit.    SURGICAL HISTORY:  Past Surgical History  Procedure Laterality Date  . Appendectomy    . Vein ligation and stripping      X2  . Hernia repair      ING HERNIA REPAIR  . Hip fracture surgery    . Joint replacement      RT HIP  . Dilation and curettage of uterus    . Abdominal hysterectomy    . Back surgery    . Some hardware removed from right hip Right     some hardware removed   . Breast lumpectomy with needle localization and axillary sentinel lymph node bx Right 03/18/2012    Procedure: BREAST LUMPECTOMY WITH NEEDLE LOCALIZATION AND AXILLARY SENTINEL LYMPH NODE BX;  Surgeon: Joyice Faster. Cornett, MD;  Location: [facility];  Service: General;  Laterality: Right;  . Breast surgery    . Carotid doppler Bilateral 12/29/2012    Continued mild to moderate stenosis; less than 50%. Increased velocities in right carotid. Followup 1 year.  . Lower extremity venous duplex Bilateral 12/15/2012    No DVT. Tortuous superficial veins with - greater than 3 seconds of the others this is a note in the left accessory saphenous vein and no insufficiency in the right or left small saphenous veins. The greater saphenous vein show history of vein stripping.    REVIEW OF SYSTEMS:  Pertinent items are noted in HPI.   HEALTH MAINTENANCE:   PHYSICAL EXAMINATION: Blood pressure 129/62, pulse 75, temperature 97.4 F (36.3 C), temperature source Oral, resp. rate 18, height 5' 2.5" (1.588 m), weight 171 lb (77.565 kg). Body mass index is 30.76 kg/(m^2). ECOG PERFORMANCE STATUS: 1 - Symptomatic but completely ambulatory  Patient is awake alert in no acute distress well appearing female HEENT exam: EOMI PERRLA sclerae  anicteric no conjunctival pallor oral mucosa is moist neck is supple lungs clear cardiovascular regular rate rhythm abdomen is soft nontender no HSM extremities no edema neuro nonfocal right breast healing surgical scar no masses or nodularity.   LABORATORY DATA: Lab Results  Component Value Date   WBC 9.9 07/26/2013   HGB 12.7 07/26/2013   HCT 39.1 07/26/2013   MCV 92.2 07/26/2013   PLT 80* 07/26/2013      Chemistry      Component Value Date/Time   NA 140 07/26/2013 1441   NA 143 03/17/2012 1041   K 4.4 07/26/2013 1441   K 3.5 03/17/2012 1041   CL 103 07/08/2012 1257   CL 101 03/17/2012 1041   CO2 26 07/26/2013 1441   CO2 31 03/17/2012 1041   BUN 25.6 07/26/2013 1441  BUN 18 03/17/2012 1041   CREATININE 1.4* 07/26/2013 1441   CREATININE 1.00 03/17/2012 1041      Component Value Date/Time   CALCIUM 10.4 07/26/2013 1441   CALCIUM 10.2 03/17/2012 1041   ALKPHOS 20* 07/26/2013 1441   ALKPHOS 21* 03/17/2012 1041   AST 18 07/26/2013 1441   AST 26 03/17/2012 1041   ALT 18 07/26/2013 1441   ALT 19 03/17/2012 1041   BILITOT 0.45 07/26/2013 1441   BILITOT 0.4 03/17/2012 1041     ADDITIONAL INFORMATION: 1. CHROMOGENIC IN-SITU HYBRIDIZATION Interpretation HER-2/NEU BY CISH - NEGATIVE, NO AMPLIFICATION OF HER-2 DETECTED. THE RATIO OF HER-2: CEP 17 SIGNALS WAS 1.16 AND AVERAGE HER-2 COPY NUMBER WAS <4.0 SIGNALS/CELL. Aldona Bar MD Pathologist, Electronic Signature ( Signed 03/26/2012) FINAL DIAGNOSIS Diagnosis 1. Breast, lumpectomy, Right - INVASIVE GRADE I DUCTAL CARCINOMA, SPANNING 0.9 CM IN GREATEST DIMENSION. - ASSOCIATED INTERMEDIATE GRADE DUCTAL CARCINOMA IN SITU. - DEFINITIVE LYMPH/VASCULAR INVASION IS NOT IDENTIFIED. - MARGINS ARE NEGATIVE. - SEE ONCOLOGY TEMPLATE. 2. Lymph node, sentinel, biopsy, Right axilla - ONE BENIGN LYMPH NODE WITH NO TUMOR SEEN (0/1). - SEE COMMENT 3. Lymph node, sentinel, biopsy - ONE BENIGN LYMPH NODE WITH NO TUMOR SEEN (0/1). - SEE COMMENT 4. Lymph node,  sentinel, biopsy 1 of 4 FINAL for Vaughn, Ariannie S ([id number]) Diagnosis(continued) - ONE BENIGN LYMPH NODE WITH NO TUMOR SEEN (0/1). - SEE COMMENT 5. Lymph node, sentinel, biopsy - ONE BENIGN LYMPH NODE WITH NO TUMOR SEEN (0/1). - SEE COMMENT Microscopic Comment 1. BREAST, INVASIVE TUMOR, WITH LYMPH NODE SAMPLING Specimen, including laterality: Right partial breast with sentinel lymph node sampling. Procedure: Right breast lumpectomy with sentinel lymph node sampling. Grade: I . Tubule formation: 3. Nuclear pleomorphism: 1. Mitotic: 1, see comments below. Tumor size (gross measurement): 0.9 cm. Margins: Invasive, distance to closest margin: 0.6 cm (lateral margin). Lymphovascular invasion: No definitive lymph/vascular invasion identified. Ductal carcinoma in situ: Yes. Grade: Intermediate. Extensive intraductal component: No. Lobular neoplasia: No. Tumor focality: Unifocal. Treatment effect: Not applicable. Extent of tumor: Tumor confined to breast parenchyma. Lymph nodes: # examined: 4. Lymph nodes with metastasis: 0. Breast prognostic profile: Performed on previous case [id number] Estrogen receptor: 100%, positive. Progesterone receptor: 100%, positive. Her 2 neu: 1.25, no amplification. Ki-67: 71%. Non-neoplastic breast: Fibrocystic changes with fat necrosis. TNM: pT1b, pN0, MX. Comments: Although the Ki-67 % was relatively high on the previous biopsy, significant mitotic activity is not identified morphologically. An E-Cadherin immunohistochemical stain is performed on a representative section of tumor. It is positive, confirming the ductal nature of the carcinoma. A Her-2 Neu by CISH will be repeated and reported in an addendum. 2. -5. A cytokeratin AE1/AE3 immunohistochemical stain is performed on each sentinel lymph node biopsy (four stains total). The stain is negative  RADIOGRAPHIC STUDIES:  Nm Sentinel Node Inj-no Rpt (breast)  03/18/2012  CLINICAL DATA:  breast cancer   Sulfur colloid was injected intradermally by the nuclear medicine  technologist for breast cancer sentinel node localization.     Mm Rt Plc Breast Loc Dev   1st Lesion  Inc Mammo Guide  03/18/2012  *RADIOLOGY REPORT*  Clinical Data:  Biopsy-proven right breast cancer, preoperative needle localization  NEEDLE LOCALIZATION WITH MAMMOGRAPHIC GUIDANCE AND SPECIMEN RADIOGRAPH  Comparison:  Previous exams.  Patient presents for needle localization prior to lumpectomy.  I met with the patient and we discussed the procedure of needle localization including benefits and alternatives. We discussed the high likelihood of a successful procedure. We discussed the  risks of the procedure, including infection, bleeding, tissue injury, and further surgery. Informed, written consent was given.  Using mammographic guidance, sterile technique, 2% lidocaine and a 7 cm modified Kopans needle, the mass and coil shaped marker are localized using a superior to inferior approach.  The films are marked for Dr. Brantley Stage.  Specimen radiograph was performed at day Surgery, and confirms the intact hook wire and clip are present in the tissue sample. The specimen is marked for pathology.  IMPRESSION:  Needle localization right breast.  No apparent complications.   Original Report Authenticated By: Conchita Paris, M.D.     ASSESSMENT: 80 year old female with  #1 stage I invasive ductal carcinoma status post lumpectomy sentinel node. Patient has no positive nodes. She is on Aromasin 25 mg daily and tolerating this well. She understands that we will treat her for 5 years. She is on vitamin D and calcium She is  on Fosamax. Last bon density was performed in April 2014.  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 Last mammogram was normal in 02/2013.  #2 Continue Fosamax, Vitamin D, and Calcium.  #3 I will see her back in 6 months time for followup.   All questions were answered. The patient knows to call the clinic with any problems, questions or concerns. We can certainly see the patient much sooner if necessary.  I spent 20 minutes counseling the patient face to face. The total time spent in the appointment was 30 minutes.    Mikey Bussing, DNP, AGPCNP-BC  07/26/2013, 4:36 PM

## 2013-07-26 NOTE — Telephone Encounter (Signed)
gv and printed appt sched and avs for pt for Jan 2016 °

## 2013-07-28 ENCOUNTER — Other Ambulatory Visit: Payer: Self-pay | Admitting: Internal Medicine

## 2013-07-28 NOTE — Telephone Encounter (Signed)
Rx was sent to pharmacy electronically. 

## 2013-08-05 ENCOUNTER — Other Ambulatory Visit: Payer: Medicare Other

## 2013-08-05 ENCOUNTER — Ambulatory Visit: Payer: Medicare Other | Admitting: Oncology

## 2013-09-17 DIAGNOSIS — K2100 Gastro-esophageal reflux disease with esophagitis, without bleeding: Secondary | ICD-10-CM | POA: Insufficient documentation

## 2013-09-17 DIAGNOSIS — E039 Hypothyroidism, unspecified: Secondary | ICD-10-CM | POA: Insufficient documentation

## 2013-10-25 ENCOUNTER — Other Ambulatory Visit: Payer: Self-pay | Admitting: Oncology

## 2013-10-25 DIAGNOSIS — C50419 Malignant neoplasm of upper-outer quadrant of unspecified female breast: Secondary | ICD-10-CM

## 2013-10-29 ENCOUNTER — Other Ambulatory Visit: Payer: Self-pay

## 2013-11-29 ENCOUNTER — Other Ambulatory Visit: Payer: Self-pay

## 2013-11-29 DIAGNOSIS — C50419 Malignant neoplasm of upper-outer quadrant of unspecified female breast: Secondary | ICD-10-CM

## 2013-11-29 MED ORDER — EXEMESTANE 25 MG PO TABS: ORAL_TABLET | ORAL | Status: DC

## 2013-11-29 NOTE — Telephone Encounter (Signed)
Per [facility], insurance will only pay for 90 day prescription.  Refill sent for 90 day supply.  Receipt confirmed by pharmacy.

## 2013-12-03 ENCOUNTER — Telehealth (HOSPITAL_COMMUNITY): Payer: Self-pay | Admitting: *Deleted

## 2013-12-06 ENCOUNTER — Telehealth (HOSPITAL_COMMUNITY): Payer: Self-pay | Admitting: *Deleted

## 2013-12-06 ENCOUNTER — Encounter (HOSPITAL_COMMUNITY): Payer: Self-pay | Admitting: *Deleted

## 2013-12-06 ENCOUNTER — Other Ambulatory Visit (HOSPITAL_COMMUNITY): Payer: Self-pay | Admitting: Internal Medicine

## 2013-12-06 DIAGNOSIS — I6529 Occlusion and stenosis of unspecified carotid artery: Secondary | ICD-10-CM

## 2013-12-06 DIAGNOSIS — I10 Essential (primary) hypertension: Secondary | ICD-10-CM

## 2013-12-06 NOTE — Telephone Encounter (Signed)
Patient states PCP started her on lipitor and patient told PCP she could not take it so she started on zetia (per patient's request) but cannot afford this now. She has 2 weeks of zetia left Her most recent labs from September from PCP are in [facility]  She used to be on simvastatin 40mg  - was stopped by rheumatologist r/t leg aches/pain - she couldn't walk or lie in bed >> she now thinks this was more MKS related.   Will defer to Dr. Debara Pickett to advise on cholesterol management.

## 2013-12-06 NOTE — Telephone Encounter (Signed)
Pt was taken off of her cholesterol medication by Dr. Debara Pickett and Cyril Mourning however her family doctor had called in Lipitor for her last year but she didn't want to take it. She is now on Zetia but will be out in about 2 weeks and does not know if she should refill it or not. She feels like she can just stay off of it since Dr. Debara Pickett took her off of it to begin with. She has blood work due for January.  Please advise

## 2013-12-06 NOTE — Telephone Encounter (Signed)
No answer

## 2013-12-07 NOTE — Telephone Encounter (Signed)
When I talked with patient - she seemed to think trying simvastatin again would be OK, since her leg issue was ruled to be MSK related.   Proceed with this? Dose? QD?

## 2013-12-07 NOTE — Telephone Encounter (Signed)
Informed patient that Dr. Debara Pickett recommended simvastatin 40mg  QD. She will inform PCP, who follows up on her lipids, of this recommendation and to obtain an Rx. Medication added to med list.

## 2013-12-07 NOTE — Telephone Encounter (Signed)
I would recommend cholesterol medicine - what does she want to do? Take atorvastatin or simvastatin, another statin if Zetia is too expensive? If she is intolerant to both statins, then we may need to consider a different medication - maybe pravastatin 80 mg?  Dr. Lemmie Evens

## 2013-12-07 NOTE — Telephone Encounter (Signed)
Ok .. 40 mg daily.  H

## 2014-01-10 ENCOUNTER — Ambulatory Visit (HOSPITAL_COMMUNITY)
Admission: RE | Admit: 2014-01-10 | Discharge: 2014-01-10 | Disposition: A | Discharge status: Home / Self Care | Payer: Medicare Other | Source: Ambulatory Visit | Attending: Internal Medicine | Admitting: Internal Medicine

## 2014-01-10 ENCOUNTER — Telehealth: Payer: Self-pay | Admitting: Hematology and Oncology

## 2014-01-10 ENCOUNTER — Ambulatory Visit (HOSPITAL_BASED_OUTPATIENT_CLINIC_OR_DEPARTMENT_OTHER)
Admission: RE | Admit: 2014-01-10 | Discharge: 2014-01-10 | Disposition: A | Discharge status: Home / Self Care | Payer: Medicare Other | Source: Ambulatory Visit | Attending: Cardiovascular Disease | Admitting: Cardiovascular Disease

## 2014-01-10 DIAGNOSIS — I10 Essential (primary) hypertension: Secondary | ICD-10-CM | POA: Diagnosis not present

## 2014-01-10 DIAGNOSIS — I6529 Occlusion and stenosis of unspecified carotid artery: Secondary | ICD-10-CM

## 2014-01-10 DIAGNOSIS — I779 Disorder of arteries and arterioles, unspecified: Secondary | ICD-10-CM

## 2014-01-10 NOTE — Telephone Encounter (Signed)
Pt r/s appt from 01/24/14 to 01/11/14. Pt couldnt do the 01/24/14.

## 2014-01-10 NOTE — Progress Notes (Signed)
Renal Duplex Completed. Braelee Herrle, BS, RDMS, RVT  

## 2014-01-10 NOTE — Progress Notes (Signed)
Carotid Duplex Completed. Maeleigh Buschman, BS, RDMS, RVT  

## 2014-01-11 ENCOUNTER — Ambulatory Visit (HOSPITAL_BASED_OUTPATIENT_CLINIC_OR_DEPARTMENT_OTHER): Payer: Medicare Other | Admitting: Hematology and Oncology

## 2014-01-11 ENCOUNTER — Other Ambulatory Visit (HOSPITAL_BASED_OUTPATIENT_CLINIC_OR_DEPARTMENT_OTHER): Payer: Medicare Other

## 2014-01-11 ENCOUNTER — Telehealth: Payer: Self-pay | Admitting: Hematology and Oncology

## 2014-01-11 VITALS — BP 115/56 | HR 68 | Temp 97.9°F | Resp 18 | Ht 62.5 in | Wt 172.1 lb

## 2014-01-11 DIAGNOSIS — Z170 Estrogen receptor positive status [ER+]: Secondary | ICD-10-CM

## 2014-01-11 DIAGNOSIS — D696 Thrombocytopenia, unspecified: Secondary | ICD-10-CM

## 2014-01-11 DIAGNOSIS — M898X9 Other specified disorders of bone, unspecified site: Secondary | ICD-10-CM

## 2014-01-11 DIAGNOSIS — C50419 Malignant neoplasm of upper-outer quadrant of unspecified female breast: Secondary | ICD-10-CM

## 2014-01-11 DIAGNOSIS — C50411 Malignant neoplasm of upper-outer quadrant of right female breast: Secondary | ICD-10-CM

## 2014-01-11 DIAGNOSIS — M791 Myalgia: Secondary | ICD-10-CM

## 2014-01-11 LAB — COMPREHENSIVE METABOLIC PANEL (CC13)
ALK PHOS: 19 U/L — AB (ref 40–150)
ALT: 21 U/L (ref 0–55)
ANION GAP: 11 meq/L (ref 3–11)
AST: 24 U/L (ref 5–34)
Albumin: 4.5 g/dL (ref 3.5–5.0)
BILIRUBIN TOTAL: 0.51 mg/dL (ref 0.20–1.20)
BUN: 30 mg/dL — ABNORMAL HIGH (ref 7.0–26.0)
CALCIUM: 9.8 mg/dL (ref 8.4–10.4)
CO2: 29 mEq/L (ref 22–29)
Chloride: 103 mEq/L (ref 98–109)
Creatinine: 1.4 mg/dL — ABNORMAL HIGH (ref 0.6–1.1)
EGFR: 35 mL/min/{1.73_m2} — AB (ref >90)
Glucose: 129 mg/dl (ref 70–140)
Potassium: 3.8 mEq/L (ref 3.5–5.1)
SODIUM: 142 meq/L (ref 136–145)
Total Protein: 7.4 g/dL (ref 6.4–8.3)

## 2014-01-11 LAB — CBC WITH DIFFERENTIAL/PLATELET
BASO%: 0.7 % (ref 0.0–2.0)
BASOS ABS: 0.1 10*3/uL (ref 0.0–0.1)
EOS ABS: 0.1 10*3/uL (ref 0.0–0.5)
EOS%: 1 % (ref 0.0–7.0)
HCT: 37.7 % (ref 34.8–46.6)
HEMOGLOBIN: 12.2 g/dL (ref 11.6–15.9)
LYMPH%: 23.8 % (ref 14.0–49.7)
MCH: 30 pg (ref 25.1–34.0)
MCHC: 32.4 g/dL (ref 31.5–36.0)
MCV: 92.6 fL (ref 79.5–101.0)
MONO#: 0.7 10*3/uL (ref 0.1–0.9)
MONO%: 7.5 % (ref 0.0–14.0)
NEUT%: 67 % (ref 38.4–76.8)
NEUTROS ABS: 5.9 10*3/uL (ref 1.5–6.5)
Platelets: 84 10*3/uL — ABNORMAL LOW (ref 145–400)
RBC: 4.08 10*6/uL (ref 3.70–5.45)
RDW: 12.5 % (ref 11.2–14.5)
WBC: 8.8 10*3/uL (ref 3.9–10.3)
lymph#: 2.1 10*3/uL (ref 0.9–3.3)

## 2014-01-11 MED ORDER — TAMOXIFEN CITRATE 20 MG PO TABS: 20.0000 mg | ORAL_TABLET | Freq: Every day | ORAL | Status: DC

## 2014-01-11 NOTE — Assessment & Plan Note (Addendum)
Right breast invasive ductal carcinoma T1b N0 M0 stage IA ER/PR positive HER-2 negative, status post lumpectomy and is currently on oral antiestrogen therapy with Aromasin 25 mg daily  Aromasin toxicities: 1 severe myalgias 2. Diffuse muscle and bone pain (previously seen by orthopedics and spine surgeons, she has in fact undergone spine surgery, has bursae inflammations) She is also her primary caregiver for her husband who is disabled . We will switch her from Aromasin to tamoxifen 20 mg daily.   Rest cancer surveillance: 1. Breast exam 01/11/2014 is normal 2. Mammogram 02/16/2013 is normal Return to clinic in 3 months to evaluate of tamoxifen is better tolerated than Aromasin

## 2014-01-11 NOTE — Telephone Encounter (Signed)
per pof to sch pt appt-gave pt copy of sch °

## 2014-01-11 NOTE — Assessment & Plan Note (Signed)
Chronic thrombocytopenia platelets stable around 80,000: I agree with Dr. 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 I discussed with the patient that if she develops excessive bruising or bleeding symptoms that she needs to call our office.

## 2014-01-11 NOTE — Progress Notes (Signed)
Patient Care Team: Florina Ou, MD as PCP - General (Family Medicine)  DIAGNOSIS: Primary cancer of upper outer quadrant of right female breast   Staging form: Breast, AJCC 7th Edition     Clinical: Stage IA (T1a, N0, cM0) - Unsigned       Staging comments: Staged at breast conference 2.26.14      Pathologic: No stage assigned - Unsigned   SUMMARY OF ONCOLOGIC HISTORY:   Primary cancer of upper outer quadrant of right female breast   03/02/2012 Initial Diagnosis Right breast biopsy: Invasive mammary cancer grade 1 ER 100%, PR positive, HER-2 negative, Ki-67 71% ductal type   03/18/2012 Surgery Right lumpectomy: 0.9 cm invasive ductal carcinoma grade 1 DCIS, ER/PR positive HER-2 negative   04/07/2012 -  Anti-estrogen oral therapy Aromasin 25 mg daily changed to tamoxifen 20 mg daily from 01/11/2014 for myalgias    CHIEF COMPLIANT: Follow-up of breast cancer complains of diffuse muscle and bone pain  INTERVAL HISTORY: Diane Lucas is a 80 year old lady with above-mentioned history of stage I right breast cancer who is currently on hormone therapy with Aromasin. After she started Aromasin she had profound muscle aches and pains continued to get worse over time. She has seen orthopedics, spine surgeons will try different things but have had only moderate benefit. She is unable to sleep well at night because of this diffuse body aches and pains.  REVIEW OF SYSTEMS:   Constitutional: Denies fevers, chills or abnormal weight loss Eyes: Denies blurriness of vision Ears, nose, mouth, throat, and face: Denies mucositis or sore throat Respiratory: Denies cough, dyspnea or wheezes Cardiovascular: Denies palpitation, chest discomfort or lower extremity swelling Gastrointestinal:  Denies nausea, heartburn or change in bowel habits Skin: Denies abnormal skin rashes Lymphatics: Denies new lymphadenopathy or easy bruising Neurological:Denies numbness, tingling or new weaknesses Behavioral/Psych: Mood is  stable, no new changes  Breast:  denies any pain or lumps or nodules in either breasts All other systems were reviewed with the patient and are negative.  I have reviewed the past medical history, past surgical history, social history and family history with the patient and they are unchanged from previous note.  ALLERGIES:  is allergic to amlodipine; aspirin; benazepril; minocycline; and xarelto.  MEDICATIONS:  Current Outpatient Prescriptions  Medication Sig Dispense Refill  . acetaminophen (TYLENOL) 500 MG tablet Take 2 tablet twice a day    . alendronate (FOSAMAX) 70 MG tablet Take 1 tablet (70 mg total) by mouth once a week. Takes on Tuesdays.  Take with a full glass of water on an empty stomach. 12 tablet 3  . BIOTIN PO Take 1 tablet by mouth daily.     . Calcium Carbonate-Vitamin D (CALCIUM-VITAMIN D) 500-200 MG-UNIT per tablet Take 2 tablets by mouth daily.    . Cholecalciferol 1000 UNITS tablet Take 1,000 Units by mouth daily.    [name] levothyroxine (SYNTHROID, LEVOTHROID) 50 MCG tablet Take 50 mcg by mouth daily before breakfast.    . Multiple Vitamins-Minerals (CENTRUM SILVER PO) Take by mouth daily.    [name] omeprazole (PRILOSEC) 20 MG capsule Take 20 mg by mouth daily.     . simvastatin (ZOCOR) 10 MG tablet Take 10 mg by mouth at bedtime.  1  . valsartan-hydrochlorothiazide (DIOVAN-HCT) 160-25 MG per tablet TAKE ONE TABLET BY MOUTH DAILY 90 tablet 2  . tamoxifen (NOLVADEX) 20 MG tablet Take 1 tablet (20 mg total) by mouth daily. 90 tablet 3   No current facility-administered medications for this visit.  PHYSICAL EXAMINATION: ECOG PERFORMANCE STATUS: 1 - Symptomatic but completely ambulatory  Filed Vitals:   01/11/14 0929  BP: 115/56  Pulse: 68  Temp: 97.9 F (36.6 C)  Resp: 18   Filed Weights   01/11/14 0929  Weight: 172 lb 1.6 oz (78.064 kg)    GENERAL:alert, no distress and comfortable SKIN: skin color, texture, turgor are normal, no rashes or significant  lesions EYES: normal, Conjunctiva are pink and non-injected, sclera clear OROPHARYNX:no exudate, no erythema and lips, buccal mucosa, and tongue normal  NECK: supple, thyroid normal size, non-tender, without nodularity LYMPH:  no palpable lymphadenopathy in the cervical, axillary or inguinal LUNGS: clear to auscultation and percussion with normal breathing effort HEART: regular rate & rhythm and no murmurs and no lower extremity edema ABDOMEN:abdomen soft, non-tender and normal bowel sounds Musculoskeletal: Diffuse myalgias and joint pains  NEURO: alert & oriented x 3 with fluent speech, no focal motor/sensory deficits  LABORATORY DATA:  I have reviewed the data as listed   Chemistry      Component Value Date/Time   NA 142 01/11/2014 0853   NA 143 03/17/2012 1041   K 3.8 01/11/2014 0853   K 3.5 03/17/2012 1041   CL 103 07/08/2012 1257   CL 101 03/17/2012 1041   CO2 29 01/11/2014 0853   CO2 31 03/17/2012 1041   BUN 30.0* 01/11/2014 0853   BUN 18 03/17/2012 1041   CREATININE 1.4* 01/11/2014 0853   CREATININE 1.00 03/17/2012 1041      Component Value Date/Time   CALCIUM 9.8 01/11/2014 0853   CALCIUM 10.2 03/17/2012 1041   ALKPHOS 19* 01/11/2014 0853   ALKPHOS 21* 03/17/2012 1041   AST 24 01/11/2014 0853   AST 26 03/17/2012 1041   ALT 21 01/11/2014 0853   ALT 19 03/17/2012 1041   BILITOT 0.51 01/11/2014 0853   BILITOT 0.4 03/17/2012 1041       Lab Results  Component Value Date   WBC 8.8 01/11/2014   HGB 12.2 01/11/2014   HCT 37.7 01/11/2014   MCV 92.6 01/11/2014   PLT 84* 01/11/2014   NEUTROABS 5.9 01/11/2014     RADIOGRAPHIC STUDIES: I have personally reviewed the radiology reports and agreed with their findings. Mammogram February 2015 normal  ASSESSMENT & PLAN:  Primary cancer of upper outer quadrant of right female breast Right breast invasive ductal carcinoma T1b N0 M0 stage IA ER/PR positive HER-2 negative, status post lumpectomy and is currently on oral  antiestrogen therapy with Aromasin 25 mg daily  Aromasin toxicities: 1 severe myalgias 2. Diffuse muscle and bone pain (previously seen by orthopedics and spine surgeons, she has in fact undergone spine surgery, has bursae inflammations) She is also her primary caregiver for her husband who is disabled . We will switch her from Aromasin to tamoxifen 20 mg daily.   Rest cancer surveillance: 1. Breast exam 01/11/2014 is normal 2. Mammogram 02/16/2013 is normal Return to clinic in 3 months to evaluate of tamoxifen is better tolerated than Aromasin   Thrombocytopenia Chronic thrombocytopenia platelets stable around 80,000: I agree with Dr. 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 I discussed with the patient that if she develops excessive bruising or bleeding symptoms that she needs to call our office.   No orders of the defined types were placed in this encounter.   The patient has a good understanding of the overall plan. she agrees with it. She will call  with any problems that may develop before her next visit here.   Rulon Eisenmenger, MD 01/11/2014 10:16 AM

## 2014-01-20 ENCOUNTER — Other Ambulatory Visit: Payer: Self-pay | Admitting: Hematology and Oncology

## 2014-01-20 DIAGNOSIS — Z853 Personal history of malignant neoplasm of breast: Secondary | ICD-10-CM

## 2014-01-20 DIAGNOSIS — Z9889 Other specified postprocedural states: Secondary | ICD-10-CM

## 2014-01-24 ENCOUNTER — Ambulatory Visit: Payer: Medicare Other | Admitting: Hematology and Oncology

## 2014-01-24 ENCOUNTER — Other Ambulatory Visit: Payer: Medicare Other

## 2014-01-25 ENCOUNTER — Other Ambulatory Visit: Payer: Self-pay | Admitting: *Deleted

## 2014-01-25 DIAGNOSIS — I672 Cerebral atherosclerosis: Secondary | ICD-10-CM

## 2014-01-28 ENCOUNTER — Other Ambulatory Visit: Payer: Self-pay | Admitting: Internal Medicine

## 2014-02-17 ENCOUNTER — Ambulatory Visit
Admission: RE | Admit: 2014-02-17 | Discharge: 2014-02-17 | Disposition: A | Discharge status: Home / Self Care | Payer: Medicare Other | Source: Ambulatory Visit | Attending: Hematology and Oncology | Admitting: Hematology and Oncology

## 2014-02-17 DIAGNOSIS — Z9889 Other specified postprocedural states: Secondary | ICD-10-CM

## 2014-02-17 DIAGNOSIS — Z853 Personal history of malignant neoplasm of breast: Secondary | ICD-10-CM

## 2014-04-19 ENCOUNTER — Other Ambulatory Visit: Payer: Self-pay | Admitting: *Deleted

## 2014-04-19 ENCOUNTER — Telehealth: Payer: Self-pay | Admitting: *Deleted

## 2014-04-19 ENCOUNTER — Telehealth: Payer: Self-pay | Admitting: Hematology and Oncology

## 2014-04-19 NOTE — Telephone Encounter (Signed)
Canceled appointment 04/20/14. Wants to reschedule for 05/19/14. Sent POF to scheduler.

## 2014-04-19 NOTE — Telephone Encounter (Signed)
Called patient and she is aware of her new appointment

## 2014-04-20 ENCOUNTER — Ambulatory Visit: Payer: Medicare Other | Admitting: Hematology and Oncology

## 2014-04-27 ENCOUNTER — Telehealth: Payer: Self-pay | Admitting: Hematology and Oncology

## 2014-04-27 NOTE — Telephone Encounter (Signed)
Patients daughter called in to reschedule her appointment

## 2014-05-03 ENCOUNTER — Telehealth: Payer: Self-pay | Admitting: Hematology and Oncology

## 2014-05-03 NOTE — Telephone Encounter (Signed)
Patient called in to cancel 4/21 appointment as her husband is very ill and she cannot do this right now and will call back to reschedule

## 2014-05-04 ENCOUNTER — Telehealth: Payer: Self-pay

## 2014-05-04 NOTE — Telephone Encounter (Signed)
orderfor bone density faxed to [facility].  Sent to scan.

## 2014-05-05 ENCOUNTER — Ambulatory Visit: Payer: Medicare Other | Admitting: Hematology and Oncology

## 2014-05-17 ENCOUNTER — Other Ambulatory Visit: Payer: Self-pay | Admitting: Hematology and Oncology

## 2014-05-20 ENCOUNTER — Ambulatory Visit: Payer: Medicare Other | Admitting: Hematology and Oncology

## 2014-07-10 NOTE — Assessment & Plan Note (Signed)
Right breast invasive ductal carcinoma T1b N0 M0 stage IA ER/PR positive HER-2 negative, status post lumpectomy and is currently on oral antiestrogen therapy with Aromasin 25 mg daily  Aromasin toxicities: 1 severe myalgias 2. Diffuse muscle and bone pain (previously seen by orthopedics and spine surgeons, she has in fact undergone spine surgery, has bursae inflammations) She is also her primary caregiver for her husband who is disabled . We will switch her from Aromasin to tamoxifen 20 mg daily.  Shes tolerating Aromasin better than Tamoxifen  Breast cancer surveillance: 1. Breast exam 01/11/2014 is normal 2. 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 I discussed with the patient that if she develops excessive bruising or bleeding symptoms that she needs to call our office.   RTC in 6 months

## 2014-07-11 ENCOUNTER — Encounter: Payer: Self-pay | Admitting: Hematology and Oncology

## 2014-07-11 ENCOUNTER — Telehealth: Payer: Self-pay | Admitting: Hematology and Oncology

## 2014-07-11 ENCOUNTER — Other Ambulatory Visit: Payer: Self-pay

## 2014-07-11 ENCOUNTER — Ambulatory Visit (HOSPITAL_BASED_OUTPATIENT_CLINIC_OR_DEPARTMENT_OTHER): Payer: Medicare Other | Admitting: Hematology and Oncology

## 2014-07-11 VITALS — BP 148/49 | HR 66 | Temp 97.7°F | Resp 18 | Ht 62.5 in | Wt 171.6 lb

## 2014-07-11 DIAGNOSIS — Z170 Estrogen receptor positive status [ER+]: Secondary | ICD-10-CM

## 2014-07-11 DIAGNOSIS — D696 Thrombocytopenia, unspecified: Secondary | ICD-10-CM | POA: Diagnosis not present

## 2014-07-11 DIAGNOSIS — Z79810 Long term (current) use of selective estrogen receptor modulators (SERMs): Secondary | ICD-10-CM | POA: Diagnosis not present

## 2014-07-11 DIAGNOSIS — C50411 Malignant neoplasm of upper-outer quadrant of right female breast: Secondary | ICD-10-CM | POA: Diagnosis not present

## 2014-07-11 NOTE — Progress Notes (Signed)
Patient Care Team: Florina Ou, MD as PCP - General (Family Medicine)  DIAGNOSIS: Primary cancer of upper outer quadrant of right female breast   Staging form: Breast, AJCC 7th Edition     Clinical: Stage IA (T1a, N0, cM0) - Unsigned       Staging comments: Staged at breast conference 2.26.14      Pathologic: No stage assigned - Unsigned   SUMMARY OF ONCOLOGIC HISTORY:   Primary cancer of upper outer quadrant of right female breast   03/02/2012 Initial Diagnosis Right breast biopsy: Invasive mammary cancer grade 1 ER 100%, PR positive, HER-2 negative, Ki-67 71% ductal type   03/18/2012 Surgery Right lumpectomy: 0.9 cm invasive ductal carcinoma grade 1 DCIS, ER/PR positive HER-2 negative   04/07/2012 -  Anti-estrogen oral therapy Aromasin 25 mg daily changed to tamoxifen 20 mg daily from 01/11/2014 for myalgias    CHIEF COMPLIANT: Follow-up on tamoxifen  INTERVAL HISTORY: Diane Lucas is a 81 year old with above-mentioned history right-sided breast cancer currently on tamoxifen. We switched her from Aromasin to tamoxifen because of myalgias. She is doing so much more better on tamoxifen. She does not have as much pain or discomfort in her back or hips. Her husband was hospitalized for urinary infections and she has been dealing with that. She did not schedule her bone density test. Her mammograms in February were normal. She denies any lumps or nodules breasts.  REVIEW OF SYSTEMS:   Constitutional: Denies fevers, chills or abnormal weight loss Eyes: Denies blurriness of vision Ears, nose, mouth, throat, and face: Denies mucositis or sore throat Respiratory: Denies cough, dyspnea or wheezes Cardiovascular: Denies palpitation, chest discomfort or lower extremity swelling Gastrointestinal:  Denies nausea, heartburn or change in bowel habits Skin: Denies abnormal skin rashes Lymphatics: Denies new lymphadenopathy or easy bruising Neurological:Denies numbness, tingling or new  weaknesses Behavioral/Psych: Mood is stable, no new changes  Breast:  denies any pain or lumps or nodules in either breasts All other systems were reviewed with the patient and are negative.  I have reviewed the past medical history, past surgical history, social history and family history with the patient and they are unchanged from previous note.  ALLERGIES:  is allergic to amlodipine; aspirin; benazepril; minocycline; and xarelto.  MEDICATIONS:  Current Outpatient Prescriptions  Medication Sig Dispense Refill  . acetaminophen (TYLENOL) 500 MG tablet Take 2 tablet twice a day    . BIOTIN PO Take 1 tablet by mouth daily.     . Calcium Carbonate-Vitamin D (CALCIUM-VITAMIN D) 500-200 MG-UNIT per tablet Take 2 tablets by mouth daily.    . Cholecalciferol 1000 UNITS tablet Take 1,000 Units by mouth daily.    [name] levothyroxine (SYNTHROID, LEVOTHROID) 50 MCG tablet Take 50 mcg by mouth daily before breakfast.    . Multiple Vitamins-Minerals (CENTRUM SILVER PO) Take by mouth daily.    [name] omeprazole (PRILOSEC) 20 MG capsule Take 20 mg by mouth daily.     . simvastatin (ZOCOR) 10 MG tablet Take 10 mg by mouth at bedtime.  1  . tamoxifen (NOLVADEX) 20 MG tablet Take 1 tablet (20 mg total) by mouth daily. 90 tablet 3  . valsartan-hydrochlorothiazide (DIOVAN-HCT) 160-25 MG per tablet TAKE ONE TABLET BY MOUTH DAILY 90 tablet 0   No current facility-administered medications for this visit.    PHYSICAL EXAMINATION: ECOG PERFORMANCE STATUS: 0 - Asymptomatic  Filed Vitals:   07/11/14 1354  BP: 148/49  Pulse: 66  Temp: 97.7 F (36.5 C)  Resp: 18  Filed Weights   07/11/14 1354  Weight: 171 lb 9.6 oz (77.837 kg)    GENERAL:alert, no distress and comfortable SKIN: skin color, texture, turgor are normal, no rashes or significant lesions EYES: normal, Conjunctiva are pink and non-injected, sclera clear OROPHARYNX:no exudate, no erythema and lips, buccal mucosa, and tongue normal  NECK: supple,  thyroid normal size, non-tender, without nodularity LYMPH:  no palpable lymphadenopathy in the cervical, axillary or inguinal LUNGS: clear to auscultation and percussion with normal breathing effort HEART: regular rate & rhythm and no murmurs and no lower extremity edema ABDOMEN:abdomen soft, non-tender and normal bowel sounds Musculoskeletal:no cyanosis of digits and no clubbing  NEURO: alert & oriented x 3 with fluent speech, no focal motor/sensory deficits   LABORATORY DATA:  I have reviewed the data as listed   Chemistry      Component Value Date/Time   NA 142 01/11/2014 0853   NA 143 03/17/2012 1041   K 3.8 01/11/2014 0853   K 3.5 03/17/2012 1041   CL 103 07/08/2012 1257   CL 101 03/17/2012 1041   CO2 29 01/11/2014 0853   CO2 31 03/17/2012 1041   BUN 30.0* 01/11/2014 0853   BUN 18 03/17/2012 1041   CREATININE 1.4* 01/11/2014 0853   CREATININE 1.00 03/17/2012 1041      Component Value Date/Time   CALCIUM 9.8 01/11/2014 0853   CALCIUM 10.2 03/17/2012 1041   ALKPHOS 19* 01/11/2014 0853   ALKPHOS 21* 03/17/2012 1041   AST 24 01/11/2014 0853   AST 26 03/17/2012 1041   ALT 21 01/11/2014 0853   ALT 19 03/17/2012 1041   BILITOT 0.51 01/11/2014 0853   BILITOT 0.4 03/17/2012 1041       Lab Results  Component Value Date   WBC 8.8 01/11/2014   HGB 12.2 01/11/2014   HCT 37.7 01/11/2014   MCV 92.6 01/11/2014   PLT 84* 01/11/2014   NEUTROABS 5.9 01/11/2014    ASSESSMENT & PLAN:  Primary cancer of upper outer quadrant of right female breast Right breast invasive ductal carcinoma T1b N0 M0 stage IA ER/PR positive HER-2 negative, status post lumpectomy and is currently on oral antiestrogen therapy with Aromasin 25 mg daily  Tamoxifen toxicities: 1 severe myalgias much improved 2. Diffuse muscle and bone pain (previously seen by orthopedics and spine surgeons, she has in fact undergone spine surgery, has bursae inflammations) She is also her primary caregiver for her  husband who is disabled . These symptoms are also significantly better.  Shes tolerating tamoxifen better than Aromasin  Breast cancer surveillance: 1. Breast exam 01/11/2014 is normal 2. 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 I discussed with the patient that if she develops excessive bruising or bleeding symptoms that she needs to call our office.   RTC in one year  No orders of the defined types were placed in this encounter.   The patient has a good understanding of the overall plan. she agrees with it. she will call with any problems that may develop before the next visit here.   Rulon Eisenmenger, MD

## 2014-07-11 NOTE — Telephone Encounter (Signed)
Appointments made and avs printed for patient °

## 2014-07-26 ENCOUNTER — Telehealth: Payer: Self-pay

## 2014-07-26 NOTE — Telephone Encounter (Signed)
Let pt know bone density results not posted yet.  Pt voiced understanding.

## 2014-07-29 ENCOUNTER — Telehealth: Payer: Self-pay

## 2014-07-29 NOTE — Telephone Encounter (Signed)
Bone Density results dtd 07/19/14 rcvd from [place].  Reviewed by Dr. Lavonda Jumbo to scan.

## 2014-08-10 ENCOUNTER — Telehealth: Payer: Self-pay

## 2014-08-10 NOTE — Telephone Encounter (Signed)
Pt called stating she never got the results of her bone density test. Called [facility] and asked them to refax the report. Dr Lindi Adie reviewed the original report and it was sent to be scanned on 7/15. It is not visible in media at present.

## 2014-08-11 NOTE — Telephone Encounter (Signed)
Bone Density results rcvd from [facility].  Mailed to pt.

## 2014-09-28 ENCOUNTER — Encounter: Payer: Self-pay | Admitting: Hematology and Oncology

## 2014-10-11 ENCOUNTER — Other Ambulatory Visit: Payer: Self-pay | Admitting: Hematology and Oncology

## 2014-10-11 DIAGNOSIS — C50411 Malignant neoplasm of upper-outer quadrant of right female breast: Secondary | ICD-10-CM

## 2014-10-21 ENCOUNTER — Other Ambulatory Visit: Payer: Self-pay | Admitting: *Deleted

## 2014-10-23 ENCOUNTER — Encounter: Payer: Self-pay | Admitting: Family Medicine

## 2014-10-24 ENCOUNTER — Other Ambulatory Visit: Payer: Medicare Other | Admitting: *Deleted

## 2014-10-26 ENCOUNTER — Ambulatory Visit (INDEPENDENT_AMBULATORY_CARE_PROVIDER_SITE_OTHER): Payer: Medicare Other | Admitting: Family Medicine

## 2014-10-26 ENCOUNTER — Encounter: Payer: Self-pay | Admitting: Family Medicine

## 2014-10-26 VITALS — BP 122/75 | HR 91 | Temp 98.0°F | Resp 16 | Ht 61.5 in | Wt 174.0 lb

## 2014-10-26 DIAGNOSIS — R7301 Impaired fasting glucose: Secondary | ICD-10-CM

## 2014-10-26 DIAGNOSIS — I10 Essential (primary) hypertension: Secondary | ICD-10-CM | POA: Diagnosis not present

## 2014-10-26 DIAGNOSIS — E785 Hyperlipidemia, unspecified: Secondary | ICD-10-CM | POA: Diagnosis not present

## 2014-10-26 DIAGNOSIS — Z23 Encounter for immunization: Secondary | ICD-10-CM | POA: Diagnosis not present

## 2014-10-26 LAB — BASIC METABOLIC PANEL
BUN: 22 mg/dL (ref 6–23)
CO2: 28 meq/L (ref 19–32)
CREATININE: 1.2 mg/dL (ref 0.40–1.20)
Calcium: 9.6 mg/dL (ref 8.4–10.5)
Chloride: 101 mEq/L (ref 96–112)
GFR: 45.71 mL/min — ABNORMAL LOW (ref >60.00)
Glucose, Bld: 115 mg/dL — ABNORMAL HIGH (ref 70–99)
Potassium: 4 mEq/L (ref 3.5–5.1)
SODIUM: 140 meq/L (ref 135–145)

## 2014-10-26 LAB — HEMOGLOBIN A1C: HEMOGLOBIN A1C: 6.1 % (ref 4.6–6.5)
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
## 2014-10-26 NOTE — Progress Notes (Signed)
Pre visit review using our clinic review tool, if applicable. No additional management support is needed unless otherwise documented below in the visit note. 

## 2014-12-14 DIAGNOSIS — M545 Low back pain: Secondary | ICD-10-CM | POA: Diagnosis not present

## 2014-12-14 DIAGNOSIS — M47816 Spondylosis without myelopathy or radiculopathy, lumbar region: Secondary | ICD-10-CM | POA: Diagnosis not present

## 2014-12-14 DIAGNOSIS — M961 Postlaminectomy syndrome, not elsewhere classified: Secondary | ICD-10-CM | POA: Diagnosis not present

## 2015-01-10 ENCOUNTER — Other Ambulatory Visit: Payer: Self-pay | Admitting: Hematology and Oncology

## 2015-01-10 ENCOUNTER — Other Ambulatory Visit: Payer: Self-pay

## 2015-01-10 DIAGNOSIS — Z853 Personal history of malignant neoplasm of breast: Secondary | ICD-10-CM

## 2015-01-25 ENCOUNTER — Other Ambulatory Visit: Payer: Self-pay | Admitting: Family Medicine

## 2015-01-25 NOTE — Telephone Encounter (Signed)
RF request for valsartan/hctz LOV: 10/26/14 Next ov: 04/26/15 Last written: 11/29/14 #90 w/ 0RF

## 2015-02-27 ENCOUNTER — Ambulatory Visit
Admission: RE | Admit: 2015-02-27 | Discharge: 2015-02-27 | Disposition: A | Discharge status: Home / Self Care | Payer: Medicare Other | Source: Ambulatory Visit | Attending: Hematology and Oncology | Admitting: Hematology and Oncology

## 2015-02-27 DIAGNOSIS — Z853 Personal history of malignant neoplasm of breast: Secondary | ICD-10-CM

## 2015-02-27 DIAGNOSIS — R928 Other abnormal and inconclusive findings on diagnostic imaging of breast: Secondary | ICD-10-CM | POA: Diagnosis not present

## 2015-04-25 ENCOUNTER — Encounter: Payer: Self-pay | Admitting: Family Medicine

## 2015-04-25 ENCOUNTER — Ambulatory Visit (INDEPENDENT_AMBULATORY_CARE_PROVIDER_SITE_OTHER): Payer: Medicare Other | Admitting: Family Medicine

## 2015-04-25 VITALS — BP 118/69 | HR 75 | Temp 98.0°F | Resp 16 | Ht 61.5 in | Wt 171.2 lb

## 2015-04-25 DIAGNOSIS — E785 Hyperlipidemia, unspecified: Secondary | ICD-10-CM | POA: Diagnosis not present

## 2015-04-25 DIAGNOSIS — N183 Chronic kidney disease, stage 3 unspecified: Secondary | ICD-10-CM

## 2015-04-25 DIAGNOSIS — N1832 Chronic kidney disease, stage 3b: Secondary | ICD-10-CM | POA: Insufficient documentation

## 2015-04-25 DIAGNOSIS — E039 Hypothyroidism, unspecified: Secondary | ICD-10-CM | POA: Diagnosis not present

## 2015-04-25 DIAGNOSIS — Z23 Encounter for immunization: Secondary | ICD-10-CM

## 2015-04-25 DIAGNOSIS — N189 Chronic kidney disease, unspecified: Secondary | ICD-10-CM | POA: Diagnosis not present

## 2015-04-25 DIAGNOSIS — I10 Essential (primary) hypertension: Secondary | ICD-10-CM

## 2015-04-25 LAB — LIPID PANEL
Cholesterol: 155 mg/dL (ref 0–200)
HDL: 39.1 mg/dL (ref >39.00)
LDL Cholesterol: 89 mg/dL (ref 0–99)
NONHDL: 115.53
Total CHOL/HDL Ratio: 4
Triglycerides: 135 mg/dL (ref 0.0–149.0)
VLDL: 27 mg/dL (ref 0.0–40.0)

## 2015-04-25 LAB — COMPREHENSIVE METABOLIC PANEL
ALT: 16 U/L (ref 0–35)
AST: 19 U/L (ref 0–37)
Albumin: 4.2 g/dL (ref 3.5–5.2)
Alkaline Phosphatase: 17 U/L — ABNORMAL LOW (ref 39–117)
BILIRUBIN TOTAL: 0.5 mg/dL (ref 0.2–1.2)
BUN: 24 mg/dL — ABNORMAL HIGH (ref 6–23)
CO2: 30 meq/L (ref 19–32)
CREATININE: 1.12 mg/dL (ref 0.40–1.20)
Calcium: 9.7 mg/dL (ref 8.4–10.5)
Chloride: 103 mEq/L (ref 96–112)
GFR: 49.44 mL/min — ABNORMAL LOW (ref >60.00)
GLUCOSE: 111 mg/dL — AB (ref 70–99)
Potassium: 4.4 mEq/L (ref 3.5–5.1)
Sodium: 142 mEq/L (ref 135–145)
Total Protein: 6.6 g/dL (ref 6.0–8.3)

## 2015-04-25 LAB — TSH: TSH: 1.78 u[IU]/mL (ref 0.35–4.50)

## 2015-04-25 LAB — MAGNESIUM: Magnesium: 1.7 mg/dL (ref 1.5–2.5)
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
## 2015-04-25 NOTE — Addendum Note (Signed)
Addended by: Onalee Hua on: 04/25/2015 10:45 AM   Modules accepted: Orders

## 2015-04-25 NOTE — Progress Notes (Signed)
Pre visit review using our clinic review tool, if applicable. No additional management support is needed unless otherwise documented below in the visit note. 

## 2015-04-26 ENCOUNTER — Ambulatory Visit: Payer: Medicare Other | Admitting: Family Medicine

## 2015-06-19 ENCOUNTER — Other Ambulatory Visit: Payer: Self-pay | Admitting: Family Medicine

## 2015-06-19 DIAGNOSIS — C50419 Malignant neoplasm of upper-outer quadrant of unspecified female breast: Secondary | ICD-10-CM

## 2015-06-19 MED ORDER — LEVOTHYROXINE SODIUM 50 MCG PO TABS: 50.0000 ug | ORAL_TABLET | Freq: Every day | ORAL | Status: DC

## 2015-06-19 MED ORDER — OMEPRAZOLE 20 MG PO CPDR: 20.0000 mg | DELAYED_RELEASE_CAPSULE | Freq: Every day | ORAL | Status: DC

## 2015-06-19 MED ORDER — SIMVASTATIN 10 MG PO TABS: 10.0000 mg | ORAL_TABLET | Freq: Every day | ORAL | Status: DC

## 2015-06-30 ENCOUNTER — Other Ambulatory Visit: Payer: Self-pay

## 2015-06-30 ENCOUNTER — Telehealth: Payer: Self-pay | Admitting: Hematology and Oncology

## 2015-06-30 NOTE — Progress Notes (Signed)
Received VM from pt wishing to reschedule appt.  Called pt back and explained I would relay the message to our scheduling department.  Pt wishes to cancel 1 year follow up appointment on 07/11/15 and reschedule for the morning of 08/23/15.  Pt informed she would be notified by our scheduling department with new date and time once scheduled.  Pt verbalized understanding and without further questions at time of call.

## 2015-06-30 NOTE — Telephone Encounter (Signed)
R/s appt to Aug per 6/16 pof. Letter sent to patient by mail

## 2015-07-03 ENCOUNTER — Telehealth: Payer: Self-pay | Admitting: Family Medicine

## 2015-07-03 NOTE — Telephone Encounter (Signed)
Pt advised and voiced understanding.   

## 2015-07-03 NOTE — Telephone Encounter (Signed)
Her hip bursa?  I am unable to do hip bursa injections.  Sorry.  I recommend she continue to go to ortho to get this.

## 2015-07-03 NOTE — Telephone Encounter (Signed)
Has been getting cortisone inj's for bursa pain with [facility]. Was wondering if you would would be able to give her those inj's b/c it would be far more convenient for her if you could than going to the Ortho MD.

## 2015-07-03 NOTE — Telephone Encounter (Signed)
Please advise. Thanks.  

## 2015-07-11 ENCOUNTER — Ambulatory Visit: Payer: Medicare Other | Admitting: Hematology and Oncology

## 2015-07-27 DIAGNOSIS — M5136 Other intervertebral disc degeneration, lumbar region: Secondary | ICD-10-CM | POA: Diagnosis not present

## 2015-07-27 DIAGNOSIS — M7061 Trochanteric bursitis, right hip: Secondary | ICD-10-CM | POA: Diagnosis not present

## 2015-07-27 DIAGNOSIS — M961 Postlaminectomy syndrome, not elsewhere classified: Secondary | ICD-10-CM | POA: Diagnosis not present

## 2015-07-27 DIAGNOSIS — M47816 Spondylosis without myelopathy or radiculopathy, lumbar region: Secondary | ICD-10-CM | POA: Diagnosis not present

## 2015-08-02 DIAGNOSIS — H259 Unspecified age-related cataract: Secondary | ICD-10-CM | POA: Diagnosis not present

## 2015-08-18 DIAGNOSIS — Z853 Personal history of malignant neoplasm of breast: Secondary | ICD-10-CM | POA: Diagnosis not present

## 2015-08-22 NOTE — Assessment & Plan Note (Signed)
Right breast invasive ductal carcinoma T1b N0 M0 stage IA ER/PR positive HER-2 negative, status post lumpectomy and is currently on oral antiestrogen therapy with Aromasin 25 mg daily  Tamoxifen toxicities: 1 severe myalgias much improved 2. Diffuse muscle and bone pain (previously seen by orthopedics and spine surgeons, she has in fact undergone spine surgery, has bursae inflammations) She is also her primary caregiver for her husband who is disabled . These symptoms are also significantly better.  Shes tolerating tamoxifen better than Aromasin  Breast cancer surveillance: 1. Breast exam 08/22/2015 is normal 2. 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 I discussed with the patient that if she develops excessive bruising or bleeding symptoms that she needs to call our office.   RTC in one year

## 2015-08-23 ENCOUNTER — Encounter: Payer: Self-pay | Admitting: Hematology and Oncology

## 2015-08-23 ENCOUNTER — Ambulatory Visit (HOSPITAL_BASED_OUTPATIENT_CLINIC_OR_DEPARTMENT_OTHER): Payer: Medicare Other | Admitting: Hematology and Oncology

## 2015-08-23 DIAGNOSIS — D696 Thrombocytopenia, unspecified: Secondary | ICD-10-CM | POA: Diagnosis not present

## 2015-08-23 DIAGNOSIS — C50411 Malignant neoplasm of upper-outer quadrant of right female breast: Secondary | ICD-10-CM | POA: Diagnosis not present

## 2015-08-23 NOTE — Progress Notes (Signed)
Patient Care Team: Tammi Sou, MD as PCP - General (Family Medicine) Juanita Craver, MD as Consulting Physician (Gastroenterology) Gaynelle Arabian, MD as Consulting Physician (Orthopedic Surgery) Nicholas Lose, MD as Consulting Physician (Hematology and Oncology)  DIAGNOSIS: Primary cancer of upper outer quadrant of right female breast [facility])   Staging form: Breast, AJCC 7th Edition   - Clinical: Stage IA (T1a, N0, cM0) - Unsigned         Staging comments: Staged at breast conference 2.26.14    - Pathologic: No stage assigned - Unsigned  SUMMARY OF ONCOLOGIC HISTORY:   Primary cancer of upper outer quadrant of right female breast ([place])   03/02/2012 Initial Diagnosis    Right breast biopsy: Invasive mammary cancer grade 1 ER 100%, PR positive, HER-2 negative, Ki-67 71% ductal type     03/18/2012 Surgery    Right lumpectomy: 0.9 cm invasive ductal carcinoma grade 1 DCIS, ER/PR positive HER-2 negative     04/07/2012 -  Anti-estrogen oral therapy    Aromasin 25 mg daily changed to tamoxifen 20 mg daily from 01/11/2014 for myalgias      CHIEF COMPLIANT:  Follow-up on tamoxifen terapy  INTERVAL HISTORY: Diane Lucas is a  82 year old with above-mentioned history of right breast cancer treated with lumpectomy and is currently on tamoxifen. She continues to have bursitis and has received injections. She is also scheduled to undergo epidural injection coming up. She denies any lumps or nodules in the breast. She was seen by Dr. Brantley Stage  recently. Her mammograms are done in February of year. She still takes care of her elderly husband and is managing it fairly well. She lives in [place] and stays very busy. 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  REVIEW OF SYSTEMS:   Constitutional: Denies fevers, chills or abnormal weight loss Eyes: Denies blurriness of vision Ears, nose, mouth, throat, and face: Denies mucositis or sore throat Respiratory: Denies cough, dyspnea or  wheezes Cardiovascular: Denies palpitation, chest discomfort Gastrointestinal:  Denies nausea, heartburn or change in bowel habits Skin: Denies abnormal skin rashes Lymphatics: Denies new lymphadenopathy or easy bruising Neurological:Denies numbness, tingling or new weaknesses Behavioral/Psych: Mood is stable, no new changes  Extremities: No lower extremity edema Breast:  denies any pain or lumps or nodules in either breasts All other systems were reviewed with the patient and are negative.  I have reviewed the past medical history, past surgical history, social history and family history with the patient and they are unchanged from previous note.  ALLERGIES:  is allergic to amlodipine; aspirin; benazepril; minocycline; and xarelto [rivaroxaban].  MEDICATIONS:  Current Outpatient Prescriptions  Medication Sig Dispense Refill  . acetaminophen (TYLENOL) 500 MG tablet Take 2 tablet twice a day    . BIOTIN PO Take 1 tablet by mouth daily.     . Calcium Carbonate-Vitamin D (CALCIUM-VITAMIN D) 500-200 MG-UNIT per tablet Take 2 tablets by mouth daily.    . Cholecalciferol 1000 UNITS tablet Take 1,000 Units by mouth daily.    [name] levothyroxine (SYNTHROID, LEVOTHROID) 50 MCG tablet Take 1 tablet (50 mcg total) by mouth daily before breakfast. 90 tablet 0  . Multiple Vitamins-Minerals (CENTRUM SILVER PO) Take by mouth daily.    [name] omeprazole (PRILOSEC) 20 MG capsule Take 1 capsule (20 mg total) by mouth daily. 90 capsule 0  . simvastatin (ZOCOR) 10 MG tablet Take 1 tablet (10 mg total) by mouth at bedtime. 90 tablet 0  . tamoxifen (NOLVADEX) 20  MG tablet Take 1 tablet (20 mg total) by mouth daily. 90 tablet 3  . valsartan-hydrochlorothiazide (DIOVAN-HCT) 160-25 MG tablet TAKE ONE TABLET BY MOUTH DAILY 90 tablet 3   No current facility-administered medications for this visit.     PHYSICAL EXAMINATION: ECOG PERFORMANCE STATUS: 0 - Asymptomatic  Vitals:   08/23/15 1314  BP: (!) 132/57  Pulse:  75  Resp: 18  Temp: 98.1 F (36.7 C)   Filed Weights   08/23/15 1314  Weight: 174 lb 8 oz (79.2 kg)    GENERAL:alert, no distress and comfortable SKIN: skin color, texture, turgor are normal, no rashes or significant lesions EYES: normal, Conjunctiva are pink and non-injected, sclera clear OROPHARYNX:no exudate, no erythema and lips, buccal mucosa, and tongue normal  NECK: supple, thyroid normal size, non-tender, without nodularity LYMPH:  no palpable lymphadenopathy in the cervical, axillary or inguinal LUNGS: clear to auscultation and percussion with normal breathing effort HEART: regular rate & rhythm and no murmurs and no lower extremity edema ABDOMEN:abdomen soft, non-tender and normal bowel sounds MUSCULOSKELETAL:no cyanosis of digits and no clubbing  NEURO: alert & oriented x 3 with fluent speech, no focal motor/sensory deficits EXTREMITIES: No lower extremity edema  LABORATORY DATA:  I have reviewed the data as listed   Chemistry      Component Value Date/Time   NA 142 04/25/2015 1011   NA 142 01/11/2014 0853   K 4.4 04/25/2015 1011   K 3.8 01/11/2014 0853   CL 103 04/25/2015 1011   CL 103 07/08/2012 1257   CO2 30 04/25/2015 1011   CO2 29 01/11/2014 0853   BUN 24 (H) 04/25/2015 1011   BUN 30.0 (H) 01/11/2014 0853   CREATININE 1.12 04/25/2015 1011   CREATININE 1.4 (H) 01/11/2014 0853      Component Value Date/Time   CALCIUM 9.7 04/25/2015 1011   CALCIUM 9.8 01/11/2014 0853   ALKPHOS 17 (L) 04/25/2015 1011   ALKPHOS 19 (L) 01/11/2014 0853   AST 19 04/25/2015 1011   AST 24 01/11/2014 0853   ALT 16 04/25/2015 1011   ALT 21 01/11/2014 0853   BILITOT 0.5 04/25/2015 1011   BILITOT 0.51 01/11/2014 0853       Lab Results  Component Value Date   WBC 8.8 01/11/2014   HGB 12.2 01/11/2014   HCT 37.7 01/11/2014   MCV 92.6 01/11/2014   PLT 84 (L) 01/11/2014   NEUTROABS 5.9 01/11/2014     ASSESSMENT & PLAN:  Primary cancer of upper outer quadrant of right  female breast Right breast invasive ductal carcinoma T1b N0 M0 stage IA ER/PR positive HER-2 negative, status post lumpectomy and is currently on oral antiestrogen therapy with Aromasin 25 mg daily  Tamoxifen toxicities: 1 severe myalgias much improved 2. Diffuse muscle and bone pain (previously seen by orthopedics and spine surgeons, she has in fact undergone spine surgery, has bursae inflammations) She is also her primary caregiver for her husband who is disabled . These symptoms are also significantly better.  Shes tolerating tamoxifen better than Aromasin  Breast cancer surveillance: 1. Breast exam 08/22/2015 is normal 2. 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 I discussed with the patient that if she develops excessive bruising or bleeding symptoms that she needs to call our office.  We are not repeating lab work because she gets labs done at her primary care physician office.  RTC in one year   No orders of the  defined types were placed in this encounter.  The patient has a good understanding of the overall plan. she agrees with it. she will call with any problems that may develop before the next visit here.   ,  K, MD 08/23/15    

## 2015-08-26 DIAGNOSIS — E079 Disorder of thyroid, unspecified: Secondary | ICD-10-CM | POA: Diagnosis not present

## 2015-08-26 DIAGNOSIS — I10 Essential (primary) hypertension: Secondary | ICD-10-CM | POA: Diagnosis not present

## 2015-08-26 DIAGNOSIS — Z79899 Other long term (current) drug therapy: Secondary | ICD-10-CM | POA: Diagnosis not present

## 2015-08-26 DIAGNOSIS — Z886 Allergy status to analgesic agent status: Secondary | ICD-10-CM | POA: Diagnosis not present

## 2015-08-26 DIAGNOSIS — M79605 Pain in left leg: Secondary | ICD-10-CM | POA: Diagnosis not present

## 2015-08-26 DIAGNOSIS — K219 Gastro-esophageal reflux disease without esophagitis: Secondary | ICD-10-CM | POA: Diagnosis not present

## 2015-08-26 DIAGNOSIS — E785 Hyperlipidemia, unspecified: Secondary | ICD-10-CM | POA: Diagnosis not present

## 2015-08-26 DIAGNOSIS — Z888 Allergy status to other drugs, medicaments and biological substances status: Secondary | ICD-10-CM | POA: Diagnosis not present

## 2015-08-26 DIAGNOSIS — Z881 Allergy status to other antibiotic agents status: Secondary | ICD-10-CM | POA: Diagnosis not present

## 2015-08-26 DIAGNOSIS — Z853 Personal history of malignant neoplasm of breast: Secondary | ICD-10-CM | POA: Diagnosis not present

## 2015-08-26 DIAGNOSIS — L03116 Cellulitis of left lower limb: Secondary | ICD-10-CM | POA: Diagnosis not present

## 2015-09-06 ENCOUNTER — Telehealth: Payer: Self-pay | Admitting: Hematology and Oncology

## 2015-09-06 NOTE — Telephone Encounter (Signed)
appt made per LOS; letter mailed °

## 2015-09-13 DIAGNOSIS — M5136 Other intervertebral disc degeneration, lumbar region: Secondary | ICD-10-CM | POA: Diagnosis not present

## 2015-09-16 ENCOUNTER — Other Ambulatory Visit: Payer: Self-pay | Admitting: Family Medicine

## 2015-10-17 ENCOUNTER — Other Ambulatory Visit: Payer: Self-pay | Admitting: Hematology and Oncology

## 2015-10-17 DIAGNOSIS — C50411 Malignant neoplasm of upper-outer quadrant of right female breast: Secondary | ICD-10-CM

## 2015-10-24 ENCOUNTER — Ambulatory Visit (INDEPENDENT_AMBULATORY_CARE_PROVIDER_SITE_OTHER): Payer: Medicare Other | Admitting: Family Medicine

## 2015-10-24 ENCOUNTER — Encounter: Payer: Self-pay | Admitting: Family Medicine

## 2015-10-24 VITALS — BP 117/74 | HR 79 | Temp 97.6°F | Resp 16 | Wt 173.1 lb

## 2015-10-24 DIAGNOSIS — E7800 Pure hypercholesterolemia, unspecified: Secondary | ICD-10-CM | POA: Diagnosis not present

## 2015-10-24 DIAGNOSIS — E039 Hypothyroidism, unspecified: Secondary | ICD-10-CM

## 2015-10-24 DIAGNOSIS — Z23 Encounter for immunization: Secondary | ICD-10-CM

## 2015-10-24 DIAGNOSIS — N2889 Other specified disorders of kidney and ureter: Secondary | ICD-10-CM

## 2015-10-24 DIAGNOSIS — I10 Essential (primary) hypertension: Secondary | ICD-10-CM

## 2015-10-24 DIAGNOSIS — Z0000 Encounter for general adult medical examination without abnormal findings: Secondary | ICD-10-CM

## 2015-10-24 DIAGNOSIS — N183 Chronic kidney disease, stage 3 unspecified: Secondary | ICD-10-CM

## 2015-10-24 LAB — BASIC METABOLIC PANEL
BUN: 34 mg/dL — AB (ref 6–23)
CALCIUM: 9.4 mg/dL (ref 8.4–10.5)
CO2: 28 mEq/L (ref 19–32)
Chloride: 102 mEq/L (ref 96–112)
Creatinine, Ser: 1.23 mg/dL — ABNORMAL HIGH (ref 0.40–1.20)
GFR: 44.32 mL/min — AB (ref >60.00)
GLUCOSE: 116 mg/dL — AB (ref 70–99)
POTASSIUM: 4.4 meq/L (ref 3.5–5.1)
SODIUM: 140 meq/L (ref 135–145)

## 2015-10-24 NOTE — Progress Notes (Signed)
Pre visit review using our clinic review tool, if applicable. No additional management support is needed unless otherwise documented below in the visit note. 

## 2015-10-24 NOTE — Patient Instructions (Signed)
Try 3 oz of tonic water morning and night for your muscle cramps.

## 2015-10-24 NOTE — Addendum Note (Signed)
Addended by: Gordy Councilman on: 10/24/2015 10:48 AM   Modules accepted: Orders
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
## 2015-10-25 ENCOUNTER — Other Ambulatory Visit (INDEPENDENT_AMBULATORY_CARE_PROVIDER_SITE_OTHER): Payer: Medicare Other

## 2015-10-25 DIAGNOSIS — R7301 Impaired fasting glucose: Secondary | ICD-10-CM | POA: Diagnosis not present

## 2015-10-25 LAB — HEMOGLOBIN A1C: Hgb A1c MFr Bld: 6.4 % (ref 4.6–6.5)

## 2015-10-30 ENCOUNTER — Other Ambulatory Visit: Payer: Self-pay | Admitting: Family Medicine

## 2015-11-23 DIAGNOSIS — M47816 Spondylosis without myelopathy or radiculopathy, lumbar region: Secondary | ICD-10-CM | POA: Diagnosis not present

## 2015-11-23 DIAGNOSIS — M5136 Other intervertebral disc degeneration, lumbar region: Secondary | ICD-10-CM | POA: Diagnosis not present

## 2015-11-23 DIAGNOSIS — M7061 Trochanteric bursitis, right hip: Secondary | ICD-10-CM | POA: Diagnosis not present

## 2015-11-23 DIAGNOSIS — M7062 Trochanteric bursitis, left hip: Secondary | ICD-10-CM | POA: Diagnosis not present

## 2015-12-13 DIAGNOSIS — M5136 Other intervertebral disc degeneration, lumbar region: Secondary | ICD-10-CM | POA: Diagnosis not present

## 2015-12-18 ENCOUNTER — Other Ambulatory Visit: Payer: Self-pay | Admitting: Family Medicine

## 2015-12-18 NOTE — Telephone Encounter (Signed)
LOV: 10/24/15 NOV: 04/23/16  04/25/15 TSH 1.78  RF request for simvastatin Last written: 09/19/15 #90 w/ 1RF  RF request for omeprazole Last written: 09/19/15 390 w/ 1RF  RF request for levothyroxine Last written: 06/19/15 #90 w/ 0RF

## 2016-01-02 ENCOUNTER — Telehealth: Payer: Self-pay | Admitting: Family Medicine

## 2016-01-02 DIAGNOSIS — R7303 Prediabetes: Secondary | ICD-10-CM

## 2016-01-02 NOTE — Telephone Encounter (Signed)
Patient called and asked if she could come into the office for repeat labs because she states that she has cut out candy and has lost weight.  Can you please advise.

## 2016-01-02 NOTE — Telephone Encounter (Signed)
Tell her it is too early to repeat the labs.  Tell her to make lab appt 6 weeks from now.  I'll enter lab orders (she needs to be fasting).-thx

## 2016-01-03 NOTE — Telephone Encounter (Signed)
Left message for pt to call back  °

## 2016-01-03 NOTE — Telephone Encounter (Signed)
Pt advised and voiced understanding.   

## 2016-01-15 DIAGNOSIS — E119 Type 2 diabetes mellitus without complications: Secondary | ICD-10-CM

## 2016-01-15 HISTORY — PX: BREAST BIOPSY: SHX20

## 2016-01-15 HISTORY — DX: Type 2 diabetes mellitus without complications: E11.9

## 2016-02-07 DIAGNOSIS — M5136 Other intervertebral disc degeneration, lumbar region: Secondary | ICD-10-CM | POA: Diagnosis not present

## 2016-02-26 ENCOUNTER — Other Ambulatory Visit: Payer: Self-pay | Admitting: Hematology and Oncology

## 2016-02-26 DIAGNOSIS — Z853 Personal history of malignant neoplasm of breast: Secondary | ICD-10-CM

## 2016-03-12 ENCOUNTER — Ambulatory Visit
Admission: RE | Admit: 2016-03-12 | Discharge: 2016-03-12 | Disposition: A | Discharge status: Home / Self Care | Payer: Medicare Other | Source: Ambulatory Visit | Attending: Hematology and Oncology | Admitting: Hematology and Oncology

## 2016-03-12 ENCOUNTER — Other Ambulatory Visit: Payer: Self-pay | Admitting: Hematology and Oncology

## 2016-03-12 DIAGNOSIS — R928 Other abnormal and inconclusive findings on diagnostic imaging of breast: Secondary | ICD-10-CM | POA: Diagnosis not present

## 2016-03-12 DIAGNOSIS — R921 Mammographic calcification found on diagnostic imaging of breast: Secondary | ICD-10-CM

## 2016-03-12 DIAGNOSIS — Z853 Personal history of malignant neoplasm of breast: Secondary | ICD-10-CM

## 2016-03-14 ENCOUNTER — Encounter: Payer: Self-pay | Admitting: Family Medicine

## 2016-03-14 ENCOUNTER — Ambulatory Visit
Admission: RE | Admit: 2016-03-14 | Discharge: 2016-03-14 | Disposition: A | Discharge status: Home / Self Care | Payer: Medicare Other | Source: Ambulatory Visit | Attending: Hematology and Oncology | Admitting: Hematology and Oncology

## 2016-03-14 ENCOUNTER — Other Ambulatory Visit: Payer: Self-pay | Admitting: Hematology and Oncology

## 2016-03-14 DIAGNOSIS — R921 Mammographic calcification found on diagnostic imaging of breast: Secondary | ICD-10-CM

## 2016-03-14 DIAGNOSIS — Z853 Personal history of malignant neoplasm of breast: Secondary | ICD-10-CM

## 2016-03-14 DIAGNOSIS — N641 Fat necrosis of breast: Secondary | ICD-10-CM | POA: Diagnosis not present

## 2016-03-15 ENCOUNTER — Encounter: Payer: Self-pay | Admitting: Family Medicine

## 2016-03-16 ENCOUNTER — Other Ambulatory Visit: Payer: Self-pay | Admitting: Family Medicine

## 2016-04-18 ENCOUNTER — Telehealth: Payer: Self-pay

## 2016-04-18 NOTE — Telephone Encounter (Signed)
LM requesting call back to schedule AWV. Requesting patient to complete AWV with [facility] on 04/23/2016 @ 9am or 10:30am (has appointment with PCP @ 10am).

## 2016-04-23 ENCOUNTER — Encounter: Payer: Self-pay | Admitting: Family Medicine

## 2016-04-23 ENCOUNTER — Ambulatory Visit (INDEPENDENT_AMBULATORY_CARE_PROVIDER_SITE_OTHER): Payer: Medicare Other | Admitting: Family Medicine

## 2016-04-23 VITALS — BP 111/58 | HR 78 | Temp 97.8°F | Resp 16 | Ht 61.0 in | Wt 170.5 lb

## 2016-04-23 DIAGNOSIS — D693 Immune thrombocytopenic purpura: Secondary | ICD-10-CM

## 2016-04-23 DIAGNOSIS — R7303 Prediabetes: Secondary | ICD-10-CM

## 2016-04-23 DIAGNOSIS — Z0000 Encounter for general adult medical examination without abnormal findings: Secondary | ICD-10-CM | POA: Diagnosis not present

## 2016-04-23 DIAGNOSIS — Z13220 Encounter for screening for lipoid disorders: Secondary | ICD-10-CM | POA: Diagnosis not present

## 2016-04-23 DIAGNOSIS — E039 Hypothyroidism, unspecified: Secondary | ICD-10-CM

## 2016-04-23 DIAGNOSIS — N183 Chronic kidney disease, stage 3 unspecified: Secondary | ICD-10-CM

## 2016-04-23 LAB — COMPREHENSIVE METABOLIC PANEL
ALBUMIN: 4.4 g/dL (ref 3.5–5.2)
ALK PHOS: 14 U/L — AB (ref 39–117)
ALT: 21 U/L (ref 0–35)
AST: 24 U/L (ref 0–37)
BILIRUBIN TOTAL: 0.4 mg/dL (ref 0.2–1.2)
BUN: 23 mg/dL (ref 6–23)
CO2: 29 mEq/L (ref 19–32)
CREATININE: 1.14 mg/dL (ref 0.40–1.20)
Calcium: 9.7 mg/dL (ref 8.4–10.5)
Chloride: 103 mEq/L (ref 96–112)
GFR: 48.32 mL/min — ABNORMAL LOW (ref >60.00)
GLUCOSE: 114 mg/dL — AB (ref 70–99)
Potassium: 4.2 mEq/L (ref 3.5–5.1)
SODIUM: 142 meq/L (ref 135–145)
TOTAL PROTEIN: 6.8 g/dL (ref 6.0–8.3)

## 2016-04-23 LAB — CBC WITH DIFFERENTIAL/PLATELET
BASOS ABS: 0.1 10*3/uL (ref 0.0–0.1)
BASOS PCT: 0.7 % (ref 0.0–3.0)
EOS ABS: 0 10*3/uL (ref 0.0–0.7)
Eosinophils Relative: 0.6 % (ref 0.0–5.0)
HEMATOCRIT: 37.3 % (ref 36.0–46.0)
Hemoglobin: 12.5 g/dL (ref 12.0–15.0)
LYMPHS PCT: 23.6 % (ref 12.0–46.0)
Lymphs Abs: 1.8 10*3/uL (ref 0.7–4.0)
MCHC: 33.4 g/dL (ref 30.0–36.0)
MCV: 90.8 fl (ref 78.0–100.0)
MONO ABS: 0.5 10*3/uL (ref 0.1–1.0)
Monocytes Relative: 6.6 % (ref 3.0–12.0)
Neutro Abs: 5.2 10*3/uL (ref 1.4–7.7)
Neutrophils Relative %: 68.5 % (ref 43.0–77.0)
PLATELETS: 90 10*3/uL — AB (ref 150.0–400.0)
RBC: 4.1 Mil/uL (ref 3.87–5.11)
RDW: 11.9 % (ref 11.5–15.5)
WBC: 7.7 10*3/uL (ref 4.0–10.5)

## 2016-04-23 LAB — LIPID PANEL
CHOLESTEROL: 156 mg/dL (ref 0–200)
HDL: 38.3 mg/dL — ABNORMAL LOW (ref >39.00)
LDL Cholesterol: 87 mg/dL (ref 0–99)
NonHDL: 117.86
Total CHOL/HDL Ratio: 4
Triglycerides: 156 mg/dL — ABNORMAL HIGH (ref 0.0–149.0)
VLDL: 31.2 mg/dL (ref 0.0–40.0)

## 2016-04-23 LAB — HEMOGLOBIN A1C: HEMOGLOBIN A1C: 6.1 % (ref 4.6–6.5)

## 2016-04-23 LAB — TSH: TSH: 2.46 u[IU]/mL (ref 0.35–4.50)

## 2016-04-23 MED ORDER — LEVOTHYROXINE SODIUM 50 MCG PO TABS
ORAL_TABLET | ORAL | 3 refills | Status: DC
Start: 2016-04-23 — End: 2017-03-18
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
## 2016-04-23 NOTE — Progress Notes (Signed)
Pre visit review using our clinic review tool, if applicable. No additional management support is needed unless otherwise documented below in the visit note. 
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
## 2016-04-23 NOTE — Patient Instructions (Addendum)
Continue doing brain stimulating activities (puzzles, reading, adult coloring books, staying active) to keep memory sharp.   Continue to eat heart healthy diet (full of fruits, vegetables, whole grains, lean protein, water--limit salt, fat, and sugar intake) and increase physical activity as tolerated.  Ask oncologist about bone scan   Health Maintenance, Female Adopting a healthy lifestyle and getting preventive care can go a long way to promote health and wellness. Talk with your health care provider about what schedule of regular examinations is right for you. This is a good chance for you to check in with your provider about disease prevention and staying healthy. In between checkups, there are plenty of things you can do on your own. Experts have done a lot of research about which lifestyle changes and preventive measures are most likely to keep you healthy. Ask your health care provider for more information. Weight and diet Eat a healthy diet  Be sure to include plenty of vegetables, fruits, low-fat dairy products, and lean protein.  Do not eat a lot of foods high in solid fats, added sugars, or salt.  Get regular exercise. This is one of the most important things you can do for your health.  Most adults should exercise for at least 150 minutes each week. The exercise should increase your heart rate and make you sweat (moderate-intensity exercise).  Most adults should also do strengthening exercises at least twice a week. This is in addition to the moderate-intensity exercise. Maintain a healthy weight  Body mass index (BMI) is a measurement that can be used to identify possible weight problems. It estimates body fat based on height and weight. Your health care provider can help determine your BMI and help you achieve or maintain a healthy weight.  For females 57 years of age and older:  A BMI below 18.5 is considered underweight.  A BMI of 18.5 to 24.9 is normal.  A BMI of 25  to 29.9 is considered overweight.  A BMI of 30 and above is considered obese. Watch levels of cholesterol and blood lipids  You should start having your blood tested for lipids and cholesterol at 81 years of age, then have this test every 5 years.  You may need to have your cholesterol levels checked more often if:  Your lipid or cholesterol levels are high.  You are older than 81 years of age.  You are at high risk for heart disease. Cancer screening Lung Cancer  Lung cancer screening is recommended for adults 21-80 years old who are at high risk for lung cancer because of a history of smoking.  A yearly low-dose CT scan of the lungs is recommended for people who:  Currently smoke.  Have quit within the past 15 years.  Have at least a 30-pack-year history of smoking. A pack year is smoking an average of one pack of cigarettes a day for 1 year.  Yearly screening should continue until it has been 15 years since you quit.  Yearly screening should stop if you develop a health problem that would prevent you from having lung cancer treatment. Breast Cancer  Practice breast self-awareness. This means understanding how your breasts normally appear and feel.  It also means doing regular breast self-exams. Let your health care provider know about any changes, no matter how small.  If you are in your 20s or 30s, you should have a clinical breast exam (CBE) by a health care provider every 1-3 years as part of a regular health  exam.  If you are 40 or older, have a CBE every year. Also consider having a breast X-ray (mammogram) every year.  If you have a family history of breast cancer, talk to your health care provider about genetic screening.  If you are at high risk for breast cancer, talk to your health care provider about having an MRI and a mammogram every year.  Breast cancer gene (BRCA) assessment is recommended for women who have family members with BRCA-related cancers.  BRCA-related cancers include:  Breast.  Ovarian.  Tubal.  Peritoneal cancers.  Results of the assessment will determine the need for genetic counseling and BRCA1 and BRCA2 testing. Cervical Cancer  Your health care provider may recommend that you be screened regularly for cancer of the pelvic organs (ovaries, uterus, and vagina). This screening involves a pelvic examination, including checking for microscopic changes to the surface of your cervix (Pap test). You may be encouraged to have this screening done every 3 years, beginning at age 55.  For women ages 11-65, health care providers may recommend pelvic exams and Pap testing every 3 years, or they may recommend the Pap and pelvic exam, combined with testing for human papilloma virus (HPV), every 5 years. Some types of HPV increase your risk of cervical cancer. Testing for HPV may also be done on women of any age with unclear Pap test results.  Other health care providers may not recommend any screening for nonpregnant women who are considered low risk for pelvic cancer and who do not have symptoms. Ask your health care provider if a screening pelvic exam is right for you.  If you have had past treatment for cervical cancer or a condition that could lead to cancer, you need Pap tests and screening for cancer for at least 20 years after your treatment. If Pap tests have been discontinued, your risk factors (such as having a new sexual partner) need to be reassessed to determine if screening should resume. Some women have medical problems that increase the chance of getting cervical cancer. In these cases, your health care provider may recommend more frequent screening and Pap tests. Colorectal Cancer  This type of cancer can be detected and often prevented.  Routine colorectal cancer screening usually begins at 81 years of age and continues through 81 years of age.  Your health care provider may recommend screening at an earlier age if you  have risk factors for colon cancer.  Your health care provider may also recommend using home test kits to check for hidden blood in the stool.  A small camera at the end of a tube can be used to examine your colon directly (sigmoidoscopy or colonoscopy). This is done to check for the earliest forms of colorectal cancer.  Routine screening usually begins at age 108.  Direct examination of the colon should be repeated every 5-10 years through 81 years of age. However, you may need to be screened more often if early forms of precancerous polyps or small growths are found. Skin Cancer  Check your skin from head to toe regularly.  Tell your health care provider about any new moles or changes in moles, especially if there is a change in a mole's shape or color.  Also tell your health care provider if you have a mole that is larger than the size of a pencil eraser.  Always use sunscreen. Apply sunscreen liberally and repeatedly throughout the day.  Protect yourself by wearing long sleeves, pants, a wide-brimmed hat, and  sunglasses whenever you are outside. Heart disease, diabetes, and high blood pressure  High blood pressure causes heart disease and increases the risk of stroke. High blood pressure is more likely to develop in:  People who have blood pressure in the high end of the normal range (130-139/85-89 mm Hg).  People who are overweight or obese.  People who are African American.  If you are 90-11 years of age, have your blood pressure checked every 3-5 years. If you are 45 years of age or older, have your blood pressure checked every year. You should have your blood pressure measured twice-once when you are at a hospital or clinic, and once when you are not at a hospital or clinic. Record the average of the two measurements. To check your blood pressure when you are not at a hospital or clinic, you can use:  An automated blood pressure machine at a pharmacy.  A home blood pressure  monitor.  If you are between 101 years and 80 years old, ask your health care provider if you should take aspirin to prevent strokes.  Have regular diabetes screenings. This involves taking a blood sample to check your fasting blood sugar level.  If you are at a normal weight and have a low risk for diabetes, have this test once every three years after 81 years of age.  If you are overweight and have a high risk for diabetes, consider being tested at a younger age or more often. Preventing infection Hepatitis B  If you have a higher risk for hepatitis B, you should be screened for this virus. You are considered at high risk for hepatitis B if:  You were born in a country where hepatitis B is common. Ask your health care provider which countries are considered high risk.  Your parents were born in a high-risk country, and you have not been immunized against hepatitis B (hepatitis B vaccine).  You have HIV or AIDS.  You use needles to inject street drugs.  You live with someone who has hepatitis B.  You have had sex with someone who has hepatitis B.  You get hemodialysis treatment.  You take certain medicines for conditions, including cancer, organ transplantation, and autoimmune conditions. Hepatitis C  Blood testing is recommended for:  Everyone born from [birth year] through 1963.  Anyone with known risk factors for hepatitis C. Sexually transmitted infections (STIs)  You should be screened for sexually transmitted infections (STIs) including gonorrhea and chlamydia if:  You are sexually active and are younger than 81 years of age.  You are older than 81 years of age and your health care provider tells you that you are at risk for this type of infection.  Your sexual activity has changed since you were last screened and you are at an increased risk for chlamydia or gonorrhea. Ask your health care provider if you are at risk.  If you do not have HIV, but are at risk, it may be  recommended that you take a prescription medicine daily to prevent HIV infection. This is called pre-exposure prophylaxis (PrEP). You are considered at risk if:  You are sexually active and do not regularly use condoms or know the HIV status of your partner(s).  You take drugs by injection.  You are sexually active with a partner who has HIV. Talk with your health care provider about whether you are at high risk of being infected with HIV. If you choose to begin PrEP, you should first be tested  for HIV. You should then be tested every 3 months for as long as you are taking PrEP. Pregnancy  If you are premenopausal and you may become pregnant, ask your health care provider about preconception counseling.  If you may become pregnant, take 400 to 800 micrograms (mcg) of folic acid every day.  If you want to prevent pregnancy, talk to your health care provider about birth control (contraception). Osteoporosis and menopause  Osteoporosis is a disease in which the bones lose minerals and strength with aging. This can result in serious bone fractures. Your risk for osteoporosis can be identified using a bone density scan.  If you are 15 years of age or older, or if you are at risk for osteoporosis and fractures, ask your health care provider if you should be screened.  Ask your health care provider whether you should take a calcium or vitamin D supplement to lower your risk for osteoporosis.  Menopause may have certain physical symptoms and risks.  Hormone replacement therapy may reduce some of these symptoms and risks. Talk to your health care provider about whether hormone replacement therapy is right for you. Follow these instructions at home:  Schedule regular health, dental, and eye exams.  Stay current with your immunizations.  Do not use any tobacco products including cigarettes, chewing tobacco, or electronic cigarettes.  If you are pregnant, do not drink alcohol.  If you are  breastfeeding, limit how much and how often you drink alcohol.  Limit alcohol intake to no more than 1 drink per day for nonpregnant women. One drink equals 12 ounces of beer, 5 ounces of wine, or 1 ounces of hard liquor.  Do not use street drugs.  Do not share needles.  Ask your health care provider for help if you need support or information about quitting drugs.  Tell your health care provider if you often feel depressed.  Tell your health care provider if you have ever been abused or do not feel safe at home. This information is not intended to replace advice given to you by your health care provider. Make sure you discuss any questions you have with your health care provider. Document Released: 07/16/2010 Document Revised: 06/08/2015 Document Reviewed: 10/04/2014 Elsevier Interactive Patient Education  2017 [employer].

## 2016-04-24 NOTE — Progress Notes (Signed)
AWV reviewed and agree.  Signed:  Crissie Sickles, MD           04/24/2016

## 2016-05-01 ENCOUNTER — Other Ambulatory Visit: Payer: Self-pay | Admitting: Family Medicine

## 2016-05-01 NOTE — Telephone Encounter (Signed)
[facility].  RF request for valsartan/hctz LOV: 04/23/16 Next ov: 10/23/16 Last written: 10/30/15 #90 w/1RF

## 2016-05-14 DIAGNOSIS — M5136 Other intervertebral disc degeneration, lumbar region: Secondary | ICD-10-CM | POA: Diagnosis not present

## 2016-06-18 ENCOUNTER — Other Ambulatory Visit: Payer: Self-pay | Admitting: Family Medicine

## 2016-06-18 NOTE — Telephone Encounter (Signed)
[facility].  RF request for simvastatin LOV: 04/23/16 Next ov: 10/23/16 Last written: 12/18/15 #90 w/ 1RF

## 2016-07-20 ENCOUNTER — Other Ambulatory Visit: Payer: Self-pay | Admitting: Hematology and Oncology

## 2016-07-20 DIAGNOSIS — C50411 Malignant neoplasm of upper-outer quadrant of right female breast: Secondary | ICD-10-CM

## 2016-07-22 NOTE — Telephone Encounter (Signed)
F/u yearly appt with Dr.Gudena aug 2018

## 2016-07-25 DIAGNOSIS — L570 Actinic keratosis: Secondary | ICD-10-CM | POA: Diagnosis not present

## 2016-07-25 DIAGNOSIS — Z08 Encounter for follow-up examination after completed treatment for malignant neoplasm: Secondary | ICD-10-CM | POA: Diagnosis not present

## 2016-07-25 DIAGNOSIS — Z85828 Personal history of other malignant neoplasm of skin: Secondary | ICD-10-CM | POA: Diagnosis not present

## 2016-08-18 ENCOUNTER — Telehealth: Payer: Self-pay

## 2016-08-18 NOTE — Telephone Encounter (Signed)
Called and left a message with a new appt as dr Lindi Adie is on call 8/9

## 2016-08-22 ENCOUNTER — Ambulatory Visit: Payer: Medicare Other | Admitting: Hematology and Oncology

## 2016-08-30 ENCOUNTER — Telehealth: Payer: Self-pay | Admitting: Family Medicine

## 2016-08-30 NOTE — Telephone Encounter (Signed)
A user error has taken place: encounter opened in error, closed for administrative reasons.

## 2016-09-02 ENCOUNTER — Encounter: Payer: Self-pay | Admitting: Hematology and Oncology

## 2016-09-02 ENCOUNTER — Ambulatory Visit (HOSPITAL_BASED_OUTPATIENT_CLINIC_OR_DEPARTMENT_OTHER): Payer: Medicare Other | Admitting: Hematology and Oncology

## 2016-09-02 DIAGNOSIS — Z79810 Long term (current) use of selective estrogen receptor modulators (SERMs): Secondary | ICD-10-CM | POA: Diagnosis not present

## 2016-09-02 DIAGNOSIS — C50411 Malignant neoplasm of upper-outer quadrant of right female breast: Secondary | ICD-10-CM | POA: Diagnosis not present

## 2016-09-02 DIAGNOSIS — Z170 Estrogen receptor positive status [ER+]: Secondary | ICD-10-CM

## 2016-09-02 MED ORDER — TAMOXIFEN CITRATE 20 MG PO TABS: 20.0000 mg | ORAL_TABLET | Freq: Every day | ORAL | 3 refills | Status: DC

## 2016-09-02 NOTE — Assessment & Plan Note (Signed)
Right breast invasive ductal carcinoma T1b N0 M0 stage IA ER/PR positive HER-2 negative, status post lumpectomy and is currently on oral antiestrogen therapy with Aromasin 25 mg daily  Tamoxifen toxicities: 1 severe myalgias much improved 2. Diffuse muscle and bone pain (previously seen by orthopedics and spine surgeons, she has in fact undergone spine surgery, has bursae inflammations)  She is also her primary caregiver for her husband who is disabled . These symptoms are also significantly better.  Shes tolerating tamoxifen better than Aromasin  Breast cancer surveillance: 1. Breast exam 09/02/2016  is normal 2. Mammogram 03/12/2016: Stroke indeterminate calcifications within the lumpectomy site in the quadrant posterior depth 2.2 x 0.5 x 0.6 cm, biopsy 03/14/2016: Fat necrosis  Return to clinic in 1 year for follow-up

## 2016-09-02 NOTE — Progress Notes (Signed)
Patient Care Team: Tammi Sou, MD as PCP - General (Family Medicine) Juanita Craver, MD as Consulting Physician (Gastroenterology) Gaynelle Arabian, MD as Consulting Physician (Orthopedic Surgery) Nicholas Lose, MD as Consulting Physician (Hematology and Oncology) Newton Pigg, MD as Consulting Physician (Obstetrics and Gynecology)  DIAGNOSIS:  Encounter Diagnosis  Name Primary?  . Primary cancer of upper outer quadrant of right female breast ([place])     SUMMARY OF ONCOLOGIC HISTORY:   Primary cancer of upper outer quadrant of right female breast ([place])   03/02/2012 Initial Diagnosis    Right breast biopsy: Invasive mammary cancer grade 1 ER 100%, PR positive, HER-2 negative, Ki-67 71% ductal type      03/18/2012 Surgery    Right lumpectomy: 0.9 cm invasive ductal carcinoma grade 1 DCIS, ER/PR positive HER-2 negative      04/07/2012 -  Anti-estrogen oral therapy    Aromasin 25 mg daily changed to tamoxifen 20 mg daily from 01/11/2014 for myalgias       CHIEF COMPLIANT: Follow-up on tamoxifen therapy  INTERVAL HISTORY: Diane Lucas is a 83 year old with above-mentioned history of right breast cancer currently on tamoxifen therapy. She could not tolerate Aromasin. Her husband passed away after 45 years of marriage. She has been his primary caregiver. She is still mourning but it appears to be getting better rest. She continues to have some intermittent aches and pains related to the antiestrogen treatment but they're not bothering her significantly. She has had chronic back issues for which she gets every 3 months injections. These have been there even before she took antiestrogen therapy. She will finish 5 years of antiestrogen therapy by end of March 2019.  REVIEW OF SYSTEMS:   Constitutional: Denies fevers, chills or abnormal weight loss Eyes: Denies blurriness of vision Ears, nose, mouth, throat, and face: Denies mucositis or sore throat Respiratory: Denies cough, dyspnea or  wheezes Cardiovascular: Denies palpitation, chest discomfort Gastrointestinal:  Denies nausea, heartburn or change in bowel habits Skin: Denies abnormal skin rashes Lymphatics: Denies new lymphadenopathy or easy bruising Neurological:Denies numbness, tingling or new weaknesses Behavioral/Psych: Mood is stable, no new changes  Extremities: No lower extremity edema Breast:  denies any pain or lumps or nodules in either breasts All other systems were reviewed with the patient and are negative.  I have reviewed the past medical history, past surgical history, social history and family history with the patient and they are unchanged from previous note.  ALLERGIES:  is allergic to amlodipine; aspirin; benazepril; minocycline; and xarelto [rivaroxaban].  MEDICATIONS:  Current Outpatient Prescriptions  Medication Sig Dispense Refill  . acetaminophen (TYLENOL) 500 MG tablet Take 2 tablet twice a day    . BIOTIN PO Take 1 tablet by mouth daily.     . Calcium Carbonate-Vitamin D (CALCIUM-VITAMIN D) 500-200 MG-UNIT per tablet Take 2 tablets by mouth daily.    . Cholecalciferol 1000 UNITS tablet Take 1,000 Units by mouth daily.    [name] levothyroxine (SYNTHROID, LEVOTHROID) 50 MCG tablet TAKE ONE TABLET BY MOUTH DAILY BEFORE BREAKFAST 90 tablet 3  . Multiple Vitamins-Minerals (CENTRUM SILVER PO) Take by mouth daily.    [name] omeprazole (PRILOSEC) 20 MG capsule TAKE ONE CAPSULE BY MOUTH DAILY 90 capsule 1  . simvastatin (ZOCOR) 10 MG tablet TAKE ONE TABLET BY MOUTH AT BEDTIME 90 tablet 1  . tamoxifen (NOLVADEX) 20 MG tablet TAKE ONE TABLET BY MOUTH DAILY 30 tablet 0  . valsartan-hydrochlorothiazide (DIOVAN-HCT) 160-25 MG tablet TAKE ONE TABLET BY MOUTH DAILY 90  tablet 1   No current facility-administered medications for this visit.     PHYSICAL EXAMINATION: ECOG PERFORMANCE STATUS: 1 - Symptomatic but completely ambulatory  Vitals:   09/02/16 0953  BP: (!) 153/52  Pulse: 96  Resp: 18  Temp: 97.7 F  (36.5 C)  SpO2: 100%   Filed Weights   09/02/16 0953  Weight: 175 lb (79.4 kg)    GENERAL:alert, no distress and comfortable SKIN: skin color, texture, turgor are normal, no rashes or significant lesions EYES: normal, Conjunctiva are pink and non-injected, sclera clear OROPHARYNX:no exudate, no erythema and lips, buccal mucosa, and tongue normal  NECK: supple, thyroid normal size, non-tender, without nodularity LYMPH:  no palpable lymphadenopathy in the cervical, axillary or inguinal LUNGS: clear to auscultation and percussion with normal breathing effort HEART: regular rate & rhythm and no murmurs and no lower extremity edema ABDOMEN:abdomen soft, non-tender and normal bowel sounds MUSCULOSKELETAL:no cyanosis of digits and no clubbing  NEURO: alert & oriented x 3 with fluent speech, no focal motor/sensory deficits EXTREMITIES: No lower extremity edema BREAST: No palpable masses or nodules in either right or left breasts. No palpable axillary supraclavicular or infraclavicular adenopathy no breast tenderness or nipple discharge. (exam performed in the presence of a chaperone)  LABORATORY DATA:  I have reviewed the data as listed   Chemistry      Component Value Date/Time   NA 142 04/23/2016 1054   NA 142 01/11/2014 0853   K 4.2 04/23/2016 1054   K 3.8 01/11/2014 0853   CL 103 04/23/2016 1054   CL 103 07/08/2012 1257   CO2 29 04/23/2016 1054   CO2 29 01/11/2014 0853   BUN 23 04/23/2016 1054   BUN 30.0 (H) 01/11/2014 0853   CREATININE 1.14 04/23/2016 1054   CREATININE 1.4 (H) 01/11/2014 0853      Component Value Date/Time   CALCIUM 9.7 04/23/2016 1054   CALCIUM 9.8 01/11/2014 0853   ALKPHOS 14 (L) 04/23/2016 1054   ALKPHOS 19 (L) 01/11/2014 0853   AST 24 04/23/2016 1054   AST 24 01/11/2014 0853   ALT 21 04/23/2016 1054   ALT 21 01/11/2014 0853   BILITOT 0.4 04/23/2016 1054   BILITOT 0.51 01/11/2014 0853       Lab Results  Component Value Date   WBC 7.7  04/23/2016   HGB 12.5 04/23/2016   HCT 37.3 04/23/2016   MCV 90.8 04/23/2016   PLT 90.0 (L) 04/23/2016   NEUTROABS 5.2 04/23/2016    ASSESSMENT & PLAN:  Primary cancer of upper outer quadrant of right female breast Right breast invasive ductal carcinoma T1b N0 M0 stage IA ER/PR positive HER-2 negative, status post lumpectomy and is currently on oral antiestrogen therapy with Aromasin 25 mg daily  Tamoxifen toxicities: 1 severe myalgias much improved 2. Diffuse muscle and bone pain (previously seen by orthopedics and spine surgeons, she has in fact undergone spine surgery, has bursae inflammations)  Her husband passed away.  Shes tolerating tamoxifen better than Aromasin  Breast cancer surveillance: 1. Breast exam 09/02/2016  is normal 2. Mammogram 03/12/2016: indeterminate calcifications within the lumpectomy site in the quadrant posterior depth 2.2 x 0.5 x 0.6 cm, biopsy 03/14/2016: Fat necrosis  Patient wishes to follow with her primary care physician. She does not want [place] come back next year for checkups. I instructed her to call us if she has any problems or concerns in the future. I sent a prescription for tamoxifen that she will continue until end of March.  I spent 15 minutes talking to the patient of which more than half was spent in counseling and coordination of care.  No orders of the defined types were placed in this encounter.  The patient has a good understanding of the overall plan. she agrees with it. she will call with any problems that may develop before the next visit here.   Rulon Eisenmenger, MD 09/02/16

## 2016-09-17 ENCOUNTER — Other Ambulatory Visit: Payer: Self-pay | Admitting: Family Medicine

## 2016-09-19 ENCOUNTER — Encounter: Payer: Self-pay | Admitting: Family Medicine

## 2016-09-24 DIAGNOSIS — M961 Postlaminectomy syndrome, not elsewhere classified: Secondary | ICD-10-CM | POA: Diagnosis not present

## 2016-09-24 DIAGNOSIS — M545 Low back pain: Secondary | ICD-10-CM | POA: Diagnosis not present

## 2016-10-23 ENCOUNTER — Ambulatory Visit: Payer: Medicare Other | Admitting: Family Medicine

## 2016-10-30 ENCOUNTER — Ambulatory Visit (INDEPENDENT_AMBULATORY_CARE_PROVIDER_SITE_OTHER): Payer: Medicare Other | Admitting: Family Medicine

## 2016-10-30 ENCOUNTER — Encounter: Payer: Self-pay | Admitting: Family Medicine

## 2016-10-30 VITALS — BP 132/68 | HR 76 | Temp 98.1°F | Resp 16 | Ht 61.0 in | Wt 173.0 lb

## 2016-10-30 DIAGNOSIS — E7800 Pure hypercholesterolemia, unspecified: Secondary | ICD-10-CM

## 2016-10-30 DIAGNOSIS — Z23 Encounter for immunization: Secondary | ICD-10-CM

## 2016-10-30 DIAGNOSIS — N183 Chronic kidney disease, stage 3 unspecified: Secondary | ICD-10-CM

## 2016-10-30 DIAGNOSIS — R7301 Impaired fasting glucose: Secondary | ICD-10-CM | POA: Diagnosis not present

## 2016-10-30 DIAGNOSIS — D696 Thrombocytopenia, unspecified: Secondary | ICD-10-CM

## 2016-10-30 DIAGNOSIS — I10 Essential (primary) hypertension: Secondary | ICD-10-CM | POA: Diagnosis not present

## 2016-10-30 LAB — CBC WITH DIFFERENTIAL/PLATELET
Basophils Absolute: 0.1 10*3/uL (ref 0.0–0.1)
Basophils Relative: 0.8 % (ref 0.0–3.0)
EOS PCT: 0.5 % (ref 0.0–5.0)
Eosinophils Absolute: 0 10*3/uL (ref 0.0–0.7)
HCT: 36.9 % (ref 36.0–46.0)
Hemoglobin: 12.1 g/dL (ref 12.0–15.0)
LYMPHS ABS: 1.1 10*3/uL (ref 0.7–4.0)
Lymphocytes Relative: 13.4 % (ref 12.0–46.0)
MCHC: 32.7 g/dL (ref 30.0–36.0)
MCV: 89.5 fl (ref 78.0–100.0)
MONO ABS: 0.6 10*3/uL (ref 0.1–1.0)
Monocytes Relative: 6.7 % (ref 3.0–12.0)
NEUTROS ABS: 6.6 10*3/uL (ref 1.4–7.7)
NEUTROS PCT: 78.6 % — AB (ref 43.0–77.0)
PLATELETS: 81 10*3/uL — AB (ref 150.0–400.0)
RBC: 4.12 Mil/uL (ref 3.87–5.11)
RDW: 12.6 % (ref 11.5–15.5)
WBC: 8.4 10*3/uL (ref 4.0–10.5)

## 2016-10-30 LAB — BASIC METABOLIC PANEL
BUN: 21 mg/dL (ref 6–23)
CALCIUM: 9.8 mg/dL (ref 8.4–10.5)
CHLORIDE: 103 meq/L (ref 96–112)
CO2: 27 mEq/L (ref 19–32)
CREATININE: 1.21 mg/dL — AB (ref 0.40–1.20)
GFR: 45.05 mL/min — AB (ref >60.00)
Glucose, Bld: 121 mg/dL — ABNORMAL HIGH (ref 70–99)
Potassium: 4.6 mEq/L (ref 3.5–5.1)
Sodium: 140 mEq/L (ref 135–145)

## 2016-10-30 LAB — HEMOGLOBIN A1C: HEMOGLOBIN A1C: 6.5 % (ref 4.6–6.5)

## 2016-10-30 NOTE — Addendum Note (Signed)
Addended by: Gordy Councilman on: 10/30/2016 09:03 AM   Modules accepted: Orders
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
## 2016-10-31 ENCOUNTER — Other Ambulatory Visit: Payer: Self-pay | Admitting: *Deleted

## 2016-10-31 ENCOUNTER — Other Ambulatory Visit: Payer: Self-pay | Admitting: Family Medicine

## 2016-10-31 MED ORDER — PIOGLITAZONE HCL 15 MG PO TABS: 15.0000 mg | ORAL_TABLET | Freq: Every day | ORAL | 5 refills | Status: DC

## 2016-12-18 ENCOUNTER — Other Ambulatory Visit: Payer: Self-pay | Admitting: Family Medicine

## 2017-01-09 DIAGNOSIS — M5136 Other intervertebral disc degeneration, lumbar region: Secondary | ICD-10-CM | POA: Diagnosis not present

## 2017-02-10 ENCOUNTER — Other Ambulatory Visit: Payer: Self-pay | Admitting: Hematology and Oncology

## 2017-02-10 DIAGNOSIS — Z853 Personal history of malignant neoplasm of breast: Secondary | ICD-10-CM

## 2017-03-18 ENCOUNTER — Other Ambulatory Visit: Payer: Self-pay | Admitting: Family Medicine

## 2017-03-20 ENCOUNTER — Other Ambulatory Visit: Payer: Self-pay

## 2017-03-20 MED ORDER — PIOGLITAZONE HCL 15 MG PO TABS: 15.0000 mg | ORAL_TABLET | Freq: Every day | ORAL | 0 refills | Status: DC

## 2017-03-24 ENCOUNTER — Ambulatory Visit
Admission: RE | Admit: 2017-03-24 | Discharge: 2017-03-24 | Disposition: A | Discharge status: Home / Self Care | Payer: Medicare Other | Source: Ambulatory Visit | Attending: Hematology and Oncology | Admitting: Hematology and Oncology

## 2017-03-24 DIAGNOSIS — Z853 Personal history of malignant neoplasm of breast: Secondary | ICD-10-CM

## 2017-03-24 DIAGNOSIS — R928 Other abnormal and inconclusive findings on diagnostic imaging of breast: Secondary | ICD-10-CM | POA: Diagnosis not present

## 2017-04-23 ENCOUNTER — Ambulatory Visit (INDEPENDENT_AMBULATORY_CARE_PROVIDER_SITE_OTHER): Payer: Medicare Other | Admitting: Family Medicine

## 2017-04-23 ENCOUNTER — Encounter: Payer: Self-pay | Admitting: Family Medicine

## 2017-04-23 VITALS — BP 121/70 | HR 75 | Temp 97.9°F | Resp 16 | Ht 61.5 in | Wt 173.1 lb

## 2017-04-23 DIAGNOSIS — E7800 Pure hypercholesterolemia, unspecified: Secondary | ICD-10-CM

## 2017-04-23 DIAGNOSIS — I10 Essential (primary) hypertension: Secondary | ICD-10-CM | POA: Diagnosis not present

## 2017-04-23 DIAGNOSIS — E119 Type 2 diabetes mellitus without complications: Secondary | ICD-10-CM

## 2017-04-23 DIAGNOSIS — Z0000 Encounter for general adult medical examination without abnormal findings: Secondary | ICD-10-CM | POA: Diagnosis not present

## 2017-04-23 LAB — CBC WITH DIFFERENTIAL/PLATELET
BASOS ABS: 0.1 10*3/uL (ref 0.0–0.1)
Basophils Relative: 0.8 % (ref 0.0–3.0)
Eosinophils Absolute: 0.1 10*3/uL (ref 0.0–0.7)
Eosinophils Relative: 0.9 % (ref 0.0–5.0)
HCT: 34.7 % — ABNORMAL LOW (ref 36.0–46.0)
Hemoglobin: 11.6 g/dL — ABNORMAL LOW (ref 12.0–15.0)
Lymphocytes Relative: 25.8 % (ref 12.0–46.0)
Lymphs Abs: 1.9 10*3/uL (ref 0.7–4.0)
MCHC: 33.5 g/dL (ref 30.0–36.0)
MCV: 89.9 fl (ref 78.0–100.0)
MONOS PCT: 8.2 % (ref 3.0–12.0)
Monocytes Absolute: 0.6 10*3/uL (ref 0.1–1.0)
Neutro Abs: 4.8 10*3/uL (ref 1.4–7.7)
Neutrophils Relative %: 64.3 % (ref 43.0–77.0)
Platelets: 85 10*3/uL — ABNORMAL LOW (ref 150.0–400.0)
RBC: 3.86 Mil/uL — AB (ref 3.87–5.11)
RDW: 13 % (ref 11.5–15.5)
WBC: 7.5 10*3/uL (ref 4.0–10.5)

## 2017-04-23 LAB — COMPREHENSIVE METABOLIC PANEL
ALT: 17 U/L (ref 0–35)
AST: 21 U/L (ref 0–37)
Albumin: 4.3 g/dL (ref 3.5–5.2)
Alkaline Phosphatase: 14 U/L — ABNORMAL LOW (ref 39–117)
BILIRUBIN TOTAL: 0.4 mg/dL (ref 0.2–1.2)
BUN: 25 mg/dL — ABNORMAL HIGH (ref 6–23)
CALCIUM: 9.5 mg/dL (ref 8.4–10.5)
CHLORIDE: 102 meq/L (ref 96–112)
CO2: 29 meq/L (ref 19–32)
Creatinine, Ser: 1.15 mg/dL (ref 0.40–1.20)
GFR: 47.72 mL/min — AB (ref >60.00)
Glucose, Bld: 100 mg/dL — ABNORMAL HIGH (ref 70–99)
Potassium: 4.1 mEq/L (ref 3.5–5.1)
Sodium: 141 mEq/L (ref 135–145)
Total Protein: 6.8 g/dL (ref 6.0–8.3)

## 2017-04-23 LAB — LIPID PANEL
CHOL/HDL RATIO: 4
Cholesterol: 153 mg/dL (ref 0–200)
HDL: 43.6 mg/dL (ref >39.00)
LDL Cholesterol: 86 mg/dL (ref 0–99)
NONHDL: 109.15
TRIGLYCERIDES: 116 mg/dL (ref 0.0–149.0)
VLDL: 23.2 mg/dL (ref 0.0–40.0)

## 2017-04-23 LAB — HEMOGLOBIN A1C: Hgb A1c MFr Bld: 6.1 % (ref 4.6–6.5)

## 2017-04-23 MED ORDER — ZOSTER VAC RECOMB ADJUVANTED 50 MCG/0.5ML IM SUSR: 0.5000 mL | Freq: Once | INTRAMUSCULAR | 1 refills | Status: AC

## 2017-04-23 NOTE — Patient Instructions (Signed)
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
## 2017-04-24 DIAGNOSIS — M160 Bilateral primary osteoarthritis of hip: Secondary | ICD-10-CM | POA: Diagnosis not present

## 2017-04-24 DIAGNOSIS — M1612 Unilateral primary osteoarthritis, left hip: Secondary | ICD-10-CM | POA: Diagnosis not present

## 2017-05-01 ENCOUNTER — Telehealth: Payer: Self-pay | Admitting: Family Medicine

## 2017-05-01 NOTE — Telephone Encounter (Signed)
Pt declined 2019 AWV. A lot going on riight now with death of sister. Will schedule for 2020 sometime later in the year

## 2017-05-15 DIAGNOSIS — M5136 Other intervertebral disc degeneration, lumbar region: Secondary | ICD-10-CM | POA: Diagnosis not present

## 2017-05-21 DIAGNOSIS — C44529 Squamous cell carcinoma of skin of other part of trunk: Secondary | ICD-10-CM | POA: Diagnosis not present

## 2017-05-22 ENCOUNTER — Ambulatory Visit (INDEPENDENT_AMBULATORY_CARE_PROVIDER_SITE_OTHER): Payer: Medicare Other | Admitting: Family Medicine

## 2017-05-22 ENCOUNTER — Encounter: Payer: Self-pay | Admitting: Family Medicine

## 2017-05-22 VITALS — BP 103/61 | HR 84 | Temp 98.3°F | Resp 16 | Ht 61.5 in | Wt 171.1 lb

## 2017-05-22 DIAGNOSIS — I10 Essential (primary) hypertension: Secondary | ICD-10-CM

## 2017-05-22 DIAGNOSIS — R252 Cramp and spasm: Secondary | ICD-10-CM | POA: Diagnosis not present

## 2017-05-22 LAB — BASIC METABOLIC PANEL
BUN: 25 mg/dL — ABNORMAL HIGH (ref 6–23)
CO2: 31 meq/L (ref 19–32)
CREATININE: 1.1 mg/dL (ref 0.40–1.20)
Calcium: 9.3 mg/dL (ref 8.4–10.5)
Chloride: 101 mEq/L (ref 96–112)
GFR: 50.22 mL/min — ABNORMAL LOW (ref >60.00)
Glucose, Bld: 111 mg/dL — ABNORMAL HIGH (ref 70–99)
Potassium: 4.1 mEq/L (ref 3.5–5.1)
Sodium: 139 mEq/L (ref 135–145)

## 2017-05-22 LAB — MAGNESIUM: Magnesium: 1.8 mg/dL (ref 1.5–2.5)

## 2017-05-22 NOTE — Patient Instructions (Signed)
Take your magnesium tab TWICE per day. Start vitamin B6--as instructed on the bottle.
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
## 2017-05-23 ENCOUNTER — Other Ambulatory Visit: Payer: Self-pay

## 2017-05-23 NOTE — Patient Outreach (Signed)
[facility]) Care Management  05/23/2017  Diane Lucas 05/16/1930 [id number]   Medication Adherence call to [name] patient is showing past due under [place] [employer].on Pioglitazone 15 mg spoke with patient she is no longer taking this medication doctor Mcgowen took her off she explain her test result came back great and does not  need to be taking this medication any longer.  [place] Management Direct Dial [phone]  Fax [phone] [email or web address]

## 2017-06-17 ENCOUNTER — Other Ambulatory Visit: Payer: Self-pay | Admitting: Family Medicine

## 2017-06-25 DIAGNOSIS — M7062 Trochanteric bursitis, left hip: Secondary | ICD-10-CM | POA: Diagnosis not present

## 2017-06-25 DIAGNOSIS — M545 Low back pain: Secondary | ICD-10-CM | POA: Diagnosis not present

## 2017-06-25 DIAGNOSIS — M7061 Trochanteric bursitis, right hip: Secondary | ICD-10-CM | POA: Diagnosis not present

## 2017-06-25 DIAGNOSIS — G894 Chronic pain syndrome: Secondary | ICD-10-CM | POA: Diagnosis not present

## 2017-07-07 ENCOUNTER — Encounter: Payer: Self-pay | Admitting: Family Medicine

## 2017-07-28 ENCOUNTER — Encounter: Payer: Self-pay | Admitting: Family Medicine

## 2017-07-28 ENCOUNTER — Encounter: Payer: Self-pay | Admitting: *Deleted

## 2017-07-28 ENCOUNTER — Ambulatory Visit (INDEPENDENT_AMBULATORY_CARE_PROVIDER_SITE_OTHER): Payer: Medicare Other | Admitting: Family Medicine

## 2017-07-28 VITALS — BP 97/62 | HR 80 | Temp 97.8°F | Resp 16 | Ht 61.5 in | Wt 177.4 lb

## 2017-07-28 DIAGNOSIS — E039 Hypothyroidism, unspecified: Secondary | ICD-10-CM

## 2017-07-28 DIAGNOSIS — E119 Type 2 diabetes mellitus without complications: Secondary | ICD-10-CM

## 2017-07-28 DIAGNOSIS — R252 Cramp and spasm: Secondary | ICD-10-CM

## 2017-07-28 DIAGNOSIS — I10 Essential (primary) hypertension: Secondary | ICD-10-CM | POA: Diagnosis not present

## 2017-07-28 DIAGNOSIS — E669 Obesity, unspecified: Secondary | ICD-10-CM

## 2017-07-28 LAB — TSH: TSH: 2.43 u[IU]/mL (ref 0.35–4.50)

## 2017-07-28 LAB — HEMOGLOBIN A1C: HEMOGLOBIN A1C: 6.1 % (ref 4.6–6.5)
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
## 2017-07-29 ENCOUNTER — Telehealth: Payer: Self-pay | Admitting: Hematology and Oncology

## 2017-07-29 NOTE — Telephone Encounter (Signed)
Patient called to cancel °

## 2017-07-31 ENCOUNTER — Other Ambulatory Visit: Payer: Self-pay | Admitting: Family Medicine

## 2017-07-31 DIAGNOSIS — C44729 Squamous cell carcinoma of skin of left lower limb, including hip: Secondary | ICD-10-CM | POA: Diagnosis not present

## 2017-07-31 DIAGNOSIS — L570 Actinic keratosis: Secondary | ICD-10-CM | POA: Diagnosis not present

## 2017-07-31 DIAGNOSIS — D485 Neoplasm of uncertain behavior of skin: Secondary | ICD-10-CM | POA: Diagnosis not present

## 2017-07-31 DIAGNOSIS — L821 Other seborrheic keratosis: Secondary | ICD-10-CM | POA: Diagnosis not present

## 2017-08-11 DIAGNOSIS — H527 Unspecified disorder of refraction: Secondary | ICD-10-CM | POA: Diagnosis not present

## 2017-08-11 DIAGNOSIS — H25813 Combined forms of age-related cataract, bilateral: Secondary | ICD-10-CM | POA: Diagnosis not present

## 2017-08-11 DIAGNOSIS — H43813 Vitreous degeneration, bilateral: Secondary | ICD-10-CM | POA: Diagnosis not present

## 2017-09-02 ENCOUNTER — Ambulatory Visit: Payer: Medicare Other | Admitting: Hematology and Oncology

## 2017-09-11 DIAGNOSIS — M5136 Other intervertebral disc degeneration, lumbar region: Secondary | ICD-10-CM | POA: Diagnosis not present

## 2017-09-11 DIAGNOSIS — M961 Postlaminectomy syndrome, not elsewhere classified: Secondary | ICD-10-CM | POA: Diagnosis not present

## 2017-09-16 ENCOUNTER — Other Ambulatory Visit: Payer: Self-pay | Admitting: Family Medicine

## 2017-09-23 DIAGNOSIS — H25813 Combined forms of age-related cataract, bilateral: Secondary | ICD-10-CM | POA: Diagnosis not present

## 2017-09-30 DIAGNOSIS — Z79899 Other long term (current) drug therapy: Secondary | ICD-10-CM | POA: Diagnosis not present

## 2017-09-30 DIAGNOSIS — K219 Gastro-esophageal reflux disease without esophagitis: Secondary | ICD-10-CM | POA: Diagnosis not present

## 2017-09-30 DIAGNOSIS — H43813 Vitreous degeneration, bilateral: Secondary | ICD-10-CM | POA: Diagnosis not present

## 2017-09-30 DIAGNOSIS — H527 Unspecified disorder of refraction: Secondary | ICD-10-CM | POA: Diagnosis not present

## 2017-09-30 DIAGNOSIS — E079 Disorder of thyroid, unspecified: Secondary | ICD-10-CM | POA: Diagnosis not present

## 2017-09-30 DIAGNOSIS — I10 Essential (primary) hypertension: Secondary | ICD-10-CM | POA: Diagnosis not present

## 2017-09-30 DIAGNOSIS — H25813 Combined forms of age-related cataract, bilateral: Secondary | ICD-10-CM | POA: Diagnosis not present

## 2017-09-30 DIAGNOSIS — Z886 Allergy status to analgesic agent status: Secondary | ICD-10-CM | POA: Diagnosis not present

## 2017-09-30 DIAGNOSIS — H25812 Combined forms of age-related cataract, left eye: Secondary | ICD-10-CM | POA: Diagnosis not present

## 2017-09-30 DIAGNOSIS — Z888 Allergy status to other drugs, medicaments and biological substances status: Secondary | ICD-10-CM | POA: Diagnosis not present

## 2017-09-30 DIAGNOSIS — Z853 Personal history of malignant neoplasm of breast: Secondary | ICD-10-CM | POA: Diagnosis not present

## 2017-09-30 DIAGNOSIS — E785 Hyperlipidemia, unspecified: Secondary | ICD-10-CM | POA: Diagnosis not present

## 2017-09-30 DIAGNOSIS — Z85828 Personal history of other malignant neoplasm of skin: Secondary | ICD-10-CM | POA: Diagnosis not present

## 2017-09-30 DIAGNOSIS — H25811 Combined forms of age-related cataract, right eye: Secondary | ICD-10-CM | POA: Diagnosis not present

## 2017-10-01 DIAGNOSIS — H527 Unspecified disorder of refraction: Secondary | ICD-10-CM | POA: Diagnosis not present

## 2017-10-01 DIAGNOSIS — H25811 Combined forms of age-related cataract, right eye: Secondary | ICD-10-CM | POA: Diagnosis not present

## 2017-10-01 DIAGNOSIS — Z961 Presence of intraocular lens: Secondary | ICD-10-CM | POA: Diagnosis not present

## 2017-10-01 DIAGNOSIS — H43813 Vitreous degeneration, bilateral: Secondary | ICD-10-CM | POA: Diagnosis not present

## 2017-10-07 DIAGNOSIS — D696 Thrombocytopenia, unspecified: Secondary | ICD-10-CM | POA: Diagnosis not present

## 2017-10-07 DIAGNOSIS — H527 Unspecified disorder of refraction: Secondary | ICD-10-CM | POA: Diagnosis not present

## 2017-10-07 DIAGNOSIS — Z85828 Personal history of other malignant neoplasm of skin: Secondary | ICD-10-CM | POA: Diagnosis not present

## 2017-10-07 DIAGNOSIS — H25813 Combined forms of age-related cataract, bilateral: Secondary | ICD-10-CM | POA: Diagnosis not present

## 2017-10-07 DIAGNOSIS — Z79899 Other long term (current) drug therapy: Secondary | ICD-10-CM | POA: Diagnosis not present

## 2017-10-07 DIAGNOSIS — H43813 Vitreous degeneration, bilateral: Secondary | ICD-10-CM | POA: Diagnosis not present

## 2017-10-07 DIAGNOSIS — I10 Essential (primary) hypertension: Secondary | ICD-10-CM | POA: Diagnosis not present

## 2017-10-07 DIAGNOSIS — Z823 Family history of stroke: Secondary | ICD-10-CM | POA: Diagnosis not present

## 2017-10-07 DIAGNOSIS — H25811 Combined forms of age-related cataract, right eye: Secondary | ICD-10-CM | POA: Diagnosis not present

## 2017-10-07 DIAGNOSIS — E785 Hyperlipidemia, unspecified: Secondary | ICD-10-CM | POA: Diagnosis not present

## 2017-10-07 DIAGNOSIS — K219 Gastro-esophageal reflux disease without esophagitis: Secondary | ICD-10-CM | POA: Diagnosis not present

## 2017-10-07 DIAGNOSIS — E079 Disorder of thyroid, unspecified: Secondary | ICD-10-CM | POA: Diagnosis not present

## 2017-10-08 DIAGNOSIS — Z961 Presence of intraocular lens: Secondary | ICD-10-CM | POA: Diagnosis not present

## 2017-10-08 DIAGNOSIS — H527 Unspecified disorder of refraction: Secondary | ICD-10-CM | POA: Diagnosis not present

## 2017-10-08 DIAGNOSIS — H43813 Vitreous degeneration, bilateral: Secondary | ICD-10-CM | POA: Diagnosis not present

## 2017-10-23 ENCOUNTER — Ambulatory Visit: Payer: Medicare Other | Admitting: Family Medicine

## 2017-10-28 ENCOUNTER — Other Ambulatory Visit: Payer: Self-pay | Admitting: Family Medicine

## 2017-10-30 ENCOUNTER — Ambulatory Visit (INDEPENDENT_AMBULATORY_CARE_PROVIDER_SITE_OTHER): Payer: Medicare Other | Admitting: Family Medicine

## 2017-10-30 ENCOUNTER — Encounter: Payer: Self-pay | Admitting: Family Medicine

## 2017-10-30 VITALS — BP 99/65 | HR 81 | Temp 97.7°F | Resp 16 | Ht 61.5 in | Wt 181.0 lb

## 2017-10-30 DIAGNOSIS — E669 Obesity, unspecified: Secondary | ICD-10-CM | POA: Diagnosis not present

## 2017-10-30 DIAGNOSIS — D696 Thrombocytopenia, unspecified: Secondary | ICD-10-CM

## 2017-10-30 DIAGNOSIS — E7800 Pure hypercholesterolemia, unspecified: Secondary | ICD-10-CM

## 2017-10-30 DIAGNOSIS — E039 Hypothyroidism, unspecified: Secondary | ICD-10-CM

## 2017-10-30 DIAGNOSIS — I10 Essential (primary) hypertension: Secondary | ICD-10-CM | POA: Diagnosis not present

## 2017-10-30 DIAGNOSIS — E119 Type 2 diabetes mellitus without complications: Secondary | ICD-10-CM | POA: Diagnosis not present

## 2017-10-30 LAB — LIPID PANEL
CHOL/HDL RATIO: 4
Cholesterol: 154 mg/dL (ref 0–200)
HDL: 43.1 mg/dL (ref >39.00)
LDL CALC: 77 mg/dL (ref 0–99)
NONHDL: 111.33
TRIGLYCERIDES: 171 mg/dL — AB (ref 0.0–149.0)
VLDL: 34.2 mg/dL (ref 0.0–40.0)

## 2017-10-30 LAB — CBC WITH DIFFERENTIAL/PLATELET
BASOS ABS: 0.1 10*3/uL (ref 0.0–0.1)
BASOS PCT: 1.3 % (ref 0.0–3.0)
EOS ABS: 0.1 10*3/uL (ref 0.0–0.7)
Eosinophils Relative: 1.2 % (ref 0.0–5.0)
HEMATOCRIT: 38.2 % (ref 36.0–46.0)
Hemoglobin: 12.6 g/dL (ref 12.0–15.0)
LYMPHS ABS: 2 10*3/uL (ref 0.7–4.0)
Lymphocytes Relative: 26.5 % (ref 12.0–46.0)
MCHC: 33 g/dL (ref 30.0–36.0)
MCV: 87.7 fl (ref 78.0–100.0)
Monocytes Absolute: 0.5 10*3/uL (ref 0.1–1.0)
Monocytes Relative: 7.1 % (ref 3.0–12.0)
NEUTROS ABS: 4.9 10*3/uL (ref 1.4–7.7)
NEUTROS PCT: 63.9 % (ref 43.0–77.0)
Platelets: 96 10*3/uL — ABNORMAL LOW (ref 150.0–400.0)
RBC: 4.35 Mil/uL (ref 3.87–5.11)
RDW: 13.3 % (ref 11.5–15.5)
WBC: 7.6 10*3/uL (ref 4.0–10.5)

## 2017-10-30 LAB — HEMOGLOBIN A1C: HEMOGLOBIN A1C: 6.4 % (ref 4.6–6.5)

## 2017-10-30 LAB — BASIC METABOLIC PANEL
BUN: 24 mg/dL — ABNORMAL HIGH (ref 6–23)
CALCIUM: 10 mg/dL (ref 8.4–10.5)
CO2: 29 mEq/L (ref 19–32)
Chloride: 101 mEq/L (ref 96–112)
Creatinine, Ser: 1.29 mg/dL — ABNORMAL HIGH (ref 0.40–1.20)
GFR: 41.74 mL/min — AB (ref >60.00)
Glucose, Bld: 115 mg/dL — ABNORMAL HIGH (ref 70–99)
POTASSIUM: 4.2 meq/L (ref 3.5–5.1)
SODIUM: 140 meq/L (ref 135–145)
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
## 2017-11-09 ENCOUNTER — Emergency Department (HOSPITAL_COMMUNITY): Payer: Medicare Other

## 2017-11-09 ENCOUNTER — Emergency Department (HOSPITAL_COMMUNITY)
Admission: EM | Admit: 2017-11-09 | Discharge: 2017-11-10 | Disposition: A | Discharge status: Home / Self Care | Payer: Medicare Other | Attending: Emergency Medicine | Admitting: Emergency Medicine

## 2017-11-09 ENCOUNTER — Encounter (HOSPITAL_COMMUNITY): Payer: Self-pay | Admitting: Emergency Medicine

## 2017-11-09 DIAGNOSIS — I129 Hypertensive chronic kidney disease with stage 1 through stage 4 chronic kidney disease, or unspecified chronic kidney disease: Secondary | ICD-10-CM | POA: Insufficient documentation

## 2017-11-09 DIAGNOSIS — I959 Hypotension, unspecified: Secondary | ICD-10-CM | POA: Diagnosis not present

## 2017-11-09 DIAGNOSIS — S0990XA Unspecified injury of head, initial encounter: Secondary | ICD-10-CM | POA: Diagnosis not present

## 2017-11-09 DIAGNOSIS — E119 Type 2 diabetes mellitus without complications: Secondary | ICD-10-CM | POA: Insufficient documentation

## 2017-11-09 DIAGNOSIS — S199XXA Unspecified injury of neck, initial encounter: Secondary | ICD-10-CM | POA: Diagnosis not present

## 2017-11-09 DIAGNOSIS — Y929 Unspecified place or not applicable: Secondary | ICD-10-CM | POA: Diagnosis not present

## 2017-11-09 DIAGNOSIS — I10 Essential (primary) hypertension: Secondary | ICD-10-CM | POA: Diagnosis not present

## 2017-11-09 DIAGNOSIS — Y9389 Activity, other specified: Secondary | ICD-10-CM | POA: Diagnosis not present

## 2017-11-09 DIAGNOSIS — S0083XA Contusion of other part of head, initial encounter: Secondary | ICD-10-CM | POA: Diagnosis not present

## 2017-11-09 DIAGNOSIS — S42201A Unspecified fracture of upper end of right humerus, initial encounter for closed fracture: Secondary | ICD-10-CM

## 2017-11-09 DIAGNOSIS — Z79899 Other long term (current) drug therapy: Secondary | ICD-10-CM | POA: Diagnosis not present

## 2017-11-09 DIAGNOSIS — R51 Headache: Secondary | ICD-10-CM | POA: Diagnosis not present

## 2017-11-09 DIAGNOSIS — S42211A Unspecified displaced fracture of surgical neck of right humerus, initial encounter for closed fracture: Secondary | ICD-10-CM | POA: Insufficient documentation

## 2017-11-09 DIAGNOSIS — S42291A Other displaced fracture of upper end of right humerus, initial encounter for closed fracture: Secondary | ICD-10-CM | POA: Diagnosis not present

## 2017-11-09 DIAGNOSIS — Y999 Unspecified external cause status: Secondary | ICD-10-CM | POA: Insufficient documentation

## 2017-11-09 DIAGNOSIS — M542 Cervicalgia: Secondary | ICD-10-CM | POA: Diagnosis not present

## 2017-11-09 DIAGNOSIS — W0110XA Fall on same level from slipping, tripping and stumbling with subsequent striking against unspecified object, initial encounter: Secondary | ICD-10-CM | POA: Insufficient documentation

## 2017-11-09 DIAGNOSIS — S02401A Maxillary fracture, unspecified, initial encounter for closed fracture: Secondary | ICD-10-CM

## 2017-11-09 DIAGNOSIS — N183 Chronic kidney disease, stage 3 (moderate): Secondary | ICD-10-CM | POA: Diagnosis not present

## 2017-11-09 DIAGNOSIS — E039 Hypothyroidism, unspecified: Secondary | ICD-10-CM | POA: Diagnosis not present

## 2017-11-09 DIAGNOSIS — M25519 Pain in unspecified shoulder: Secondary | ICD-10-CM | POA: Diagnosis not present

## 2017-11-09 DIAGNOSIS — W19XXXA Unspecified fall, initial encounter: Secondary | ICD-10-CM

## 2017-11-09 DIAGNOSIS — S42213A Unspecified displaced fracture of surgical neck of unspecified humerus, initial encounter for closed fracture: Secondary | ICD-10-CM

## 2017-11-09 DIAGNOSIS — R0902 Hypoxemia: Secondary | ICD-10-CM | POA: Diagnosis not present

## 2017-11-09 DIAGNOSIS — S0240DA Maxillary fracture, left side, initial encounter for closed fracture: Secondary | ICD-10-CM | POA: Diagnosis not present

## 2017-11-09 DIAGNOSIS — S299XXA Unspecified injury of thorax, initial encounter: Secondary | ICD-10-CM | POA: Diagnosis not present

## 2017-11-09 DIAGNOSIS — Z743 Need for continuous supervision: Secondary | ICD-10-CM | POA: Diagnosis not present

## 2017-11-09 DIAGNOSIS — M25522 Pain in left elbow: Secondary | ICD-10-CM | POA: Diagnosis not present

## 2017-11-09 HISTORY — DX: Maxillary fracture, unspecified side, initial encounter for closed fracture: S02.401A

## 2017-11-09 HISTORY — DX: Unspecified displaced fracture of surgical neck of unspecified humerus, initial encounter for closed fracture: S42.213A

## 2017-11-09 LAB — BASIC METABOLIC PANEL
ANION GAP: 12 (ref 5–15)
BUN: 27 mg/dL — ABNORMAL HIGH (ref 8–23)
CALCIUM: 9.7 mg/dL (ref 8.9–10.3)
CO2: 24 mmol/L (ref 22–32)
Chloride: 105 mmol/L (ref 98–111)
Creatinine, Ser: 1.33 mg/dL — ABNORMAL HIGH (ref 0.44–1.00)
GFR calc non Af Amer: 35 mL/min — ABNORMAL LOW (ref >60)
GFR, EST AFRICAN AMERICAN: 41 mL/min — AB (ref >60)
Glucose, Bld: 181 mg/dL — ABNORMAL HIGH (ref 70–99)
Potassium: 4.2 mmol/L (ref 3.5–5.1)
Sodium: 141 mmol/L (ref 135–145)

## 2017-11-09 LAB — CBC
HEMATOCRIT: 41.4 % (ref 36.0–46.0)
Hemoglobin: 13.1 g/dL (ref 12.0–15.0)
MCH: 28.6 pg (ref 26.0–34.0)
MCHC: 31.6 g/dL (ref 30.0–36.0)
MCV: 90.4 fL (ref 80.0–100.0)
NRBC: 0 % (ref 0.0–0.2)
PLATELETS: 98 10*3/uL — AB (ref 150–400)
RBC: 4.58 MIL/uL (ref 3.87–5.11)
RDW: 12.7 % (ref 11.5–15.5)
WBC: 17.3 10*3/uL — ABNORMAL HIGH (ref 4.0–10.5)

## 2017-11-09 MED ORDER — MORPHINE SULFATE (PF) 4 MG/ML IV SOLN
4.0000 mg | Freq: Once | INTRAVENOUS | Status: AC
  Administered 2017-11-09: 4 mg via INTRAVENOUS
  Filled 2017-11-09: qty 1
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
## 2017-11-09 NOTE — ED Triage Notes (Addendum)
Per EMS , pt. From home with complaint of fall at around 7pm this evening, unwitnessed. Pt. stated that she was in her living room doing stuff and bumped into a chair , lost her balance and fell, has abrasions on face and left hand . Denied LOC. A/o x4. Denied taking blood thinner. Pt. Complaint of right shoulder pain , no report of obvious deformity.received a total of 228mcg of IV fentanyl via EMS.

## 2017-11-09 NOTE — ED Notes (Signed)
Bed: WA12 Expected date:  Expected time:  Means of arrival:  Comments: 84 yo F/Fall 

## 2017-11-10 ENCOUNTER — Emergency Department (HOSPITAL_COMMUNITY): Payer: Medicare Other

## 2017-11-10 DIAGNOSIS — I8390 Asymptomatic varicose veins of unspecified lower extremity: Secondary | ICD-10-CM | POA: Diagnosis not present

## 2017-11-10 DIAGNOSIS — W19XXXD Unspecified fall, subsequent encounter: Secondary | ICD-10-CM | POA: Diagnosis not present

## 2017-11-10 DIAGNOSIS — Z79899 Other long term (current) drug therapy: Secondary | ICD-10-CM | POA: Diagnosis not present

## 2017-11-10 DIAGNOSIS — M6281 Muscle weakness (generalized): Secondary | ICD-10-CM | POA: Diagnosis not present

## 2017-11-10 DIAGNOSIS — G4730 Sleep apnea, unspecified: Secondary | ICD-10-CM | POA: Diagnosis not present

## 2017-11-10 DIAGNOSIS — E119 Type 2 diabetes mellitus without complications: Secondary | ICD-10-CM | POA: Diagnosis not present

## 2017-11-10 DIAGNOSIS — T84090D Other mechanical complication of internal right hip prosthesis, subsequent encounter: Secondary | ICD-10-CM | POA: Diagnosis not present

## 2017-11-10 DIAGNOSIS — M19011 Primary osteoarthritis, right shoulder: Secondary | ICD-10-CM | POA: Diagnosis not present

## 2017-11-10 DIAGNOSIS — D649 Anemia, unspecified: Secondary | ICD-10-CM | POA: Diagnosis not present

## 2017-11-10 DIAGNOSIS — Z96641 Presence of right artificial hip joint: Secondary | ICD-10-CM | POA: Diagnosis not present

## 2017-11-10 DIAGNOSIS — E039 Hypothyroidism, unspecified: Secondary | ICD-10-CM | POA: Diagnosis not present

## 2017-11-10 DIAGNOSIS — N183 Chronic kidney disease, stage 3 (moderate): Secondary | ICD-10-CM | POA: Diagnosis not present

## 2017-11-10 DIAGNOSIS — S42211A Unspecified displaced fracture of surgical neck of right humerus, initial encounter for closed fracture: Secondary | ICD-10-CM | POA: Diagnosis not present

## 2017-11-10 DIAGNOSIS — K219 Gastro-esophageal reflux disease without esophagitis: Secondary | ICD-10-CM | POA: Diagnosis not present

## 2017-11-10 DIAGNOSIS — D696 Thrombocytopenia, unspecified: Secondary | ICD-10-CM | POA: Diagnosis not present

## 2017-11-10 DIAGNOSIS — I129 Hypertensive chronic kidney disease with stage 1 through stage 4 chronic kidney disease, or unspecified chronic kidney disease: Secondary | ICD-10-CM | POA: Diagnosis not present

## 2017-11-10 DIAGNOSIS — M47816 Spondylosis without myelopathy or radiculopathy, lumbar region: Secondary | ICD-10-CM | POA: Diagnosis not present

## 2017-11-10 DIAGNOSIS — M810 Age-related osteoporosis without current pathological fracture: Secondary | ICD-10-CM | POA: Diagnosis not present

## 2017-11-10 DIAGNOSIS — S0083XA Contusion of other part of head, initial encounter: Secondary | ICD-10-CM | POA: Diagnosis not present

## 2017-11-10 DIAGNOSIS — M79601 Pain in right arm: Secondary | ICD-10-CM | POA: Diagnosis not present

## 2017-11-10 DIAGNOSIS — R2689 Other abnormalities of gait and mobility: Secondary | ICD-10-CM | POA: Diagnosis not present

## 2017-11-10 DIAGNOSIS — S299XXA Unspecified injury of thorax, initial encounter: Secondary | ICD-10-CM | POA: Diagnosis not present

## 2017-11-10 DIAGNOSIS — S42301D Unspecified fracture of shaft of humerus, right arm, subsequent encounter for fracture with routine healing: Secondary | ICD-10-CM | POA: Diagnosis not present

## 2017-11-10 DIAGNOSIS — M1990 Unspecified osteoarthritis, unspecified site: Secondary | ICD-10-CM | POA: Diagnosis not present

## 2017-11-10 DIAGNOSIS — E1122 Type 2 diabetes mellitus with diabetic chronic kidney disease: Secondary | ICD-10-CM | POA: Diagnosis not present

## 2017-11-10 DIAGNOSIS — R42 Dizziness and giddiness: Secondary | ICD-10-CM | POA: Diagnosis not present

## 2017-11-10 DIAGNOSIS — D72829 Elevated white blood cell count, unspecified: Secondary | ICD-10-CM | POA: Diagnosis not present

## 2017-11-10 DIAGNOSIS — E785 Hyperlipidemia, unspecified: Secondary | ICD-10-CM | POA: Diagnosis not present

## 2017-11-10 DIAGNOSIS — M542 Cervicalgia: Secondary | ICD-10-CM | POA: Diagnosis not present

## 2017-11-10 MED ORDER — PANTOPRAZOLE SODIUM 40 MG PO TBEC
40.0000 mg | DELAYED_RELEASE_TABLET | Freq: Every day | ORAL | Status: DC
  Administered 2017-11-10: 40 mg via ORAL
  Filled 2017-11-10: qty 1

## 2017-11-10 MED ORDER — IRBESARTAN 150 MG PO TABS
150.0000 mg | ORAL_TABLET | Freq: Every day | ORAL | Status: DC
  Administered 2017-11-10: 150 mg via ORAL
  Filled 2017-11-10 (×2): qty 1

## 2017-11-10 MED ORDER — OXYCODONE-ACETAMINOPHEN 5-325 MG PO TABS
1.0000 | ORAL_TABLET | Freq: Once | ORAL | Status: AC
  Administered 2017-11-10: 1 via ORAL
  Filled 2017-11-10: qty 1

## 2017-11-10 MED ORDER — LEVOTHYROXINE SODIUM 50 MCG PO TABS
50.0000 ug | ORAL_TABLET | Freq: Every day | ORAL | Status: DC
  Administered 2017-11-10: 50 ug via ORAL
  Filled 2017-11-10 (×2): qty 1

## 2017-11-10 MED ORDER — VALSARTAN-HYDROCHLOROTHIAZIDE 160-25 MG PO TABS: 1.0000 | ORAL_TABLET | Freq: Every day | ORAL | Status: DC

## 2017-11-10 MED ORDER — HYDROCHLOROTHIAZIDE 25 MG PO TABS
25.0000 mg | ORAL_TABLET | Freq: Every day | ORAL | Status: DC
  Administered 2017-11-10: 25 mg via ORAL
  Filled 2017-11-10 (×2): qty 1

## 2017-11-10 MED ORDER — OXYCODONE-ACETAMINOPHEN 5-325 MG PO TABS: 1.0000 | ORAL_TABLET | Freq: Four times a day (QID) | ORAL | 0 refills | Status: AC | PRN

## 2017-11-10 MED ORDER — OXYCODONE-ACETAMINOPHEN 5-325 MG PO TABS: 1.0000 | ORAL_TABLET | Freq: Four times a day (QID) | ORAL | Status: DC | PRN

## 2017-11-10 NOTE — Progress Notes (Signed)
CSW received a call back from [facility] who stated pt should be able to arrive tonight if pt can pay 3 days up front ($375 per night X 3 = $1,125) and pt will only have to pay the days up to the day of authorization.  For example: If pt is authorized by [facility] Medicare on 10/29 pt will only have to pay for one day and will be reimbursed 2 days.  Misty is en route to confrim this can happen and  CSW awaiting return call from [facility] now at ph: [phone].  CSW will continue to follow for D/C needs.  Alphonse Guild. Aarin Sparkman, LCSW, LCAS, CSI Clinical Social Worker Ph: [phone]

## 2017-11-10 NOTE — Progress Notes (Signed)
CSW followed up with PT to ensure that pt would be being seen today for further evaluation. CSW was informed that pt has been assigned to [facility] and will be seen shortly. CSW spoke with [facility] who expressed that they would review pt's information at this time.    Diane Lucas, MSW, [facility] Clinical Social Worker [phone]

## 2017-11-10 NOTE — Progress Notes (Addendum)
8:12am-CSW left voicemail for Alyse Low with [facility] asking that she call CSW back regarding pt . CSW awaits call back at this time.   CSW aware that pt is holding for placement CSW spoke with pt via phone to determine opt's wishes regarding placement. Pt expressed that pt would prefer to go home but knows that pt can not do the therapy at home therefore would like to go to rehab. Pt expressed that pt's top two choices are [facility] and [facility]. CSW has faxed information to these two facilities as well as others at this time.    CSW discussed with pt that pt would need to be seen by PT/OT and then CSW could update facility chosen by pt to began authorization for pt's placement. CSW also advised pt that [facility] can deny or approve pt and depending upon bed availability could impact where pt is placed. CSW verbalized to pt that CSW would work to get pt into facility of choice however CSW could not conform that pt would be placed in one of those two.   Plan is for pt to be seen by PT/OT and then CSW will present offers to pt via phone and have facility began authorization.   Virgie Dad Meeka Cartelli, MSW, [facility] Clinical Social Worker [phone]

## 2017-11-10 NOTE — ED Notes (Signed)
Pt concern related to wait time for transport to facility. Per SW waiting for authorization form from facility to insurance and SW will update staff/pt/family when complete. Pt/family aware of plan of care.

## 2017-11-10 NOTE — NC FL2 (Signed)
[place] MEDICAID FL2 LEVEL OF CARE SCREENING TOOL     IDENTIFICATION  Patient Name: Diane Lucas Birthdate: 03/07/1930 Sex: female Admission Date (Current Location): 11/09/2017  [facility] and [place] Number:  Herbalist and Address:  [facility] [place] [address], [place], [place] [address]      Provider Number: [id number]  Attending Physician Name and Address:  Default, Provider, MD  Relative Name and Phone Number:       Current Level of Care: Hospital Recommended Level of Care: [place] Prior Approval Number:    Date Approved/Denied:   PASRR Number:   [id number]   Discharge Plan: SNF    Current Diagnoses: Patient Active Problem List   Diagnosis Date Noted  . Hyperlipidemia 04/25/2015  . Chronic renal insufficiency, stage III (moderate) ([place]) 04/25/2015  . Thrombocytopenia ([place]) 01/11/2014  . Dyslipidemia 01/21/2013  . Obesity (BMI 30-39.9) 11/22/2012  . Varicose veins of both legs with edema 11/22/2012  . HTN (hypertension) 11/22/2012  . Leg pain, bilateral 11/22/2012  . Facial numbness 11/22/2012  . Primary cancer of upper outer quadrant of right female breast ([place]) 03/05/2012  . 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    Orientation RESPIRATION BLADDER Height & Weight     Self, Time, Situation, Place  Normal Continent Weight:   Height:     BEHAVIORAL SYMPTOMS/MOOD NEUROLOGICAL BOWEL NUTRITION STATUS      Continent Diet(please discharge summary.)  AMBULATORY STATUS COMMUNICATION OF NEEDS Skin   Extensive Assist Verbally Normal                       Personal Care Assistance Level of Assistance  Bathing, Feeding, Dressing Bathing Assistance: Maximum assistance Feeding assistance: Limited assistance Dressing Assistance: Maximum assistance     Functional Limitations Info  Sight, Hearing, Speech Sight Info: Adequate Hearing Info: Adequate Speech Info: Adequate    SPECIAL CARE FACTORS  FREQUENCY  PT (By licensed PT), OT (By licensed OT)     PT Frequency: 5 times a week. OT Frequency: 5 times a week.            Contractures Contractures Info: Not present    Additional Factors Info  Code Status, Allergies Code Status Info: Prior  Allergies Info: Amlodipine, Aspirin, Benazepril, Minocycline, Xarelto Rivaroxaban           Current Medications (11/10/2017):  This is the current hospital active medication list Current Facility-Administered Medications  Medication Dose Route Frequency Provider Last Rate Last Dose  . hydrochlorothiazide (HYDRODIURIL) tablet 25 mg  25 mg Oral Daily Daleen Bo, MD      . irbesartan (AVAPRO) tablet 150 mg  150 mg Oral Daily Daleen Bo, MD      . levothyroxine (SYNTHROID, LEVOTHROID) tablet 50 mcg  50 mcg Oral Q0600 Charlesetta Shanks, MD   50 mcg at 11/10/17 [phone]  . oxyCODONE-acetaminophen (PERCOCET/ROXICET) 5-325 MG per tablet 1-2 tablet  1-2 tablet Oral Q6H PRN Charlesetta Shanks, MD      . pantoprazole (PROTONIX) EC tablet 40 mg  40 mg Oral Daily Charlesetta Shanks, MD       Current Outpatient Medications  Medication Sig Dispense Refill  . acetaminophen (TYLENOL) 500 MG tablet Take 1,000 mg by mouth 2 (two) times daily as needed for mild pain, moderate pain or headache. Take 2 tablet twice a day     . BIOTIN PO Take 1 tablet by mouth daily.     . Calcium Carbonate-Vitamin  D (CALCIUM-VITAMIN D) 500-200 MG-UNIT per tablet Take 2 tablets by mouth daily.    . Cholecalciferol 1000 UNITS tablet Take 1,000 Units by mouth daily.    [name] levothyroxine (SYNTHROID, LEVOTHROID) 50 MCG tablet TAKE ONE TABLET BY MOUTH BEFORE BREAKFAST (Patient taking differently: Take 50 mcg by mouth daily before breakfast. ) 90 tablet 0  . Magnesium 250 MG TABS Take 250 mg by mouth 2 (two) times daily.     [name] MELATONIN PO Take 1 tablet by mouth at bedtime as needed (sleep).     [name] omeprazole (PRILOSEC) 20 MG capsule TAKE ONE CAPSULE BY MOUTH DAILY (Patient taking  differently: Take 20 mg by mouth daily. ) 90 capsule 1  . Pyridoxine HCl (VITAMIN B-6 PO) Take 1 tablet by mouth daily.     . simvastatin (ZOCOR) 10 MG tablet TAKE ONE TABLET BY MOUTH AT BEDTIME (Patient taking differently: Take 10 mg by mouth at bedtime. ) 90 tablet 1  . valsartan-hydrochlorothiazide (DIOVAN-HCT) 160-25 MG tablet TAKE ONE TABLET BY MOUTH DAILY (Patient taking differently: Take 1 tablet by mouth daily. ) 90 tablet 1     Discharge Medications: Please see discharge summary for a list of discharge medications.  Relevant Imaging Results:  Relevant Lab Results:   Additional Information [phone].  Wetzel Bjornstad, LCSWA

## 2017-11-10 NOTE — Discharge Instructions (Signed)
1.  The humerus fracture is treated with immobilization in a sling.  Patient will need pain control.  Short-term Percocet 1 to 2 tablets as prescribed.  Further pain management by accepting physician. 2.  Patient has a stable, nondisplaced maxillary sinus fracture.  She should sleep with head of bed at approximately 30 degrees.  Follow-up with ENT for recheck. 3.  Patient has had her routine daily medications administered on 10\28.  He does not need additional daily medications until tomorrow.  She should however continue to get her as needed Percocet.

## 2017-11-10 NOTE — ED Notes (Signed)
Bed: [facility] Expected date:  Expected time:  Means of arrival:  Comments: EMS 12

## 2017-11-10 NOTE — Progress Notes (Signed)
CSW received call from [facility] [phone] with [facility] and was informed that they are able to offer a bed on pt. CSW was informed that once PT note has been placed in the system and CSW sends it to facility, then [facility] can start authorization. CSW updated pt via phone of this at this time. CSW awaits PT note in system.   Diane Lucas Perris Tripathi, MSW, [facility] Clinical Social Worker [phone]

## 2017-11-10 NOTE — Progress Notes (Signed)
CSW has sent over PT note to [facility] at this time. CSW spoke with [facility] to give update that notes had been sent. Misty expressed thats he would have Christy at the facility began authorization for pt at this time. Christy or Misty to follow up with CSW once Josem Kaufmann has been received.   Virgie Dad Gurnoor Sloop, MSW, [facility] Clinical Social Worker [phone]

## 2017-11-10 NOTE — Progress Notes (Signed)
CSW received a request to speak to the pt's family. Pt is in a hallway bed and is aware that pt is awaiting an insurance auth in order to go to [facility] SN[facility] in [place] but would like to go to [facility] and is willing to private pay if pt I able to transport there tonight.  CSW called the admissons person at [facility] at ph: [phone] and left a HIPPA-compliant VM  CSW called [facility] at ph: [phone] and spoke to Byars who will attempt to contact [place] about pt private paying tonight and will call the CSW back.  CSW will continue to follow for D/C needs.  Alphonse Guild. Peony Barner, LCSW, LCAS, CSI Clinical Social Worker Ph: [phone]

## 2017-11-10 NOTE — ED Notes (Signed)
Patient family updated on the status for transition to the facility. He has been made aware that we are currently awaiting authorization.

## 2017-11-10 NOTE — Clinical Social Work Note (Signed)
Clinical Social Work Assessment  Patient Details  Name: Diane Lucas MRN: [id number] Date of Birth: 1930-08-06  Date of referral:  11/10/17               Reason for consult:  Facility Placement, Discharge Planning                Permission sought to share information with:  Family Supports Permission granted to share information::  Yes, Verbal Permission Granted  Name::     Estefanie Cornforth   Agency::  family  Relationship::  son   Contact Information:  Kaysee Hergert [phone]  Housing/Transportation Living arrangements for the past 2 months:  [place] of Information:  Patient Patient Interpreter Needed:  None Criminal Activity/Legal Involvement Pertinent to Current Situation/Hospitalization:  No - Comment as needed Significant Relationships:  Adult Children Lives with:  Self Do you feel safe going back to the place where you live?  Yes(after some rehab. ) Need for family participation in patient care:  Yes (Comment)  Care giving concerns:  CSW consulted as pt feels that pt is unable to take care of self at home at this time with shoulder being hurt.    Social Worker assessment / plan:  CSW spoke with pt via home as CSW is covering remotely. CSW was informed that pt is from home. Pt expressed that husband passed away two years ago. Pt expressed that pt keeps falling and has had mutiple falls within the last few months. Pt expressed the desire to go home, however pt also expressed that pt can not manage at home alone.   Pt expressed interest in [facility] as well as [facility]. CSW has sent information to these facilities as well as others at this time. Pt understanding that pt can not be accepted into a facility until PT as seen pt so that insurance auth can be started.   Employment status:  Retired Nurse, adult PT Recommendations:  Not assessed at this time [place] / Referral to community resources:  [place]  Patient/Family's Response to care:  Pt's response to care appeared to be understanding and agreeable to the plan of care at this time.   Patient/Family's Understanding of and Emotional Response to Diagnosis, Current Treatment, and Prognosis:  No further questions or concerns have been presented to CSW at this time.   Emotional Assessment Appearance:  Appears stated age Attitude/Demeanor/Rapport:  Engaged Affect (typically observed):  Accepting, Appropriate, Pleasant Orientation:  Oriented to Place, Oriented to Situation, Oriented to Self, Oriented to  Time Alcohol / Substance use:  Illicit Drugs Psych involvement (Current and /or in the community):  No (Comment)  Discharge Needs  Concerns to be addressed:  Care Coordination Readmission within the last 30 days:  No Current discharge risk:  Dependent with Mobility Barriers to Discharge:  [place], Bowers 11/10/2017, 8:21 AM

## 2017-11-10 NOTE — Evaluation (Signed)
Physical Therapy Evaluation Patient Details Name: Diane Lucas MRN: [id number] DOB: [birth year] Today's Date: 11/10/2017   History of Present Illness  Pt is an 84 year old female presenting to ED after fall at home and sustained a fracture of the right humeral neck  Clinical Impression  Pt admitted with above diagnosis. Pt currently with functional limitations due to the deficits listed below (see PT Problem List).  Pt will benefit from skilled PT to increase their independence and safety with mobility to allow discharge to the venue listed below.  Pt seen in ED. Pt requiring assist for appropriate positioning of R UE sling.  Maintained NWB R UE due to right humeral neck fx.  Pt lives at home alone and reports frequent falls.  Pt feels unable to care for herself upon d/c home at current time and agreeable to SNF for rehab.  Pt requiring at least min assist for all mobility and presents as HIGH fall risk.     Follow Up Recommendations SNF    Equipment Recommendations  None recommended by PT    Recommendations for Other Services       Precautions / Restrictions Precautions Precautions: Fall Required Braces or Orthoses: Sling Restrictions Other Position/Activity Restrictions: kept R UE NWB      Mobility  Bed Mobility Overal bed mobility: Needs Assistance Bed Mobility: Supine to Sit     Supine to sit: Min assist     General bed mobility comments: assist for trunk upright, increased time and effort  Transfers Overall transfer level: Needs assistance Equipment used: 1 person hand held assist Transfers: Sit to/from Stand Sit to Stand: Min assist         General transfer comment: assist to rise and steady, provided HHA for support  Ambulation/Gait Ambulation/Gait assistance: Min assist Gait Distance (Feet): 120 Feet Assistive device: 1 person hand held assist Gait Pattern/deviations: Step-through pattern;Decreased stride length     General Gait Details: pt with very  unsteady gait, provided HHA for more support, pt wearing sling for R UE (NWB), assist for steadying  Stairs            Wheelchair Mobility    Modified Rankin (Stroke Patients Only)       Balance Overall balance assessment: History of Falls;Needs assistance         Standing balance support: Single extremity supported Standing balance-Leahy Scale: Poor Standing balance comment: requires UE support                             Pertinent Vitals/Pain Pain Assessment: 0-10 Pain Score: 3  Pain Location: R upper arm/shoulder Pain Descriptors / Indicators: Sore Pain Intervention(s): Limited activity within patient's tolerance;Monitored during session;Repositioned    Home Living Family/patient expects to be discharged to:: Private residence Living Arrangements: Alone   Type of Home: House Home Access: Ramped entrance     Home Layout: One level Home Equipment: Cane - single point      Prior Function Level of Independence: Independent with assistive device(s)         Comments: uses SPC in community     Hand Dominance        Extremity/Trunk Assessment   Upper Extremity Assessment Upper Extremity Assessment: RUE deficits/detail RUE Deficits / Details: assisted pt with proper sling positioning, sling maintained thereafter    Lower Extremity Assessment Lower Extremity Assessment: Generalized weakness       Communication   Communication: No difficulties  Cognition Arousal/Alertness: Awake/alert Behavior During Therapy: WFL for tasks assessed/performed Overall Cognitive Status: Within Functional Limits for tasks assessed                                        General Comments      Exercises     Assessment/Plan    PT Assessment Patient needs continued PT services  PT Problem List Decreased strength;Decreased mobility;Decreased balance;Decreased knowledge of use of DME;Pain       PT Treatment Interventions DME  instruction;Therapeutic activities;Gait training;Therapeutic exercise;Patient/family education;Functional mobility training;Balance training    PT Goals (Current goals can be found in the Care Plan section)  Acute Rehab PT Goals PT Goal Formulation: With patient Time For Goal Achievement: 11/17/17 Potential to Achieve Goals: Good    Frequency Min 2X/week   Barriers to discharge        Co-evaluation               AM-PAC PT "6 Clicks" Daily Activity  Outcome Measure Difficulty turning over in bed (including adjusting bedclothes, sheets and blankets)?: Unable Difficulty moving from lying on back to sitting on the side of the bed? : Unable Difficulty sitting down on and standing up from a chair with arms (e.g., wheelchair, bedside commode, etc,.)?: Unable Help needed moving to and from a bed to chair (including a wheelchair)?: A Little Help needed walking in hospital room?: A Little Help needed climbing 3-5 steps with a railing? : A Lot 6 Click Score: 11    End of Session Equipment Utilized During Treatment: Gait belt Activity Tolerance: Patient tolerated treatment well Patient left: in bed;with call bell/phone within reach   PT Visit Diagnosis: Unsteadiness on feet (R26.81);History of falling (Z91.81)    Time: 8003-4917 PT Time Calculation (min) (ACUTE ONLY): 20 min   Charges:   PT Evaluation $PT Eval Low Complexity: [place], PT, DPT Acute Rehabilitation Services Office: [phone] Pager: [phone]  Trena Platt 11/10/2017, 12:07 PM

## 2017-11-10 NOTE — Progress Notes (Signed)
CSW received a call from: [facility] in admissions Pt has been accepted by: [facility] in [place] Number for report is: [phone] Pt's unit/room/bed number will be: 35 Accepting physician: SNF MD  Pt can arrive ASAP on 10/28  CSW will update RN and EDP.  Alphonse Guild. Cedric Denison, LCSW, LCAS, CSI Clinical Social Worker Ph: [phone]

## 2017-11-10 NOTE — ED Notes (Signed)
Pt ambulated to the restroom with assistance with an unsteady gait.

## 2017-11-12 DIAGNOSIS — S42301D Unspecified fracture of shaft of humerus, right arm, subsequent encounter for fracture with routine healing: Secondary | ICD-10-CM | POA: Diagnosis not present

## 2017-11-12 DIAGNOSIS — D72829 Elevated white blood cell count, unspecified: Secondary | ICD-10-CM | POA: Diagnosis not present

## 2017-11-13 DIAGNOSIS — D72829 Elevated white blood cell count, unspecified: Secondary | ICD-10-CM | POA: Diagnosis not present

## 2017-11-19 DIAGNOSIS — D649 Anemia, unspecified: Secondary | ICD-10-CM | POA: Diagnosis not present

## 2017-11-21 DIAGNOSIS — S42301D Unspecified fracture of shaft of humerus, right arm, subsequent encounter for fracture with routine healing: Secondary | ICD-10-CM | POA: Diagnosis not present

## 2017-11-21 DIAGNOSIS — M47816 Spondylosis without myelopathy or radiculopathy, lumbar region: Secondary | ICD-10-CM | POA: Diagnosis not present

## 2017-11-28 DIAGNOSIS — M542 Cervicalgia: Secondary | ICD-10-CM | POA: Diagnosis not present

## 2017-12-03 DIAGNOSIS — M19011 Primary osteoarthritis, right shoulder: Secondary | ICD-10-CM | POA: Diagnosis not present

## 2017-12-03 DIAGNOSIS — M79601 Pain in right arm: Secondary | ICD-10-CM | POA: Diagnosis not present

## 2017-12-05 DIAGNOSIS — S42301D Unspecified fracture of shaft of humerus, right arm, subsequent encounter for fracture with routine healing: Secondary | ICD-10-CM | POA: Diagnosis not present

## 2017-12-05 DIAGNOSIS — K219 Gastro-esophageal reflux disease without esophagitis: Secondary | ICD-10-CM | POA: Diagnosis not present

## 2017-12-15 ENCOUNTER — Encounter: Payer: Self-pay | Admitting: Family Medicine

## 2017-12-15 NOTE — Pre-Procedure Instructions (Signed)
Diane Lucas  12/15/2017      [facility], [place], [place] - [address] [place] [address] Phone: [phone] Fax: [phone]    Your procedure is scheduled on December 5th.  Report to [facility] Admitting at 1:00 P.M.  Call this number if you have problems the morning of surgery:  [phone]   Remember:  Do not eat or drink after midnight.     Take these medicines the morning of surgery with A SIP OF WATER   Tylenol - if needed  Levothyroxine (Synthroid)   Omeprazole (Prilosec)  7 days prior to surgery STOP taking any Aspirin(unless otherwise instructed by your surgeon), Aleve, Naproxen, Ibuprofen, Motrin, Advil, Goody's, BC's, all herbal medications, fish oil, and all vitamins     Do not wear jewelry, make-up or nail polish.  Do not wear lotions, powders, or perfumes, or deodorant.  Do not shave 48 hours prior to surgery.  Men may shave face and neck.  Do not bring valuables to the hospital.  [facility] is not responsible for any belongings or valuables.   [facility]- Preparing For Surgery  Before surgery, you can play an important role. Because skin is not sterile, your skin needs to be as free of germs as possible. You can reduce the number of germs on your skin by washing with CHG (chlorahexidine gluconate) Soap before surgery.  CHG is an antiseptic cleaner which kills germs and bonds with the skin to continue killing germs even after washing.    Oral Hygiene is also important to reduce your risk of infection.  Remember - BRUSH YOUR TEETH THE MORNING OF SURGERY WITH YOUR REGULAR TOOTHPASTE  Please do not use if you have an allergy to CHG or antibacterial soaps. If your skin becomes reddened/irritated stop using the CHG.  Do not shave (including legs and underarms) for at least 48 hours prior to first CHG shower. It is OK to shave your face.  Please follow these instructions carefully.   1. Shower  the NIGHT BEFORE SURGERY and the MORNING OF SURGERY with CHG.   2. If you chose to wash your hair, wash your hair first as usual with your normal shampoo.  3. After you shampoo, rinse your hair and body thoroughly to remove the shampoo.  4. Use CHG as you would any other liquid soap. You can apply CHG directly to the skin and wash gently with a scrungie or a clean washcloth.   5. Apply the CHG Soap to your body ONLY FROM THE NECK DOWN.  Do not use on open wounds or open sores. Avoid contact with your eyes, ears, mouth and genitals (private parts). Wash Face and genitals (private parts)  with your normal soap.  6. Wash thoroughly, paying special attention to the area where your surgery will be performed.  7. Thoroughly rinse your body with warm water from the neck down.  8. DO NOT shower/wash with your normal soap after using and rinsing off the CHG Soap.  9. Pat yourself dry with a CLEAN TOWEL.  10. Wear CLEAN PAJAMAS to bed the night before surgery, wear comfortable clothes the morning of surgery  11. Place CLEAN SHEETS on your bed the night of your first shower and DO NOT SLEEP WITH PETS.    Day of Surgery:  Do not apply any deodorants/lotions.  Please wear clean clothes to the hospital/surgery center.   Remember  to brush your teeth WITH YOUR REGULAR TOOTHPASTE.    Contacts, dentures or bridgework may not be worn into surgery.  Leave your suitcase in the car.  After surgery it may be brought to your room.  For patients admitted to the hospital, discharge time will be determined by your treatment team.  Patients discharged the day of surgery will not be allowed to drive home.   Please read over the following fact sheets that you were given. Coughing and Deep Breathing, MRSA Information and Surgical Site Infection Prevention

## 2017-12-16 ENCOUNTER — Other Ambulatory Visit: Payer: Self-pay

## 2017-12-16 ENCOUNTER — Encounter (HOSPITAL_COMMUNITY)
Admission: RE | Admit: 2017-12-16 | Discharge: 2017-12-16 | Disposition: A | Discharge status: Home / Self Care | Payer: Medicare Other | Source: Ambulatory Visit | Attending: Orthopedic Surgery | Admitting: Orthopedic Surgery

## 2017-12-16 ENCOUNTER — Encounter (HOSPITAL_COMMUNITY): Payer: Self-pay

## 2017-12-16 DIAGNOSIS — E785 Hyperlipidemia, unspecified: Secondary | ICD-10-CM | POA: Diagnosis not present

## 2017-12-16 DIAGNOSIS — E039 Hypothyroidism, unspecified: Secondary | ICD-10-CM | POA: Diagnosis not present

## 2017-12-16 DIAGNOSIS — M1990 Unspecified osteoarthritis, unspecified site: Secondary | ICD-10-CM | POA: Diagnosis not present

## 2017-12-16 DIAGNOSIS — Z79899 Other long term (current) drug therapy: Secondary | ICD-10-CM | POA: Diagnosis not present

## 2017-12-16 DIAGNOSIS — Z8249 Family history of ischemic heart disease and other diseases of the circulatory system: Secondary | ICD-10-CM | POA: Diagnosis not present

## 2017-12-16 DIAGNOSIS — Z853 Personal history of malignant neoplasm of breast: Secondary | ICD-10-CM | POA: Diagnosis not present

## 2017-12-16 DIAGNOSIS — M810 Age-related osteoporosis without current pathological fracture: Secondary | ICD-10-CM | POA: Diagnosis not present

## 2017-12-16 DIAGNOSIS — I129 Hypertensive chronic kidney disease with stage 1 through stage 4 chronic kidney disease, or unspecified chronic kidney disease: Secondary | ICD-10-CM | POA: Diagnosis not present

## 2017-12-16 DIAGNOSIS — I701 Atherosclerosis of renal artery: Secondary | ICD-10-CM | POA: Diagnosis not present

## 2017-12-16 DIAGNOSIS — G4730 Sleep apnea, unspecified: Secondary | ICD-10-CM | POA: Diagnosis not present

## 2017-12-16 DIAGNOSIS — N183 Chronic kidney disease, stage 3 (moderate): Secondary | ICD-10-CM | POA: Diagnosis not present

## 2017-12-16 DIAGNOSIS — S42241A 4-part fracture of surgical neck of right humerus, initial encounter for closed fracture: Secondary | ICD-10-CM | POA: Diagnosis not present

## 2017-12-16 DIAGNOSIS — Z01818 Encounter for other preprocedural examination: Secondary | ICD-10-CM | POA: Insufficient documentation

## 2017-12-16 DIAGNOSIS — K219 Gastro-esophageal reflux disease without esophagitis: Secondary | ICD-10-CM | POA: Diagnosis not present

## 2017-12-16 LAB — BASIC METABOLIC PANEL
ANION GAP: 11 (ref 5–15)
BUN: 28 mg/dL — ABNORMAL HIGH (ref 8–23)
CO2: 25 mmol/L (ref 22–32)
Calcium: 9.8 mg/dL (ref 8.9–10.3)
Chloride: 104 mmol/L (ref 98–111)
Creatinine, Ser: 1.16 mg/dL — ABNORMAL HIGH (ref 0.44–1.00)
GFR calc Af Amer: 50 mL/min — ABNORMAL LOW (ref >60)
GFR calc non Af Amer: 43 mL/min — ABNORMAL LOW (ref >60)
Glucose, Bld: 107 mg/dL — ABNORMAL HIGH (ref 70–99)
Potassium: 4 mmol/L (ref 3.5–5.1)
Sodium: 140 mmol/L (ref 135–145)

## 2017-12-16 LAB — CBC
HCT: 39.5 % (ref 36.0–46.0)
Hemoglobin: 11.8 g/dL — ABNORMAL LOW (ref 12.0–15.0)
MCH: 27.6 pg (ref 26.0–34.0)
MCHC: 29.9 g/dL — ABNORMAL LOW (ref 30.0–36.0)
MCV: 92.5 fL (ref 80.0–100.0)
PLATELETS: 137 10*3/uL — AB (ref 150–400)
RBC: 4.27 MIL/uL (ref 3.87–5.11)
RDW: 13.6 % (ref 11.5–15.5)
WBC: 9.1 10*3/uL (ref 4.0–10.5)
nRBC: 0 % (ref 0.0–0.2)

## 2017-12-16 LAB — SURGICAL PCR SCREEN
MRSA, PCR: NEGATIVE
Staphylococcus aureus: POSITIVE — AB

## 2017-12-16 NOTE — Progress Notes (Signed)
PCP: Dr. Anitra Lauth  DM: denies  SA: denies  Pt denies SOB, chest pain, cough, fever  Pt stated understanding of instructions given for DOS  Pt has history of right breast cancer (lumpectomy), right arm restricted.  EKG taken at pre-admission appt abnormal.  Ebony Hail notified and coming to talk with the patient.
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
## 2017-12-17 ENCOUNTER — Ambulatory Visit: Payer: Medicare Other | Admitting: Family Medicine

## 2017-12-17 MED ORDER — TRANEXAMIC ACID-NACL 1000-0.7 MG/100ML-% IV SOLN
1000.0000 mg | INTRAVENOUS | Status: AC
  Administered 2017-12-18: 1000 mg via INTRAVENOUS
  Filled 2017-12-17: qty 100

## 2017-12-17 NOTE — Anesthesia Preprocedure Evaluation (Addendum)
Anesthesia Evaluation  Patient identified by MRN, date of birth, ID band Patient awake    Reviewed: Allergy & Precautions, NPO status , Patient's Chart, lab work & pertinent test results  History of Anesthesia Complications Negative for: history of anesthetic complications  Airway Mallampati: II  TM Distance: >3 FB Neck ROM: Full    Dental no notable dental hx. (+) Dental Advisory Given   Pulmonary sleep apnea ,    Pulmonary exam normal        Cardiovascular hypertension, Pt. on medications Normal cardiovascular exam     Neuro/Psych negative neurological ROS  negative psych ROS   GI/Hepatic GERD  Medicated,  Endo/Other  Hypothyroidism   Renal/GU Renal disease     Musculoskeletal  (+) Arthritis ,   Abdominal   Peds  Hematology   Anesthesia Other Findings   Reproductive/Obstetrics                           Anesthesia Physical Anesthesia Plan  ASA: III  Anesthesia Plan: General   Post-op Pain Management:    Induction: Intravenous  PONV Risk Score and Plan: 3 and Treatment may vary due to age or medical condition, Dexamethasone and Ondansetron  Airway Management Planned: Oral ETT  Additional Equipment:   Intra-op Plan:   Post-operative Plan: Extubation in OR  Informed Consent: I have reviewed the patients History and Physical, chart, labs and discussed the procedure including the risks, benefits and alternatives for the proposed anesthesia with the patient or authorized representative who has indicated his/her understanding and acceptance.   Dental advisory given and History available from chart only  Plan Discussed with:   Anesthesia Plan Comments: (PAT note written 12/17/2017 by Myra Gianotti, PA-C. )      Anesthesia Quick Evaluation

## 2017-12-18 ENCOUNTER — Inpatient Hospital Stay (HOSPITAL_COMMUNITY)
Admission: RE | Admit: 2017-12-18 | Discharge: 2017-12-22 | DRG: 483 | Disposition: A | Discharge status: Skilled Nursing Facility | Payer: Medicare Other | Attending: Orthopedic Surgery | Admitting: Orthopedic Surgery

## 2017-12-18 ENCOUNTER — Encounter (HOSPITAL_COMMUNITY)
Admission: RE | Disposition: A | Discharge status: Skilled Nursing Facility | Payer: Self-pay | Source: Home / Self Care | Attending: Orthopedic Surgery

## 2017-12-18 ENCOUNTER — Encounter (HOSPITAL_COMMUNITY): Payer: Self-pay

## 2017-12-18 ENCOUNTER — Other Ambulatory Visit: Payer: Self-pay

## 2017-12-18 ENCOUNTER — Inpatient Hospital Stay (HOSPITAL_COMMUNITY): Payer: Medicare Other | Admitting: Certified Registered Nurse Anesthetist

## 2017-12-18 ENCOUNTER — Inpatient Hospital Stay (HOSPITAL_COMMUNITY): Payer: Medicare Other | Admitting: Vascular Surgery

## 2017-12-18 DIAGNOSIS — M1990 Unspecified osteoarthritis, unspecified site: Secondary | ICD-10-CM | POA: Diagnosis not present

## 2017-12-18 DIAGNOSIS — Z96611 Presence of right artificial shoulder joint: Secondary | ICD-10-CM

## 2017-12-18 DIAGNOSIS — I10 Essential (primary) hypertension: Secondary | ICD-10-CM | POA: Diagnosis not present

## 2017-12-18 DIAGNOSIS — Z79890 Hormone replacement therapy (postmenopausal): Secondary | ICD-10-CM

## 2017-12-18 DIAGNOSIS — M47816 Spondylosis without myelopathy or radiculopathy, lumbar region: Secondary | ICD-10-CM | POA: Diagnosis not present

## 2017-12-18 DIAGNOSIS — E039 Hypothyroidism, unspecified: Secondary | ICD-10-CM | POA: Diagnosis present

## 2017-12-18 DIAGNOSIS — I701 Atherosclerosis of renal artery: Secondary | ICD-10-CM | POA: Diagnosis present

## 2017-12-18 DIAGNOSIS — Z8249 Family history of ischemic heart disease and other diseases of the circulatory system: Secondary | ICD-10-CM | POA: Diagnosis not present

## 2017-12-18 DIAGNOSIS — E785 Hyperlipidemia, unspecified: Secondary | ICD-10-CM | POA: Diagnosis present

## 2017-12-18 DIAGNOSIS — Z79899 Other long term (current) drug therapy: Secondary | ICD-10-CM

## 2017-12-18 DIAGNOSIS — D696 Thrombocytopenia, unspecified: Secondary | ICD-10-CM | POA: Diagnosis not present

## 2017-12-18 DIAGNOSIS — K219 Gastro-esophageal reflux disease without esophagitis: Secondary | ICD-10-CM | POA: Diagnosis present

## 2017-12-18 DIAGNOSIS — I8390 Asymptomatic varicose veins of unspecified lower extremity: Secondary | ICD-10-CM | POA: Diagnosis not present

## 2017-12-18 DIAGNOSIS — Z90710 Acquired absence of both cervix and uterus: Secondary | ICD-10-CM

## 2017-12-18 DIAGNOSIS — N183 Chronic kidney disease, stage 3 (moderate): Secondary | ICD-10-CM | POA: Diagnosis present

## 2017-12-18 DIAGNOSIS — W19XXXA Unspecified fall, initial encounter: Secondary | ICD-10-CM | POA: Diagnosis present

## 2017-12-18 DIAGNOSIS — Z853 Personal history of malignant neoplasm of breast: Secondary | ICD-10-CM | POA: Diagnosis not present

## 2017-12-18 DIAGNOSIS — R42 Dizziness and giddiness: Secondary | ICD-10-CM | POA: Diagnosis not present

## 2017-12-18 DIAGNOSIS — W19XXXD Unspecified fall, subsequent encounter: Secondary | ICD-10-CM | POA: Diagnosis not present

## 2017-12-18 DIAGNOSIS — I129 Hypertensive chronic kidney disease with stage 1 through stage 4 chronic kidney disease, or unspecified chronic kidney disease: Secondary | ICD-10-CM | POA: Diagnosis not present

## 2017-12-18 DIAGNOSIS — G4730 Sleep apnea, unspecified: Secondary | ICD-10-CM | POA: Diagnosis present

## 2017-12-18 DIAGNOSIS — M810 Age-related osteoporosis without current pathological fracture: Secondary | ICD-10-CM | POA: Diagnosis not present

## 2017-12-18 DIAGNOSIS — G8918 Other acute postprocedural pain: Secondary | ICD-10-CM | POA: Diagnosis not present

## 2017-12-18 DIAGNOSIS — Z96641 Presence of right artificial hip joint: Secondary | ICD-10-CM | POA: Diagnosis not present

## 2017-12-18 DIAGNOSIS — M25511 Pain in right shoulder: Secondary | ICD-10-CM | POA: Diagnosis present

## 2017-12-18 DIAGNOSIS — S42241A 4-part fracture of surgical neck of right humerus, initial encounter for closed fracture: Secondary | ICD-10-CM | POA: Diagnosis not present

## 2017-12-18 DIAGNOSIS — S42201A Unspecified fracture of upper end of right humerus, initial encounter for closed fracture: Secondary | ICD-10-CM | POA: Diagnosis not present

## 2017-12-18 DIAGNOSIS — E1122 Type 2 diabetes mellitus with diabetic chronic kidney disease: Secondary | ICD-10-CM | POA: Diagnosis not present

## 2017-12-18 DIAGNOSIS — S4291XD Fracture of right shoulder girdle, part unspecified, subsequent encounter for fracture with routine healing: Secondary | ICD-10-CM | POA: Diagnosis not present

## 2017-12-18 HISTORY — PX: REVERSE SHOULDER ARTHROPLASTY: SHX5054

## 2017-12-18 LAB — GLUCOSE, CAPILLARY: Glucose-Capillary: 95 mg/dL (ref 70–99)

## 2017-12-18 SURGERY — ARTHROPLASTY, SHOULDER, TOTAL, REVERSE
Anesthesia: General | Site: Shoulder | Laterality: Right

## 2017-12-18 MED ORDER — ROCURONIUM BROMIDE 50 MG/5ML IV SOSY
PREFILLED_SYRINGE | INTRAVENOUS | Status: AC
  Filled 2017-12-18: qty 5

## 2017-12-18 MED ORDER — DEXAMETHASONE SODIUM PHOSPHATE 10 MG/ML IJ SOLN
INTRAMUSCULAR | Status: AC
  Filled 2017-12-18: qty 1

## 2017-12-18 MED ORDER — METOCLOPRAMIDE HCL 5 MG/ML IJ SOLN: 5.0000 mg | Freq: Three times a day (TID) | INTRAMUSCULAR | Status: DC | PRN

## 2017-12-18 MED ORDER — OXYCODONE HCL 5 MG PO TABS
5.0000 mg | ORAL_TABLET | ORAL | Status: DC | PRN
  Filled 2017-12-18: qty 1

## 2017-12-18 MED ORDER — MIDAZOLAM HCL 2 MG/2ML IJ SOLN
1.0000 mg | Freq: Once | INTRAMUSCULAR | Status: AC
  Administered 2017-12-18: 1 mg via INTRAVENOUS
  Filled 2017-12-18: qty 1

## 2017-12-18 MED ORDER — CHLORHEXIDINE GLUCONATE 4 % EX LIQD: 60.0000 mL | Freq: Once | CUTANEOUS | Status: DC

## 2017-12-18 MED ORDER — TRAMADOL HCL 50 MG PO TABS: 50.0000 mg | ORAL_TABLET | Freq: Four times a day (QID) | ORAL | Status: DC | PRN

## 2017-12-18 MED ORDER — CEFAZOLIN SODIUM-DEXTROSE 2-4 GM/100ML-% IV SOLN
2.0000 g | INTRAVENOUS | Status: AC
  Administered 2017-12-18: 2 g via INTRAVENOUS
  Filled 2017-12-18: qty 100

## 2017-12-18 MED ORDER — DOCUSATE SODIUM 100 MG PO CAPS
100.0000 mg | ORAL_CAPSULE | Freq: Two times a day (BID) | ORAL | Status: DC
  Administered 2017-12-19: 100 mg via ORAL
  Filled 2017-12-18 (×6): qty 1

## 2017-12-18 MED ORDER — ONDANSETRON HCL 4 MG/2ML IJ SOLN: 4.0000 mg | Freq: Four times a day (QID) | INTRAMUSCULAR | Status: DC | PRN

## 2017-12-18 MED ORDER — ACETAMINOPHEN 325 MG PO TABS
325.0000 mg | ORAL_TABLET | Freq: Four times a day (QID) | ORAL | Status: DC | PRN
  Administered 2017-12-19 – 2017-12-22 (×10): 650 mg via ORAL
  Filled 2017-12-18 (×10): qty 2

## 2017-12-18 MED ORDER — FENTANYL CITRATE (PF) 100 MCG/2ML IJ SOLN
50.0000 ug | Freq: Once | INTRAMUSCULAR | Status: AC
  Administered 2017-12-18: 50 ug via INTRAVENOUS
  Filled 2017-12-18: qty 1

## 2017-12-18 MED ORDER — PROPOFOL 10 MG/ML IV BOLUS
INTRAVENOUS | Status: DC | PRN
  Administered 2017-12-18: 140 mg via INTRAVENOUS

## 2017-12-18 MED ORDER — MENTHOL 3 MG MT LOZG: 1.0000 | LOZENGE | OROMUCOSAL | Status: DC | PRN

## 2017-12-18 MED ORDER — LACTATED RINGERS IV SOLN
INTRAVENOUS | Status: DC
  Administered 2017-12-19: 07:00:00 via INTRAVENOUS

## 2017-12-18 MED ORDER — PHENOL 1.4 % MT LIQD: 1.0000 | OROMUCOSAL | Status: DC | PRN

## 2017-12-18 MED ORDER — LEVOTHYROXINE SODIUM 50 MCG PO TABS
50.0000 ug | ORAL_TABLET | Freq: Every day | ORAL | Status: DC
  Administered 2017-12-19 – 2017-12-22 (×4): 50 ug via ORAL
  Filled 2017-12-18 (×7): qty 1

## 2017-12-18 MED ORDER — OXYCODONE HCL 5 MG PO TABS: 10.0000 mg | ORAL_TABLET | ORAL | Status: DC | PRN

## 2017-12-18 MED ORDER — SODIUM CHLORIDE 0.9 % IV SOLN
INTRAVENOUS | Status: DC | PRN
  Administered 2017-12-18: 10 ug/min via INTRAVENOUS

## 2017-12-18 MED ORDER — DIPHENHYDRAMINE HCL 12.5 MG/5ML PO ELIX: 12.5000 mg | ORAL_SOLUTION | ORAL | Status: DC | PRN

## 2017-12-18 MED ORDER — BUPIVACAINE LIPOSOME 1.3 % IJ SUSP
INTRAMUSCULAR | Status: DC | PRN
  Administered 2017-12-18: 10 mL via PERINEURAL

## 2017-12-18 MED ORDER — MIDAZOLAM HCL 2 MG/2ML IJ SOLN
INTRAMUSCULAR | Status: AC
  Administered 2017-12-18: 1 mg via INTRAVENOUS
  Filled 2017-12-18: qty 2

## 2017-12-18 MED ORDER — METOCLOPRAMIDE HCL 5 MG PO TABS: 5.0000 mg | ORAL_TABLET | Freq: Three times a day (TID) | ORAL | Status: DC | PRN

## 2017-12-18 MED ORDER — FENTANYL CITRATE (PF) 100 MCG/2ML IJ SOLN
INTRAMUSCULAR | Status: AC
  Administered 2017-12-18: 50 ug via INTRAVENOUS
  Filled 2017-12-18: qty 2

## 2017-12-18 MED ORDER — ONDANSETRON HCL 4 MG PO TABS: 4.0000 mg | ORAL_TABLET | Freq: Four times a day (QID) | ORAL | Status: DC | PRN

## 2017-12-18 MED ORDER — HYDROMORPHONE HCL 1 MG/ML IJ SOLN: 0.5000 mg | INTRAMUSCULAR | Status: DC | PRN

## 2017-12-18 MED ORDER — LACTATED RINGERS IV SOLN
INTRAVENOUS | Status: DC | PRN
  Administered 2017-12-18: 14:00:00 via INTRAVENOUS

## 2017-12-18 MED ORDER — ONDANSETRON HCL 4 MG/2ML IJ SOLN
INTRAMUSCULAR | Status: AC
  Filled 2017-12-18: qty 2

## 2017-12-18 MED ORDER — BUPIVACAINE-EPINEPHRINE (PF) 0.5% -1:200000 IJ SOLN
INTRAMUSCULAR | Status: DC | PRN
  Administered 2017-12-18: 13 mL via PERINEURAL

## 2017-12-18 MED ORDER — PHENYLEPHRINE 40 MCG/ML (10ML) SYRINGE FOR IV PUSH (FOR BLOOD PRESSURE SUPPORT)
PREFILLED_SYRINGE | INTRAVENOUS | Status: AC
  Filled 2017-12-18: qty 10

## 2017-12-18 MED ORDER — FENTANYL CITRATE (PF) 250 MCG/5ML IJ SOLN
INTRAMUSCULAR | Status: AC
  Filled 2017-12-18: qty 5

## 2017-12-18 MED ORDER — ROCURONIUM BROMIDE 50 MG/5ML IV SOSY
PREFILLED_SYRINGE | INTRAVENOUS | Status: DC | PRN
  Administered 2017-12-18: 50 mg via INTRAVENOUS

## 2017-12-18 MED ORDER — PANTOPRAZOLE SODIUM 40 MG PO TBEC
40.0000 mg | DELAYED_RELEASE_TABLET | Freq: Every day | ORAL | Status: DC
  Administered 2017-12-19 – 2017-12-22 (×4): 40 mg via ORAL
  Filled 2017-12-18 (×4): qty 1

## 2017-12-18 MED ORDER — ONDANSETRON HCL 4 MG/2ML IJ SOLN
INTRAMUSCULAR | Status: DC | PRN
  Administered 2017-12-18: 4 mg via INTRAVENOUS

## 2017-12-18 MED ORDER — PROPOFOL 10 MG/ML IV BOLUS
INTRAVENOUS | Status: AC
  Filled 2017-12-18: qty 20

## 2017-12-18 MED ORDER — DEXAMETHASONE SODIUM PHOSPHATE 10 MG/ML IJ SOLN
INTRAMUSCULAR | Status: DC | PRN
  Administered 2017-12-18: 4 mg via INTRAVENOUS

## 2017-12-18 MED ORDER — FENTANYL CITRATE (PF) 100 MCG/2ML IJ SOLN
INTRAMUSCULAR | Status: DC | PRN
  Administered 2017-12-18: 25 ug via INTRAVENOUS
  Administered 2017-12-18 (×2): 75 ug via INTRAVENOUS

## 2017-12-18 MED ORDER — 0.9 % SODIUM CHLORIDE (POUR BTL) OPTIME
TOPICAL | Status: DC | PRN
  Administered 2017-12-18: 1000 mL

## 2017-12-18 MED ORDER — HYDROCHLOROTHIAZIDE 25 MG PO TABS
25.0000 mg | ORAL_TABLET | Freq: Every day | ORAL | Status: DC
  Administered 2017-12-19 – 2017-12-22 (×3): 25 mg via ORAL
  Filled 2017-12-18 (×4): qty 1

## 2017-12-18 MED ORDER — BISACODYL 5 MG PO TBEC: 5.0000 mg | DELAYED_RELEASE_TABLET | Freq: Every day | ORAL | Status: DC | PRN

## 2017-12-18 MED ORDER — POLYETHYLENE GLYCOL 3350 17 G PO PACK: 17.0000 g | PACK | Freq: Every day | ORAL | Status: DC | PRN

## 2017-12-18 MED ORDER — MAGNESIUM CITRATE PO SOLN: 1.0000 | Freq: Once | ORAL | Status: DC | PRN

## 2017-12-18 MED ORDER — VALSARTAN-HYDROCHLOROTHIAZIDE 160-25 MG PO TABS: 1.0000 | ORAL_TABLET | Freq: Every day | ORAL | Status: DC

## 2017-12-18 MED ORDER — EPHEDRINE SULFATE 50 MG/ML IJ SOLN
INTRAMUSCULAR | Status: DC | PRN
  Administered 2017-12-18: 5 mg via INTRAVENOUS

## 2017-12-18 MED ORDER — MIDAZOLAM HCL 2 MG/2ML IJ SOLN
INTRAMUSCULAR | Status: AC
  Filled 2017-12-18: qty 2

## 2017-12-18 MED ORDER — IRBESARTAN 150 MG PO TABS
150.0000 mg | ORAL_TABLET | Freq: Every day | ORAL | Status: DC
  Administered 2017-12-19 – 2017-12-22 (×3): 150 mg via ORAL
  Filled 2017-12-18 (×4): qty 1

## 2017-12-18 MED ORDER — LACTATED RINGERS IV SOLN
INTRAVENOUS | Status: DC
  Administered 2017-12-18: 14:00:00 via INTRAVENOUS

## 2017-12-18 SURGICAL SUPPLY — 71 items
ADH SKN CLS APL DERMABOND .7 (GAUZE/BANDAGES/DRESSINGS) ×1
ADH SKN CLS LQ APL DERMABOND (GAUZE/BANDAGES/DRESSINGS) ×1
AID PSTN UNV HD RSTRNT DISP (MISCELLANEOUS) ×1
ASMB CBL PN 914X1.8 STRL (Trauma Fixation) ×1 IMPLANT
BLADE SAW SGTL 83.5X18.5 (BLADE) ×3 IMPLANT
BSPLAT GLND +2X24 MDLR (Joint) ×1 IMPLANT
CABLE READY CERCLAGE W/CRIP (Trauma Fixation) ×2 IMPLANT
CEMENT HV SMART SET (Cement) ×2 IMPLANT
COVER SURGICAL LIGHT HANDLE (MISCELLANEOUS) ×3 IMPLANT
COVER WAND RF STERILE (DRAPES) ×3 IMPLANT
CUP SUT UNIV REVERS 36 NEUTRAL (Cup) ×2 IMPLANT
DERMABOND ADHESIVE PROPEN (GAUZE/BANDAGES/DRESSINGS) ×2
DERMABOND ADVANCED (GAUZE/BANDAGES/DRESSINGS) ×2
DERMABOND ADVANCED .7 DNX12 (GAUZE/BANDAGES/DRESSINGS) ×1 IMPLANT
DERMABOND ADVANCED .7 DNX6 (GAUZE/BANDAGES/DRESSINGS) IMPLANT
DRAPE ORTHO SPLIT 77X108 STRL (DRAPES) ×6
DRAPE SURG 17X11 SM STRL (DRAPES) ×3 IMPLANT
DRAPE SURG ORHT 6 SPLT 77X108 (DRAPES) ×2 IMPLANT
DRAPE U-SHAPE 47X51 STRL (DRAPES) ×3 IMPLANT
DRESSING AQUACEL AG SP 3.5X10 (GAUZE/BANDAGES/DRESSINGS) IMPLANT
DRSG AQUACEL AG ADV 3.5X10 (GAUZE/BANDAGES/DRESSINGS) ×3 IMPLANT
DRSG AQUACEL AG SP 3.5X10 (GAUZE/BANDAGES/DRESSINGS) ×3
DURAPREP 26ML APPLICATOR (WOUND CARE) ×3 IMPLANT
ELECT BLADE 4.0 EZ CLEAN MEGAD (MISCELLANEOUS) ×3
ELECT CAUTERY BLADE 6.4 (BLADE) ×3 IMPLANT
ELECT REM PT RETURN 9FT ADLT (ELECTROSURGICAL) ×3
ELECTRODE BLDE 4.0 EZ CLN MEGD (MISCELLANEOUS) ×1 IMPLANT
ELECTRODE REM PT RTRN 9FT ADLT (ELECTROSURGICAL) ×1 IMPLANT
FACESHIELD WRAPAROUND (MASK) ×9 IMPLANT
FACESHIELD WRAPAROUND OR TEAM (MASK) ×3 IMPLANT
GLENOID UNI REV MOD 24 +2 LAT (Joint) ×2 IMPLANT
GLENOSPHERE 36 +4 LAT/24 (Joint) ×2 IMPLANT
GLOVE BIO SURGEON STRL SZ 6.5 (GLOVE) ×1 IMPLANT
GLOVE BIO SURGEON STRL SZ7.5 (GLOVE) ×3 IMPLANT
GLOVE BIO SURGEON STRL SZ8 (GLOVE) ×3 IMPLANT
GLOVE BIO SURGEONS STRL SZ 6.5 (GLOVE) ×1
GLOVE BIOGEL M 7.0 STRL (GLOVE) ×2 IMPLANT
GLOVE EUDERMIC 7 POWDERFREE (GLOVE) ×3 IMPLANT
GLOVE SS BIOGEL STRL SZ 7.5 (GLOVE) ×1 IMPLANT
GLOVE SUPERSENSE BIOGEL SZ 7.5 (GLOVE) ×2
GOWN STRL REUS W/ TWL LRG LVL3 (GOWN DISPOSABLE) ×1 IMPLANT
GOWN STRL REUS W/ TWL XL LVL3 (GOWN DISPOSABLE) ×2 IMPLANT
GOWN STRL REUS W/TWL LRG LVL3 (GOWN DISPOSABLE) ×9
GOWN STRL REUS W/TWL XL LVL3 (GOWN DISPOSABLE) ×6
INSERT HUMERAL 36 +6 (Shoulder) ×2 IMPLANT
KIT BASIN OR (CUSTOM PROCEDURE TRAY) ×3 IMPLANT
KIT TURNOVER KIT B (KITS) ×3 IMPLANT
MANIFOLD NEPTUNE II (INSTRUMENTS) ×3 IMPLANT
NDL TAPERED W/ NITINOL LOOP (MISCELLANEOUS) ×1 IMPLANT
NEEDLE TAPERED W/ NITINOL LOOP (MISCELLANEOUS) ×3 IMPLANT
NS IRRIG 1000ML POUR BTL (IV SOLUTION) ×3 IMPLANT
PACK SHOULDER (CUSTOM PROCEDURE TRAY) ×3 IMPLANT
PAD ARMBOARD 7.5X6 YLW CONV (MISCELLANEOUS) ×6 IMPLANT
RESTRAINT HEAD UNIVERSAL NS (MISCELLANEOUS) ×3 IMPLANT
RESTRICTOR CEMENT PE SZ 2 (Cement) ×2 IMPLANT
SCREW CENTRAL MOD 30MM (Screw) ×2 IMPLANT
SCREW PERI LOCK 5.5X16 (Screw) ×4 IMPLANT
SCREW PERI LOCK 5.5X24 (Screw) ×2 IMPLANT
SLING ARM FOAM STRAP LRG (SOFTGOODS) IMPLANT
SPONGE LAP 18X18 X RAY DECT (DISPOSABLE) ×3 IMPLANT
SPONGE LAP 4X18 RFD (DISPOSABLE) ×3 IMPLANT
STEM HUMERAL MOD SZ 5 135 DEG (Stem) ×2 IMPLANT
STEM HUMERAL UNI REVERS SZ6 (Stem) ×2 IMPLANT
SUT MNCRL AB 3-0 PS2 18 (SUTURE) ×3 IMPLANT
SUT MON AB 2-0 CT1 27 (SUTURE) ×3 IMPLANT
SUT VIC AB 1 CT1 27 (SUTURE) ×3
SUT VIC AB 1 CT1 27XBRD ANBCTR (SUTURE) ×1 IMPLANT
SUTURE TAPE 1.3 40 TPR END (SUTURE) ×1 IMPLANT
SUTURETAPE 1.3 40 TPR END (SUTURE) ×3
TOWEL OR 17X26 10 PK STRL BLUE (TOWEL DISPOSABLE) ×3 IMPLANT
WATER STERILE IRR 1000ML POUR (IV SOLUTION) ×3 IMPLANT

## 2017-12-18 NOTE — Anesthesia Postprocedure Evaluation (Signed)
Anesthesia Post Note  Patient: Diane Lucas  Procedure(s) Performed: REVERSE SHOULDER ARTHROPLASTY (Right Shoulder)     Patient location during evaluation: PACU Anesthesia Type: General Level of consciousness: sedated Pain management: pain level controlled Vital Signs Assessment: post-procedure vital signs reviewed and stable Respiratory status: spontaneous breathing and respiratory function stable Cardiovascular status: stable Postop Assessment: no apparent nausea or vomiting Anesthetic complications: no    Last Vitals:  Vitals:   12/18/17 1904 12/18/17 2040  BP: 132/68 (!) 123/49  Pulse: 70 86  Resp:    Temp: (!) 36.3 C (!) 36.4 C  SpO2: 97% 96%    Last Pain:  Vitals:   12/18/17 2040  TempSrc: Oral  PainSc:                  [name] DANIEL

## 2017-12-18 NOTE — Progress Notes (Signed)
1850 Received pt from PACU, A&O x4. Right shoulder incision with Aquacel dressing dry and intact. Ice pack in place. Right arm is numb due to nerve block. Denies pain at this time.
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
## 2017-12-18 NOTE — Op Note (Signed)
12/18/2017  5:27 PM  PATIENT:   Diane Lucas  84 y.o. female  PRE-OPERATIVE DIAGNOSIS:  right shoulder 4-part proximal humerus fracture  POST-OPERATIVE DIAGNOSIS: Same  PROCEDURE: Right shoulder reverse arthroplasty utilizing a cemented size 5.5 Arthrex stem, +6 polyethylene insert, small MGS baseplate, 36/+4 glenosphere  SURGEON:  Dian Minahan, Metta Clines M.D.  ASSISTANTS: Jenetta Loges, PA-C  ANESTHESIA:   General endotracheal as well as interscalene block with Exparel  EBL: 200 cc  SPECIMEN: None  Drains: None   PATIENT DISPOSITION:  PACU - hemodynamically stable.    PLAN OF CARE: Admit for overnight observation  Brief history:  Patient is an 84 year old female who almost 2 months ago fell sustaining what was initially found to be a nondisplaced proximal humeral fracture.  She subsequently followed up in our office and repeat x-ray showed that there have been complete loss of overall alignment with collapse of the humeral head and displacement of both greater and lesser tuberosities.  Her presentation was a displaced four-part proximal humeral fracture.  She was reporting continued significant pain as well as functional mutations and given the degree of displacement and deformity in her level of pain and debility we discussed possible reverse shoulder arthroplasty as well as the potential risks versus benefits there.  Possible surgical complications were reviewed at length including the potential for bleeding, infection, neurovascular injury, persistent pain, loss of motion, failure of implant, anesthetic complication, and possible need for additional surgery.  She understands and accepts and agrees with her planned procedure.  Procedure detail:  After undergoing routine preop evaluation patient received prophylactic antibiotics and an interscalene block with Exparel was established in the holding area by the anesthesia department.  Placed supine on the operative able and underwent the  smooth induction of a general endotracheal anesthesia.  Placed into the beachchair position and appropriately padded and protected.  The right shoulder girdle region was sterilely prepped and draped in standard fashion.  Timeout was called.  An anterior deltopectoral approach to the right shoulder is made through 10 cm incision.  Skin flaps were elevated dissection carried deeply deltopectoral interval was then opened from proximal to distal the vein taken laterally pectoralis major tendon was tenotomized superiorly approxi-1.5 cm.  Conjoined tendon was mobilized and retracted medially.  The long head biceps tendon was then tenotomized and then moved proximally allowing identification of the displaced greater and lesser tuberosity segments.  The tuberosities were mobilized and the degree of deformity necessitated the bulk of the tuberosities to be excised along with the articular segment.  Once all the bone fragments were removed we then turned our attention to the glenoid where we performed a circumferential labral resection gain complete visualization the periphery of the glenoid and a guidepin was then placed into the center of the glenoid with an approximate 10 degree inferior tilt of the repair of the glenoid with our central followed by peripheral reamers central drill hole was placed we then assembled the final glenoid baseplate MGS system with a 30 mm central lag screw and the baseplate was then inserted with excellent fit and fixation.  Additional locking screws were placed peripherally well with good fixation.  The 36/+4 glenosphere was then inserted on the baseplate impacted and locked into position with excellent fixation.  We then returned our attention back to the humeral shaft and performed canal preparation and reaming and broaching.  We initially anticipated using a press-fit size 6 but as this was being seated we noted a split in the  calcar.  With this finding and the overall bony anatomy we  elected to place a cerclage wire proximally and then downsize her stem to a 5.5.  The cerclage wire was passed and tightened we resend letter implant to a 5.5 stem and confirmed that this would see completely.  A distal cement plug was then placed canal was cleaned and dried cement was mixed introduced into the canal in retrograde fashion and pressurized and our size 5.5 stem was then inserted that the native retroversion of approxi-20 degrees and all extra cement was then meticulously removed.  After the cement hardened we then placed a series of trial reductions and ultimately the size 6 mm poly-gave [place] the best soft tissue balance with good stability and motion.  The final size 6 poly-was then impacted onto our humeral stem final reduction was then performed the joint was copiously irrigated.  And then repaired the superior rotator cuff back to the collar of our implant using the suture tape sutures placed in a grasping fashion.  Subscapularis was significantly atrophied and scar applied and so did not repair the subscapularis.  This point finally irrigation was completed.  Hemostasis was obtained.  The deltopectoral interval was then reapproximated with a series of figure-of-eight #1 Vicryl sutures.  2-0 Vicryl used for subcu layer intra-articular 3 Monocryl for the skin pulled by Dermabond and Aquasol dressing the right arm was then placed into a sling and the patient was awakened extubated and taken to the recovery room in stable condition  Jenetta Loges PA-C was used as an Environmental consultant throughout this case essential to help with positioning of the patient, position extremity, tissue manipulation, suture management, implantation prosthesis, wound closure, and intraoperative decision making.  Marin Shutter MD Contact # [phone]

## 2017-12-18 NOTE — Anesthesia Procedure Notes (Signed)
Procedure Name: Intubation Date/Time: 12/18/2017 3:32 PM Performed by: Inda Coke, CRNA Pre-anesthesia Checklist: Patient identified, Emergency Drugs available, Suction available and Patient being monitored Patient Re-evaluated:Patient Re-evaluated prior to induction Oxygen Delivery Method: Circle System Utilized Preoxygenation: Pre-oxygenation with 100% oxygen Induction Type: IV induction Ventilation: Mask ventilation without difficulty Laryngoscope Size: Mac and 3 Grade View: Grade I Tube type: Oral Tube size: 7.0 mm Number of attempts: 1 Airway Equipment and Method: Stylet and Oral airway Placement Confirmation: ETT inserted through vocal cords under direct vision,  positive ETCO2 and breath sounds checked- equal and bilateral Secured at: 21 cm Tube secured with: Tape Dental Injury: Teeth and Oropharynx as per pre-operative assessment

## 2017-12-18 NOTE — Anesthesia Procedure Notes (Signed)
Anesthesia Regional Block: Interscalene brachial plexus block   Pre-Anesthetic Checklist: ,, timeout performed, Correct Patient, Correct Site, Correct Laterality, Correct Procedure, Correct Position, site marked, Risks and benefits discussed,  Surgical consent,  Pre-op evaluation,  At surgeon's request and post-op pain management  Laterality: Right  Prep: chloraprep       Needles:  Injection technique: Single-shot  Needle Type: Echogenic Stimulator Needle     Needle Length: 5cm  Needle Gauge: 22     Additional Needles:   Narrative:  Start time: 12/18/2017 2:23 PM End time: 12/18/2017 2:33 PM Injection made incrementally with aspirations every 5 mL.  Performed by: Personally  Anesthesiologist: Duane Boston, MD  Additional Notes: Functioning IV was confirmed and monitors applied.  A 37mm 22ga echogenic arrow stimulator was used. Sterile prep and drape,hand hygiene and sterile gloves were used.Ultrasound guidance: relevant anatomy identified, needle position confirmed, local anesthetic spread visualized around nerve(s)., vascular puncture avoided.  Image printed for medical record.  Negative aspiration and negative test dose prior to incremental administration of local anesthetic. The patient tolerated the procedure well.

## 2017-12-18 NOTE — Discharge Instructions (Signed)
Metta Clines. Supple, M.D., F.A.A.O.S. Orthopaedic Surgery Specializing in Arthroscopic and Reconstructive Surgery of the Shoulder and Knee [phone] [address] [place], [place] [address] - Fax [phone]   POST-OP TOTAL SHOULDER REPLACEMENT INSTRUCTIONS  1. Call the office at [phone] to schedule your first post-op appointment 10-14 days from the date of your surgery.  2. The bandage over your incision is waterproof. You may begin showering with this dressing on. You may leave this dressing on until first follow up appointment within 2 weeks. We prefer you leave this dressing in place until follow up however after 5-7 days if you are having itching or skin irritation and would like to remove it you may do so. Go slow and tug at the borders gently to break the bond the dressing has with the skin. At this point if there is no drainage it is okay to go without a bandage or you may cover it with a light guaze and tape. You can also expect significant bruising around your shoulder that will drift down your arm and into your chest wall. This is very normal and should resolve over several days.   3. Wear your sling/immobilizer at all times except to perform the exercises below or to occasionally let your arm dangle by your side to stretch your elbow. You also need to sleep in your sling immobilizer until instructed otherwise.  4. Range of motion to your elbow, wrist, and hand are encouraged 3-5 times daily. Exercise to your hand and fingers helps to reduce swelling you may experience.  5. Utilize ice to the shoulder 3-5 times minimum a day and additionally if you are experiencing pain.  6. Prescriptions for a pain medication and a muscle relaxant are provided for you. It is recommended that if you are experiencing pain that you pain medication alone is not controlling, add the muscle relaxant along with the pain medication which can give additional pain relief. The first 1-2 days  is generally the most severe of your pain and then should gradually decrease. As your pain lessens it is recommended that you decrease your use of the pain medications to an "as needed basis'" only and to always comply with the recommended dosages of the pain medications.  7. Pain medications can produce constipation along with their use. If you experience this, the use of an over the counter stool softener or laxative daily is recommended.   8. For additional questions or concerns, please do not hesitate to call the office. If after hours there is an answering service to forward your concerns to the physician on call.  9.Pain control following an exparel block  To help control your post-operative pain you received a nerve block  performed with Exparel which is a long acting anesthetic (numbing agent) which can provide pain relief and sensations of numbness (and relief of pain) in the operative shoulder and arm for up to 3 days. Sometimes it provides mixed relief, meaning you may still have numbness in certain areas of the arm but can still be able to move  parts of that arm, hand, and fingers. We recommend that your prescribed pain medications  be used as needed. We do not feel it is necessary to "pre medicate" and "stay ahead" of pain.  Taking narcotic pain medications when you are not having any pain can lead to unnecessary and potentially dangerous side effects.   POST-OP EXERCISES  OK to allow arm to dangle and move elbow wrist and  hand

## 2017-12-18 NOTE — Transfer of Care (Signed)
Immediate Anesthesia Transfer of Care Note  Patient: Diane Lucas  Procedure(s) Performed: REVERSE SHOULDER ARTHROPLASTY (Right Shoulder)  Patient Location: PACU  Anesthesia Type:GA combined with regional for post-op pain  Level of Consciousness: awake and alert   Airway & Oxygen Therapy: Patient Spontanous Breathing and Patient connected to nasal cannula oxygen  Post-op Assessment: Report given to RN and Post -op Vital signs reviewed and stable  Post vital signs: Reviewed and stable  Last Vitals:  Vitals Value Taken Time  BP 134/49 12/18/2017  5:51 PM  Temp    Pulse 80 12/18/2017  5:54 PM  Resp 18 12/18/2017  5:54 PM  SpO2 100 % 12/18/2017  5:54 PM  Vitals shown include unvalidated device data.  Last Pain:  Vitals:   12/18/17 1326  TempSrc:   PainSc: 0-No pain         Complications: No apparent anesthesia complications

## 2017-12-19 ENCOUNTER — Encounter (HOSPITAL_COMMUNITY): Payer: Self-pay | Admitting: Orthopedic Surgery

## 2017-12-19 MED ORDER — TRAMADOL HCL 50 MG PO TABS: 50.0000 mg | ORAL_TABLET | Freq: Four times a day (QID) | ORAL | 0 refills | Status: DC | PRN

## 2017-12-19 MED ORDER — ACETAMINOPHEN 500 MG PO TABS: 500.0000 mg | ORAL_TABLET | Freq: Four times a day (QID) | ORAL | 0 refills | Status: DC | PRN

## 2017-12-19 NOTE — Clinical Social Work Note (Signed)
Clinical Social Work Assessment  Patient Details  Name: Diane Lucas MRN: [id number] Date of Birth: Dec 01, 1930  Date of referral:  12/19/17               Reason for consult:  Discharge Planning                Permission sought to share information with:  Case Manager, Facility Sport and exercise psychologist, Family Supports Permission granted to share information::  Yes, Verbal Permission Granted  Name::     Energy manager::  SNFs  Relationship::  Son  Contact Information:  [phone]  Housing/Transportation Living arrangements for the past 2 months:  [place] of Information:  Patient Patient Interpreter Needed:  None Criminal Activity/Legal Involvement Pertinent to Current Situation/Hospitalization:  No - Comment as needed Significant Relationships:  Adult Children Lives with:  Self Do you feel safe going back to the place where you live?  No Need for family participation in patient care:  Yes (Comment)  Care giving concerns:  CSW received referral for possible SNF placement at time of discharge. Spoke with patient regarding possibility of SNF placement . Patient's son is currently unable to care for her at their home given patient's current needs and fall risk.  Patient and  Son expressed understanding of PT recommendation and are agreeable to SNF placement at time of discharge. CSW to continue to follow and assist with discharge planning needs.     Social Worker assessment / plan:  Spoke with patient and  Son Diane Lucas concerning possibility of rehab at [facility] before returning home.    Employment status:  Retired Nurse, adult PT Recommendations:  [place] / Referral to community resources:     Patient/Family's Response to care:  Patient and  Son Diane Lucas recognize need for rehab before returning home and are agreeable to a SNF in [place]. They report preference for returning to [facility] where patient has been in the  past. CSW called [facility] in which they are able to accept patient back and will start insurance auth.. CSW explained insurance authorization process. CSW provided patient with medicare.gov quality measure and overall rating sheet of chosen facility.Patient's family reported that they want patient to get stronger to be able to come back home.    Patient/Family's Understanding of and Emotional Response to Diagnosis, Current Treatment, and Prognosis:  Patient/family is realistic regarding therapy needs and expressed being hopeful for SNF placement. Patient expressed understanding of CSW role and discharge process as well as medical condition. No questions/concerns about plan or treatment.    Emotional Assessment Appearance:  Appears stated age Attitude/Demeanor/Rapport:  Engaged, Gracious Affect (typically observed):  Accepting, Adaptable Orientation:  Oriented to Self, Oriented to  Time, Oriented to Place, Oriented to Situation Alcohol / Substance use:  Not Applicable Psych involvement (Current and /or in the community):  No (Comment)  Discharge Needs  Concerns to be addressed:  Discharge Planning Concerns Readmission within the last 30 days:  No Current discharge risk:  Dependent with Mobility Barriers to Discharge:  Continued Medical Work up   [employer], [place] 12/19/2017, 1:29 PM

## 2017-12-19 NOTE — Progress Notes (Signed)
Orthopedic Tech Progress Note Patient Details:  Diane Lucas [birth year] [id number]  Ortho Devices Type of Ortho Device: Shoulder immobilizer Ortho Device/Splint Interventions: Application   Post Interventions Patient Tolerated: Well Instructions Provided: Care of device   Diane Lucas 12/19/2017, 12:33 PM

## 2017-12-19 NOTE — Discharge Summary (Addendum)
PATIENT ID:      Diane Lucas  MRN:     [id number] DOB/AGE:    1930/08/27 / 84 y.o.     DISCHARGE SUMMARY  ADMISSION DATE:    12/18/2017 DISCHARGE DATE:  12/22/2017  ADMISSION DIAGNOSIS: right shoulder 4-part fracture Past Medical History:  Diagnosis Date  . Arthritis   . Breast cancer ([place]) 02/2012   Right breast lumpectomy + anti-estrogen therapy.  Mammogram 02/2016 suspicious for recurrence as previous lumpectomy site, but bx done 03/14/16.showed fat necrosis with calcifications and no evidence of malignancy.--repeat mammo recommended 1 yr.  Pt to continue tamoxifen until 03/2017.  No further onc f/u as of 08/2016 f/u.  [name] Chronic renal insufficiency, stage III (moderate) (HCC)    CrCl 45 ml/min  . Fracture of humerus neck 11/09/2017   RIGHT Nondisplaced on ER x-rays, but on f/u ortho x-rays 3 wks later x-rays showed severely impacted and displaced 4 part prox humerus fracture-->plan is for reverese total shoulder arthroplasty 12/2017.  [name] GERD (gastroesophageal reflux disease)   . History of rheumatic fever age 54  . Hyperlipidemia   . Hypertension    Labile; renal arterial Dopplers 11/2012 mild to moderate left renal artery stenosis, does not explain hypertension.  . Hypothyroidism   . Maxillary sinus fracture ([place]) 11/09/2017   Left; sustained in a fall  . Osteoporosis   . Sleep apnea    STOP BANG SCORE 4, never tested for OSA  . Spondylosis of lumbar region without myelopathy or radiculopathy    ESI at L5-S1 helpful 05/2017; Dr. Nelva Bush started opioid pain med contract with her 06/25/17.  . 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 Varicose veins   . Vertigo     DISCHARGE DIAGNOSIS:   Active Problems:   S/P reverse total shoulder arthroplasty, right   PROCEDURE: Procedure(s): REVERSE SHOULDER ARTHROPLASTY on 12/18/2017  CONSULTS:    HISTORY:  See H&P in chart.  HOSPITAL COURSE:  Diane Lucas is a 84 y.o. admitted on 12/18/2017 with a diagnosis of right  shoulder 4-part fracture.  They were brought to the operating room on 12/18/2017 and underwent Procedure(s): [place].    They were given perioperative antibiotics:  Anti-infectives (From admission, onward)   Start     Dose/Rate Route Frequency Ordered Stop   12/19/17 0600  ceFAZolin (ANCEF) IVPB 2g/100 mL premix     2 g 200 mL/hr over 30 Minutes Intravenous On call to O.R. 12/18/17 1252 12/18/17 1535    .  Patient underwent the above named procedure and tolerated it well. The following day they were hemodynamically stable and pain was controlled on oral analgesics. They were neurovascularly intact to the operative extremity. OT/PT was ordered and worked with patient per protocol. She lived alone and after her original injury required care in skilled short term at [facility]. It was felt appropriate for a short term stay there post op They were medically and orthopaedically stable for discharge on day 1 pending approvals .   DIAGNOSTIC STUDIES:  RECENT RADIOGRAPHIC STUDIES :  No results found.  RECENT VITAL SIGNS:   Patient Vitals for the past 24 hrs:  BP Temp Temp src Pulse Resp SpO2 Height Weight  12/19/17 0318 (!) 110/56 98.3 F (36.8 C) Oral 69 16 97 % - -  12/19/17 0039 (!) 112/45 98.2 F (36.8 C) Oral 74 14 95 % - -  12/18/17 2040 (!) 123/49 (!) 97.5 F (36.4 C) Oral 86 - 96 % - -  12/18/17 1904 132/68 (!) 97.4 F (36.3 C) Oral 70 - 97 % - -  12/18/17 1835 140/63 - - 81 15 97 % - -  12/18/17 1820 135/80 - - 80 15 96 % - -  12/18/17 1805 138/71 - - 82 18 97 % - -  12/18/17 1751 (!) 134/49 97.7 F (36.5 C) - 81 (!) 21 97 % - -  12/18/17 1425 (!) 131/52 - - 74 (!) 24 100 % - -  12/18/17 1420 - - - 71 19 100 % - -  12/18/17 1415 (!) 140/55 - - 72 - 100 % - -  12/18/17 1326 - - - - - - 5' 1.5" (1.562 m) 79.9 kg  12/18/17 1312 (!) 154/48 98.4 F (36.9 C) Oral 75 18 99 % - -  .  RECENT EKG RESULTS:    Orders placed or performed during the hospital  encounter of 12/16/17  . EKG 12 lead  . EKG 12 lead  . EKG 12-Lead  . EKG 12-Lead    DISCHARGE INSTRUCTIONS:    DISCHARGE MEDICATIONS:   Allergies as of 12/19/2017      Reactions   Aspirin Other (See Comments)   REACTION: causes bleeding   Benazepril Swelling   TONGUE   Xarelto [rivaroxaban] Other (See Comments)   EPISTAXIS   Amlodipine Swelling   SWELLING REACTION UNSPECIFIED    Minocycline Swelling   SWELLING REACTION UNSPECIFIED       Medication List    TAKE these medications   acetaminophen 500 MG tablet Commonly known as:  TYLENOL Take 1-2 tablets (500-1,000 mg total) by mouth every 6 (six) hours as needed for mild pain, moderate pain or headache. Take 2 tablet twice a day What changed:    how much to take  when to take this   BIOTIN PO Take 1 tablet by mouth daily.   calcium-vitamin D 500-200 MG-UNIT tablet Take 2 tablets by mouth daily.   Cholecalciferol 25 MCG (1000 UT) tablet Take 1,000 Units by mouth daily.   levothyroxine 50 MCG tablet Commonly known as:  SYNTHROID, LEVOTHROID TAKE ONE TABLET BY MOUTH BEFORE BREAKFAST What changed:    how much to take  how to take this  when to take this  additional instructions   Magnesium 250 MG Tabs Take 250 mg by mouth 2 (two) times daily.   MELATONIN PO Take 1 tablet by mouth at bedtime as needed (sleep).   omeprazole 20 MG capsule Commonly known as:  PRILOSEC TAKE ONE CAPSULE BY MOUTH DAILY   simvastatin 10 MG tablet Commonly known as:  ZOCOR TAKE ONE TABLET BY MOUTH AT BEDTIME   traMADol 50 MG tablet Commonly known as:  ULTRAM Take 1 tablet (50 mg total) by mouth every 6 (six) hours as needed for moderate pain.   valsartan-hydrochlorothiazide 160-25 MG tablet Commonly known as:  DIOVAN-HCT TAKE ONE TABLET BY MOUTH DAILY   VITAMIN B-6 PO Take 1 tablet by mouth daily.       OT please work with patient :  PROM 10 ER, 45 ABD,60 FE , PASSIVE ROM FOR ADL's ONLY, NOT for EXERCISES. OK  to exercise elbow wrist and hand rom and for edema control No pendulums, may allow arm to dangle Pt may shower  Leave current dressing intact until FU  Call to arrange FU appt   FOLLOW UP VISIT:   Follow-up Information    Justice Britain, MD.   Specialty:  Orthopedic Surgery Why:  call to be seen  in 10-14 days Contact information: [address] [place] [address] [phone]           DISCHARGE TO: Skilled   DISCHARGE CONDITION:  Thereasa Parkin Sanvi Ehler for Dr. Justice Britain 12/19/2017, 9:03 AM

## 2017-12-19 NOTE — Evaluation (Signed)
Occupational Therapy Evaluation Patient Details Name: Diane Lucas MRN: [id number] DOB: [birth year] Today's Date: 12/19/2017    History of Present Illness 84 y.o. admitted on 12/18/2017 with a diagnosis of right shoulder 4-part fracture.  Now s/p  REVERSE SHOULDER ARTHROPLASTY.   Clinical Impression   Pt admitted with the above diagnoses and presents with below problem list. Pt will benefit from continued acute OT to address the below listed deficits and maximize independence with basic ADLs prior to d/c to venue below. PTA pt was mod I with ADLs. Pt is currently min to max A with ADLs, min guard to min A with functional mobility/transfers. Pt is from home alone and will benefit from [facility] for rehab prior to returning home.      Follow Up Recommendations  SNF    Equipment Recommendations  Other (comment)(TBD next venue)    Recommendations for Other Services PT consult     Precautions / Restrictions Precautions Precautions: Fall;Shoulder Shoulder Interventions: Shoulder sling/immobilizer;At all times;Off for dressing/bathing/exercises Precaution Booklet Issued: Yes (comment) Required Braces or Orthoses: Sling Restrictions Weight Bearing Restrictions: Yes      Mobility Bed Mobility               General bed mobility comments: up in recliner  Transfers Overall transfer level: Needs assistance Equipment used: 1 person hand held assist Transfers: Sit to/from Stand Sit to Stand: Min guard;Min assist         General transfer comment: to/from recliner. Steadying assist    Balance Overall balance assessment: Needs assistance Sitting-balance support: Single extremity supported;Feet supported Sitting balance-Leahy Scale: Fair     Standing balance support: Single extremity supported Standing balance-Leahy Scale: Poor Standing balance comment: needs external support                           ADL either performed or assessed with clinical judgement   ADL  Overall ADL's : Needs assistance/impaired Eating/Feeding: Set up;Minimal assistance;Sitting   Grooming: Moderate assistance;Sitting   Upper Body Bathing: Sitting;Maximal assistance   Lower Body Bathing: Moderate assistance;Sit to/from stand   Upper Body Dressing : Maximal assistance;Sitting   Lower Body Dressing: Moderate assistance;Sit to/from stand   Toilet Transfer: Minimal assistance;Ambulation;Comfort height toilet;Grab bars Toilet Transfer Details (indicate cue type and reason): +1 HHA Toileting- Clothing Manipulation and Hygiene: Moderate assistance   Tub/ Shower Transfer: Minimal assistance;Ambulation   Functional mobility during ADLs: Minimal assistance;Min guard General ADL Comments: Pt completed in-room functional mobility. Educated on shoulder precautions and UB ADL strategies     Vision Baseline Vision/History: Wears glasses       Perception     Praxis      Pertinent Vitals/Pain Pain Assessment: No/denies pain     Hand Dominance Right   Extremity/Trunk Assessment Upper Extremity Assessment Upper Extremity Assessment: RUE deficits/detail RUE Deficits / Details: s/p REVERSE SHOULDER ARTHROPLASTY. Nerve block still largely in effect. Able to move digits and wrist.  RUE: Unable to fully assess due to immobilization   Lower Extremity Assessment Lower Extremity Assessment: Defer to PT evaluation       Communication Communication Communication: No difficulties   Cognition Arousal/Alertness: Awake/alert Behavior During Therapy: WFL for tasks assessed/performed Overall Cognitive Status: Within Functional Limits for tasks assessed                                     General  Comments       Exercises Exercises: Other exercises Other Exercises Other Exercises: demostrated e/w/h ROM. Limited by nerve block   Shoulder Instructions      Home Living Family/patient expects to be discharged to:: Private residence Living Arrangements:  Alone   Type of Home: House Home Access: Ramped entrance     Home Layout: One level               Home Equipment: [place] - single point          Prior Functioning/Environment Level of Independence: Independent with assistive device(s)        Comments: uses SPC in community        OT Problem List: Impaired balance (sitting and/or standing);Decreased knowledge of use of DME or AE;Decreased knowledge of precautions;Decreased strength;Decreased range of motion;Impaired UE functional use;Pain      OT Treatment/Interventions: Self-care/ADL training;Therapeutic exercise;DME and/or AE instruction;Therapeutic activities;Patient/family education;Balance training    OT Goals(Current goals can be found in the care plan section) Acute Rehab OT Goals Patient Stated Goal: rehab then home. regain independence. have a vacation. OT Goal Formulation: With patient Time For Goal Achievement: 12/26/17 Potential to Achieve Goals: Good ADL Goals Pt Will Perform Upper Body Bathing: with modified independence;sitting Pt Will Perform Lower Body Bathing: with modified independence;sit to/from stand Pt Will Perform Upper Body Dressing: with min assist Pt Will Perform Lower Body Dressing: with min assist Pt Will Transfer to Toilet: with modified independence Pt Will Perform Toileting - Clothing Manipulation and hygiene: with modified independence;sit to/from stand Pt/caregiver will Perform Home Exercise Program: With written HEP provided;With Supervision  OT Frequency: Min 3X/week   Barriers to D/C:            Co-evaluation              AM-PAC OT "6 Clicks" Daily Activity     Outcome Measure Help from another person eating meals?: A Little Help from another person taking care of personal grooming?: A Lot Help from another person toileting, which includes using toliet, bedpan, or urinal?: A Little Help from another person bathing (including washing, rinsing, drying)?: A Lot Help  from another person to put on and taking off regular upper body clothing?: A Lot Help from another person to put on and taking off regular lower body clothing?: A Little 6 Click Score: 15   End of Session Equipment Utilized During Treatment: Other (comment)(sling) Nurse Communication: Other (comment)(needs another sling)  Activity Tolerance: Patient tolerated treatment well Patient left: in chair;with call bell/phone within reach  OT Visit Diagnosis: Unsteadiness on feet (R26.81);Pain Pain - Right/Left: Right Pain - part of body: Shoulder                Time: 6384-5364 OT Time Calculation (min): 33 min Charges:  OT General Charges $OT Visit: 1 Visit OT Evaluation $OT Eval Low Complexity: 1 Low OT Treatments $Self Care/Home Management : 8-22 mins  Tyrone Schimke, OT Acute Rehabilitation Services Pager: [phone] Office: [phone]   Hortencia Pilar 12/19/2017, 11:13 AM

## 2017-12-19 NOTE — NC FL2 (Signed)
[place] MEDICAID FL2 LEVEL OF CARE SCREENING TOOL     IDENTIFICATION  Patient Name: Diane Lucas Birthdate: 1930/09/04 Sex: female Admission Date (Current Location): 12/18/2017  [facility] [place] Number:  [employer] and Address:  [facility] [place] [address], [place], [place] [address]      Provider Number: [id number]  Attending Physician Name and Address:  Justice Britain, MD  Relative Name and Phone Number:  [name] (son) [phone]    Current Level of Care: Hospital Recommended Level of Care: [place] Prior Approval Number:    Date Approved/Denied:   PASRR Number: [id number]  Discharge Plan: SNF    Current Diagnoses: Patient Active Problem List   Diagnosis Date Noted  . S/P reverse total shoulder arthroplasty, right 12/18/2017  . Hyperlipidemia 04/25/2015  . Chronic renal insufficiency, stage III (moderate) ([place]) 04/25/2015  . Thrombocytopenia ([place]) 01/11/2014  . Dyslipidemia 01/21/2013  . Obesity (BMI 30-39.9) 11/22/2012  . Varicose veins of both legs with edema 11/22/2012  . HTN (hypertension) 11/22/2012  . Leg pain, bilateral 11/22/2012  . Facial numbness 11/22/2012  . Primary cancer of upper outer quadrant of right female breast ([place]) 03/05/2012  . 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    Orientation RESPIRATION BLADDER Height & Weight     Self, Time, Situation, Place  Normal Continent(possible stress incontinence) Weight: 79.9 kg Height:  5' 1.5" (156.2 cm)  BEHAVIORAL SYMPTOMS/MOOD NEUROLOGICAL BOWEL NUTRITION STATUS      Continent Diet(see discharge summary)  AMBULATORY STATUS COMMUNICATION OF NEEDS Skin   Extensive Assist Verbally                         Personal Care Assistance Level of Assistance  Bathing, Feeding, Dressing, Total care Bathing Assistance: Maximum assistance Feeding assistance: Limited assistance Dressing Assistance: Maximum assistance Total Care Assistance:  Maximum assistance   Functional Limitations Info  Sight, Hearing, Speech Sight Info: Adequate Hearing Info: Adequate Speech Info: Adequate    SPECIAL CARE FACTORS FREQUENCY  PT (By licensed PT), OT (By licensed OT)     PT Frequency: min 5x weekly OT Frequency: min 3x weeky            Contractures Contractures Info: Not present    Additional Factors Info  Code Status, Allergies Code Status Info: full Allergies Info: Allergies:  Aspirin, Benazepril, Xarelto Rivaroxaban, Amlodipine, Minocycline           Current Medications (12/19/2017):  This is the current hospital active medication list Current Facility-Administered Medications  Medication Dose Route Frequency Provider Last Rate Last Dose  . acetaminophen (TYLENOL) tablet 325-650 mg  325-650 mg Oral Q6H PRN Shuford, Tracy, PA-C      . bisacodyl (DULCOLAX) EC tablet 5 mg  5 mg Oral Daily PRN Shuford, Olivia Mackie, PA-C      . diphenhydrAMINE (BENADRYL) 12.5 MG/5ML elixir 12.5-25 mg  12.5-25 mg Oral Q4H PRN Shuford, Tracy, PA-C      . docusate sodium (COLACE) capsule 100 mg  100 mg Oral BID Shuford, Tracy, PA-C   100 mg at 12/19/17 0803  . irbesartan (AVAPRO) tablet 150 mg  150 mg Oral Daily Justice Britain, MD   150 mg at 12/19/17 0802   And  . hydrochlorothiazide (HYDRODIURIL) tablet 25 mg  25 mg Oral Daily Justice Britain, MD   25 mg at 12/19/17 0803  . HYDROmorphone (DILAUDID) injection 0.5-1 mg  0.5-1 mg Intravenous Q4H PRN Shuford, Olivia Mackie, PA-C      .  lactated ringers infusion   Intravenous Continuous Barnet Glasgow, MD 10 mL/hr at 12/18/17 1407    . lactated ringers infusion   Intravenous Continuous Shuford, Olivia Mackie, PA-C 75 mL/hr at 12/19/17 8675    . levothyroxine (SYNTHROID, LEVOTHROID) tablet 50 mcg  50 mcg Oral QAC breakfast Shuford, Tracy, PA-C   50 mcg at 12/19/17 0659  . magnesium citrate solution 1 Bottle  1 Bottle Oral Once PRN Shuford, Olivia Mackie, PA-C      . menthol-cetylpyridinium (CEPACOL) lozenge 3 mg  1 lozenge Oral  PRN Shuford, Olivia Mackie, PA-C       Or  . phenol (CHLORASEPTIC) mouth spray 1 spray  1 spray Mouth/Throat PRN Shuford, Olivia Mackie, PA-C      . metoCLOPramide (REGLAN) tablet 5-10 mg  5-10 mg Oral Q8H PRN Shuford, Tracy, PA-C       Or  . metoCLOPramide (REGLAN) injection 5-10 mg  5-10 mg Intravenous Q8H PRN Shuford, Tracy, PA-C      . ondansetron (ZOFRAN) tablet 4 mg  4 mg Oral Q6H PRN Shuford, Tracy, PA-C       Or  . ondansetron (ZOFRAN) injection 4 mg  4 mg Intravenous Q6H PRN Shuford, Tracy, PA-C      . oxyCODONE (Oxy IR/ROXICODONE) immediate release tablet 10 mg  10 mg Oral Q4H PRN Shuford, Tracy, PA-C      . oxyCODONE (Oxy IR/ROXICODONE) immediate release tablet 5 mg  5 mg Oral Q4H PRN Shuford, Tracy, PA-C      . pantoprazole (PROTONIX) EC tablet 40 mg  40 mg Oral Daily Shuford, Tracy, PA-C   40 mg at 12/19/17 0803  . polyethylene glycol (MIRALAX / GLYCOLAX) packet 17 g  17 g Oral Daily PRN Shuford, Tracy, PA-C      . traMADol (ULTRAM) tablet 50 mg  50 mg Oral Q6H PRN Shuford, Olivia Mackie, PA-C         Discharge Medications: Please see discharge summary for a list of discharge medications.  Relevant Imaging Results:  Relevant Lab Results:   Additional Information SSN: [id number]  Alberteen Sam, LCSW

## 2017-12-19 NOTE — Evaluation (Signed)
Physical Therapy Evaluation Patient Details Name: Diane Lucas MRN: [id number] DOB: [birth year] Today's Date: 12/19/2017   History of Present Illness  Diane Lucas is an 84yo admitted on 12/18/2017 with a diagnosis of right shoulder 4-part fracture.  Now s/p  REVERSE SHOULDER ARTHROPLASTY. Pt sustained a fall with significant shoulder injury back in Nov 2019, has a short stay at [facility] to improve mobility/strength/safety, but still quite limited. Pt lives alone and reports to have had 4 significant falls in the past year.   Clinical Impression  Pt admitted with above diagnosis. Pt currently with functional limitations due to the deficits listed below (see "PT Problem List"). Upon entry, pt in bed, no family/caregiver present. The pt is awake and agreeable to participate. Pain well managed. [place] to supervision for transfers and AMB with intermittent external support. Unsupervised AMB not safe at this time, although SPC use is good in general. Pt has limited social support and weakness with transfers is concerning for another fall. Pt has had several serious falls in the past 12 months. Pt will benefit from skilled PT intervention to increase independence and safety with basic mobility in preparation for discharge to the venue listed below.       Follow Up Recommendations SNF;Supervision for mobility/OOB;Supervision - Intermittent    Equipment Recommendations  Cane    Recommendations for Other Services       Precautions / Restrictions Precautions Precautions: Fall;Shoulder Shoulder Interventions: Shoulder sling/immobilizer;At all times;Off for dressing/bathing/exercises Precaution Booklet Issued: Yes (comment) Required Braces or Orthoses: Sling Restrictions Weight Bearing Restrictions: Yes RUE Weight Bearing: Non weight bearing      Mobility  Bed Mobility               General bed mobility comments: Up in chair upon entry   Transfers Overall transfer level: Needs  assistance Equipment used: None Transfers: Sit to/from Stand Sit to Stand: Min guard         General transfer comment: performed 6x from chair, pushing off from chair with LUE, moderate to high effort in legs noted, but balance well maintained in stance.   Ambulation/Gait Ambulation/Gait assistance: Min guard Gait Distance (Feet): 140 Feet Assistive device: Straight cane Gait Pattern/deviations: Antalgic;Staggering left;Staggering right Gait velocity: 0.22m/s    General Gait Details: appears mildly unsteady at times, but overall AMB with care and caution as she has high falls anxiety. (uses SPC with good 2-point sequencing and minimal weightbearing without cuing)  Stairs            Wheelchair Mobility    Modified Rankin (Stroke Patients Only)       Balance           Standing balance support: Single extremity supported;During functional activity Standing balance-Diane Lucas Scale: Fair Standing balance comment: SPC for stability                             Pertinent Vitals/Pain Pain Assessment: ("I'm FIne")    Home Living Family/patient expects to be discharged to:: Private residence Living Arrangements: Alone Available Help at Discharge: Personal care attendant Type of Home: House Home Access: Ramped entrance     Home Layout: One level Home Equipment: Cane - single point;Wheelchair - manual      Prior Function Level of Independence: Independent with assistive device(s)         Comments: uses SPC in community     Hand Dominance   Dominant Hand: Right    Extremity/Trunk  Assessment                Communication   Communication: No difficulties  Cognition Arousal/Alertness: Awake/alert Behavior During Therapy: WFL for tasks assessed/performed Overall Cognitive Status: Within Functional Limits for tasks assessed                                        General Comments      Exercises     Assessment/Plan     PT Assessment Patient needs continued PT services  PT Problem List Decreased strength;Decreased activity tolerance;Decreased balance;Decreased mobility       PT Treatment Interventions Balance training;Gait training;Functional mobility training;Stair training;Therapeutic activities;Therapeutic exercise;Patient/family education    PT Goals (Current goals can be found in the Care Plan section)  Acute Rehab PT Goals Patient Stated Goal: rehab then home. regain independence. have a vacation. PT Goal Formulation: With patient Time For Goal Achievement: 12/26/17 Potential to Achieve Goals: Good    Frequency Min 3X/week   Barriers to discharge Decreased caregiver support      Co-evaluation               AM-PAC PT "6 Clicks" Mobility  Outcome Measure Help needed turning from your back to your side while in a flat bed without using bedrails?: A Little Help needed moving from lying on your back to sitting on the side of a flat bed without using bedrails?: A Little Help needed moving to and from a bed to a chair (including a wheelchair)?: A Little Help needed standing up from a chair using your arms (e.g., wheelchair or bedside chair)?: A Little Help needed to walk in hospital room?: A Little Help needed climbing 3-5 steps with a railing? : A Lot 6 Click Score: 17    End of Session Equipment Utilized During Treatment: Gait belt Activity Tolerance: Patient tolerated treatment well Patient left: in chair;with family/visitor present;with call bell/phone within reach Nurse Communication: Mobility status(Tachy ) PT Visit Diagnosis: Unsteadiness on feet (R26.81);Other abnormalities of gait and mobility (R26.89);History of falling (Z91.81);Difficulty in walking, not elsewhere classified (R26.2)    Time: 5830-9407 PT Time Calculation (min) (ACUTE ONLY): 36 min   Charges:   PT Evaluation $PT Eval Moderate Complexity: 1 Mod PT Treatments $Therapeutic Activity: 8-22 mins         3:00 PM, 12/19/17 Diane Lucas, PT, DPT Physical Therapist - [place] [phone] (Pager)  [phone] (Office)      Diane Lucas C 12/19/2017, 2:58 PM

## 2017-12-20 NOTE — Progress Notes (Signed)
CSW received call back from [facility] stating still no insurance authorization, they anticipate Monday due to weekend.   CSW will continue to follow up on Monday.   [place], [place]

## 2017-12-20 NOTE — Plan of Care (Signed)
  Problem: Education: Goal: Knowledge of General Education information will improve Description: Including pain rating scale, medication(s)/side effects and non-pharmacologic comfort measures Outcome: Progressing   Problem: Clinical Measurements: Goal: Will remain free from infection Outcome: Progressing   Problem: Activity: Goal: Risk for activity intolerance will decrease Outcome: Progressing   Problem: Nutrition: Goal: Adequate nutrition will be maintained Outcome: Progressing   Problem: Pain Managment: Goal: General experience of comfort will improve Outcome: Progressing   Problem: Safety: Goal: Ability to remain free from injury will improve Outcome: Progressing   

## 2017-12-20 NOTE — Progress Notes (Signed)
Subjective: 2 Days Post-Op Procedure(s) (LRB): REVERSE SHOULDER ARTHROPLASTY (Right) Patient reports pain as mild and moderate.   Awaiting SNF approval/final dispo Did not sleep much last night, does not feel it was due to pain No other c/o this AM  Objective: Vital signs in last 24 hours: Temp:  [98 F (36.7 C)-98.3 F (36.8 C)] 98.1 F (36.7 C) (12/07 0544) Pulse Rate:  [68-125] 68 (12/07 0544) Resp:  [18-22] 19 (12/07 0544) BP: (108-125)/(44-50) 114/44 (12/07 0544) SpO2:  [98 %-99 %] 99 % (12/07 0544)  Intake/Output from previous day: 12/06 0701 - 12/07 0700 In: 1268.8 [P.O.:1010; I.V.:258.8] Out: -  Intake/Output this shift: No intake/output data recorded.  No results for input(s): HGB in the last 72 hours. No results for input(s): WBC, RBC, HCT, PLT in the last 72 hours. No results for input(s): NA, K, CL, CO2, BUN, CREATININE, GLUCOSE, CALCIUM in the last 72 hours. No results for input(s): LABPT, INR in the last 72 hours.  Neurologically intact ABD soft Neurovascular intact Sensation intact distally Intact pulses distally Dorsiflexion/Plantar flexion intact Incision: dressing C/D/I and no drainage No cellulitis present Compartment soft no sign of DVT  Assessment/Plan: 2 Days Post-Op Procedure(s) (LRB): REVERSE SHOULDER ARTHROPLASTY (Right) Advance diet Up with therapy D/C IV fluids Awaiting approval for bed at SNF Stable for D/C when we receive approval If bed available today please call me for D/C orders for pt [phone]  Rishi Vicario M. 12/20/2017, 8:29 AM

## 2017-12-21 ENCOUNTER — Encounter (HOSPITAL_COMMUNITY): Payer: Self-pay | Admitting: *Deleted

## 2017-12-21 NOTE — Plan of Care (Signed)

## 2017-12-21 NOTE — Progress Notes (Signed)
Occupational Therapy Treatment Patient Details Name: Diane Lucas MRN: [id number] DOB: 12-May-1930 Today's Date: 12/21/2017    History of present illness Diane Lucas is an 84yo admitted on 12/18/2017 with a diagnosis of right shoulder 4-part fracture.  Now s/p  REVERSE SHOULDER ARTHROPLASTY. Pt sustained a fall with significant shoulder injury back in Nov 2019, has a short stay at [facility] to improve mobility/strength/safety, but still quite limited. Pt lives alone and reports to have had 4 significant falls in the past year.    OT comments  Pt. Able to complete ROM for elbow, wrist, and digits R UE.  Reviewed sling positioning, and don/doff sling techniques also.  Tolerated well, note d/c pending for snf soon.  Follow Up Recommendations  SNF    Equipment Recommendations       Recommendations for Other Services      Precautions / Restrictions Precautions Precautions: Fall;Shoulder Shoulder Interventions: Shoulder sling/immobilizer;At all times;Off for dressing/bathing/exercises Required Braces or Orthoses: Sling Restrictions Weight Bearing Restrictions: Yes RUE Weight Bearing: Non weight bearing       Mobility Bed Mobility                  Transfers                      Balance                                           ADL either performed or assessed with clinical judgement   ADL                                               Vision       Perception     Praxis      Cognition Arousal/Alertness: Awake/alert Behavior During Therapy: WFL for tasks assessed/performed Overall Cognitive Status: Within Functional Limits for tasks assessed                                          Exercises Shoulder Exercises Elbow Flexion: AROM;10 reps;Right;Seated Elbow Extension: AROM;10 reps;Right;Seated Wrist Flexion: AROM;10 reps;Right;Seated Wrist Extension: AROM;10 reps;Right;Seated Digit Composite Flexion:  AROM;10 reps;Right;Seated Composite Extension: AAROM;10 reps;Right;Seated   Shoulder Instructions       General Comments      Pertinent Vitals/ Pain       Pain Assessment: No/denies pain  Home Living                                          Prior Functioning/Environment              Frequency  Min 3X/week        Progress Toward Goals  OT Goals(current goals can now be found in the care plan section)  Progress towards OT goals: Progressing toward goals     Plan      Co-evaluation                 AM-PAC OT "6 Clicks" Daily Activity     Outcome Measure   Help from  another person eating meals?: A Little Help from another person taking care of personal grooming?: A Lot Help from another person toileting, which includes using toliet, bedpan, or urinal?: A Little Help from another person bathing (including washing, rinsing, drying)?: A Lot Help from another person to put on and taking off regular upper body clothing?: A Lot Help from another person to put on and taking off regular lower body clothing?: A Little 6 Click Score: 15    End of Session Equipment Utilized During Treatment: Other (comment)(sling)  OT Visit Diagnosis: Unsteadiness on feet (R26.81);Pain Pain - Right/Left: Right Pain - part of body: Shoulder   Activity Tolerance Patient tolerated treatment well   Patient Left in chair;with call bell/phone within reach;with family/visitor present   Nurse Communication          Time: 1113-1130 OT Time Calculation (min): 17 min  Charges: OT General Charges $OT Visit: 1 Visit OT Treatments $Self Care/Home Management : 8-22 mins   Janice Coffin, COTA/L 12/21/2017, 11:41 AM

## 2017-12-21 NOTE — Progress Notes (Signed)
Subjective: 3 Days Post-Op Procedure(s) (LRB): REVERSE SHOULDER ARTHROPLASTY (Right) Patient reports pain as mild.  Up walking yesterday.  Daughter in today visiting from [place].  Objective: Vital signs in last 24 hours: Temp:  [97.8 F (36.6 C)-98.3 F (36.8 C)] 98.2 F (36.8 C) (12/08 0531) Pulse Rate:  [66-88] 66 (12/08 0531) Resp:  [18] 18 (12/07 2020) BP: (107-125)/(46-53) 125/53 (12/08 0531) SpO2:  [94 %-100 %] 94 % (12/08 0531)  Intake/Output from previous day: 12/07 0701 - 12/08 0700 In: 760 [P.O.:760] Out: -  Intake/Output this shift: No intake/output data recorded.  No results for input(s): HGB in the last 72 hours. No results for input(s): WBC, RBC, HCT, PLT in the last 72 hours. No results for input(s): NA, K, CL, CO2, BUN, CREATININE, GLUCOSE, CALCIUM in the last 72 hours. No results for input(s): LABPT, INR in the last 72 hours.  PE:  R UE immobilized in a sling.  Incision dressed and dry.  Pulses palpable at the wrist.  Normal motor function in the radial, median and ulnar n dist.    Assessment/Plan: 3 Days Post-Op Procedure(s) (LRB): REVERSE SHOULDER ARTHROPLASTY (Right) Discharge to SNF when bed available. Continue PT / OT. OK for pt to walk with daughter today.    Wylene Simmer 12/21/2017, 9:48 AM

## 2017-12-21 NOTE — Progress Notes (Signed)
Clinical Social Worker following patient for support and discharge needs. CSW spoke to patients son to make him aware that authorization has not yet been received by [employer] Patients son [name] stated he understands and will follow up with CSW tomorrow to see if authorization has been attained.\    Rhea Pink, MSW,  [place]

## 2017-12-22 DIAGNOSIS — N183 Chronic kidney disease, stage 3 (moderate): Secondary | ICD-10-CM | POA: Diagnosis not present

## 2017-12-22 DIAGNOSIS — Z853 Personal history of malignant neoplasm of breast: Secondary | ICD-10-CM | POA: Diagnosis not present

## 2017-12-22 DIAGNOSIS — K219 Gastro-esophageal reflux disease without esophagitis: Secondary | ICD-10-CM | POA: Diagnosis not present

## 2017-12-22 DIAGNOSIS — Z7409 Other reduced mobility: Secondary | ICD-10-CM | POA: Diagnosis not present

## 2017-12-22 DIAGNOSIS — E039 Hypothyroidism, unspecified: Secondary | ICD-10-CM | POA: Diagnosis not present

## 2017-12-22 DIAGNOSIS — S42241S 4-part fracture of surgical neck of right humerus, sequela: Secondary | ICD-10-CM | POA: Diagnosis not present

## 2017-12-22 DIAGNOSIS — M810 Age-related osteoporosis without current pathological fracture: Secondary | ICD-10-CM | POA: Diagnosis not present

## 2017-12-22 DIAGNOSIS — Z471 Aftercare following joint replacement surgery: Secondary | ICD-10-CM | POA: Diagnosis not present

## 2017-12-22 DIAGNOSIS — W19XXXD Unspecified fall, subsequent encounter: Secondary | ICD-10-CM | POA: Diagnosis not present

## 2017-12-22 DIAGNOSIS — D696 Thrombocytopenia, unspecified: Secondary | ICD-10-CM | POA: Diagnosis not present

## 2017-12-22 DIAGNOSIS — I10 Essential (primary) hypertension: Secondary | ICD-10-CM | POA: Diagnosis not present

## 2017-12-22 DIAGNOSIS — I8390 Asymptomatic varicose veins of unspecified lower extremity: Secondary | ICD-10-CM | POA: Diagnosis not present

## 2017-12-22 DIAGNOSIS — Z96641 Presence of right artificial hip joint: Secondary | ICD-10-CM | POA: Diagnosis not present

## 2017-12-22 DIAGNOSIS — S4291XD Fracture of right shoulder girdle, part unspecified, subsequent encounter for fracture with routine healing: Secondary | ICD-10-CM | POA: Diagnosis not present

## 2017-12-22 DIAGNOSIS — R42 Dizziness and giddiness: Secondary | ICD-10-CM | POA: Diagnosis not present

## 2017-12-22 DIAGNOSIS — Z96611 Presence of right artificial shoulder joint: Secondary | ICD-10-CM | POA: Diagnosis not present

## 2017-12-22 DIAGNOSIS — G4730 Sleep apnea, unspecified: Secondary | ICD-10-CM | POA: Diagnosis not present

## 2017-12-22 DIAGNOSIS — M1990 Unspecified osteoarthritis, unspecified site: Secondary | ICD-10-CM | POA: Diagnosis not present

## 2017-12-22 DIAGNOSIS — I129 Hypertensive chronic kidney disease with stage 1 through stage 4 chronic kidney disease, or unspecified chronic kidney disease: Secondary | ICD-10-CM | POA: Diagnosis not present

## 2017-12-22 DIAGNOSIS — M47816 Spondylosis without myelopathy or radiculopathy, lumbar region: Secondary | ICD-10-CM | POA: Diagnosis not present

## 2017-12-22 DIAGNOSIS — E1122 Type 2 diabetes mellitus with diabetic chronic kidney disease: Secondary | ICD-10-CM | POA: Diagnosis not present

## 2017-12-22 DIAGNOSIS — E785 Hyperlipidemia, unspecified: Secondary | ICD-10-CM | POA: Diagnosis not present

## 2017-12-22 DIAGNOSIS — S42301D Unspecified fracture of shaft of humerus, right arm, subsequent encounter for fracture with routine healing: Secondary | ICD-10-CM | POA: Diagnosis not present

## 2017-12-22 NOTE — Plan of Care (Signed)
  Problem: Education: Goal: Knowledge of General Education information will improve Description Including pain rating scale, medication(s)/side effects and non-pharmacologic comfort measures Outcome: Progressing   Problem: Health Behavior/Discharge Planning: Goal: Ability to manage health-related needs will improve Outcome: Progressing   

## 2017-12-22 NOTE — Discharge Summary (Signed)
Jenetta Loges, PA-C  Physician Assistant  Orthopedics  Discharge Summary  Cosign Needed  Date of Service:  12/19/2017 9:03 AM          Cosign Needed      Expand All Collapse All    Show:Clear all [x] Manual[x] Template[x] Copied  Added by: [x] Jovonni Borquez, Olivia Mackie, PA-C  [] Hover for details PATIENT ID:      Diane Lucas         MRN:                  [id number] DOB/AGE:          Feb 04, 1930 / 84 y.o.                                      DISCHARGE SUMMARY  ADMISSION DATE:    12/18/2017 DISCHARGE DATE:  12/22/17  ADMISSION DIAGNOSIS: right shoulder 4-part fracture     Past Medical History:  Diagnosis Date  . Arthritis   . Breast cancer ([place]) 02/2012   Right breast lumpectomy + anti-estrogen therapy.  Mammogram 02/2016 suspicious for recurrence as previous lumpectomy site, but bx done 03/14/16.showed fat necrosis with calcifications and no evidence of malignancy.--repeat mammo recommended 1 yr.  Pt to continue tamoxifen until 03/2017.  No further onc f/u as of 08/2016 f/u.  [name] Chronic renal insufficiency, stage III (moderate) (HCC)    CrCl 45 ml/min  . Fracture of humerus neck 11/09/2017   RIGHT Nondisplaced on ER x-rays, but on f/u ortho x-rays 3 wks later x-rays showed severely impacted and displaced 4 part prox humerus fracture-->plan is for reverese total shoulder arthroplasty 12/2017.  [name] GERD (gastroesophageal reflux disease)   . History of rheumatic fever age 3  . Hyperlipidemia   . Hypertension    Labile; renal arterial Dopplers 11/2012 mild to moderate left renal artery stenosis, does not explain hypertension.  . Hypothyroidism   . Maxillary sinus fracture ([place]) 11/09/2017   Left; sustained in a fall  . Osteoporosis   . Sleep apnea    STOP BANG SCORE 4, never tested for OSA  . Spondylosis of lumbar region without myelopathy or radiculopathy    ESI at L5-S1 helpful 05/2017; Dr. Nelva Bush started opioid pain med contract with her 06/25/17.  . 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 Varicose veins   . Vertigo     DISCHARGE DIAGNOSIS:   Active Problems:   S/P reverse total shoulder arthroplasty, right   PROCEDURE: Procedure(s): REVERSE SHOULDER ARTHROPLASTY on 12/18/2017  CONSULTS:    HISTORY:  See H&P in chart.  HOSPITAL COURSE:  Diane Lucas is a 84 y.o. admitted on 12/18/2017 with a diagnosis of right shoulder 4-part fracture.  They were brought to the operating room on 12/18/2017 and underwent Procedure(s): [place].    They were given perioperative antibiotics:             Anti-infectives (From admission, onward)   Start     Dose/Rate Route Frequency Ordered Stop   12/19/17 0600  ceFAZolin (ANCEF) IVPB 2g/100 mL premix     2 g 200 mL/hr over 30 Minutes Intravenous On call to O.R. 12/18/17 1252 12/18/17 1535    .  Patient underwent the above named procedure and tolerated it well. The following day they were hemodynamically stable and pain was controlled on oral analgesics. They were neurovascularly intact to the operative extremity. OT/PT  was ordered and worked with patient per protocol. She lived alone and after her original injury required care in skilled short term at [facility]. It was felt appropriate for a short term stay there post op They were medically and orthopaedically stable for discharge on day 1 pending approvals .   DIAGNOSTIC STUDIES:  RECENT RADIOGRAPHIC STUDIES :   ImagingResults  No results found.    RECENT VITAL SIGNS:   Patient Vitals for the past 24 hrs:  BP Temp Temp src Pulse Resp SpO2 Height Weight  12/19/17 0318 (!) 110/56 98.3 F (36.8 C) Oral 69 16 97 % - -  12/19/17 0039 (!) 112/45 98.2 F (36.8 C) Oral 74 14 95 % - -  12/18/17 2040 (!) 123/49 (!) 97.5 F (36.4 C) Oral 86 - 96 % - -  12/18/17 1904 132/68 (!) 97.4 F (36.3 C) Oral 70 - 97 % - -  12/18/17 1835 140/63 - - 81 15 97 % - -  12/18/17 1820 135/80 - - 80 15 96 % - -    12/18/17 1805 138/71 - - 82 18 97 % - -  12/18/17 1751 (!) 134/49 97.7 F (36.5 C) - 81 (!) 21 97 % - -  12/18/17 1425 (!) 131/52 - - 74 (!) 24 100 % - -  12/18/17 1420 - - - 71 19 100 % - -  12/18/17 1415 (!) 140/55 - - 72 - 100 % - -  12/18/17 1326 - - - - - - 5' 1.5" (1.562 m) 79.9 kg  12/18/17 1312 (!) 154/48 98.4 F (36.9 C) Oral 75 18 99 % - -  .  RECENT EKG RESULTS:       Orders placed or performed during the hospital encounter of 12/16/17  . EKG 12 lead  . EKG 12 lead  . EKG 12-Lead  . EKG 12-Lead    DISCHARGE INSTRUCTIONS:   DISCHARGE MEDICATIONS:        Allergies as of 12/19/2017      Reactions   Aspirin Other (See Comments)   REACTION: causes bleeding   Benazepril Swelling   TONGUE   Xarelto [rivaroxaban] Other (See Comments)   EPISTAXIS   Amlodipine Swelling   SWELLING REACTION UNSPECIFIED    Minocycline Swelling   SWELLING REACTION UNSPECIFIED          Medication List    TAKE these medications   acetaminophen 500 MG tablet Commonly known as:  TYLENOL Take 1-2 tablets (500-1,000 mg total) by mouth every 6 (six) hours as needed for mild pain, moderate pain or headache. Take 2 tablet twice a day What changed:    how much to take  when to take this   BIOTIN PO Take 1 tablet by mouth daily.   calcium-vitamin D 500-200 MG-UNIT tablet Take 2 tablets by mouth daily.   Cholecalciferol 25 MCG (1000 UT) tablet Take 1,000 Units by mouth daily.   levothyroxine 50 MCG tablet Commonly known as:  SYNTHROID, LEVOTHROID TAKE ONE TABLET BY MOUTH BEFORE BREAKFAST What changed:    how much to take  how to take this  when to take this  additional instructions   Magnesium 250 MG Tabs Take 250 mg by mouth 2 (two) times daily.   MELATONIN PO Take 1 tablet by mouth at bedtime as needed (sleep).   omeprazole 20 MG capsule Commonly known as:  PRILOSEC TAKE ONE CAPSULE BY MOUTH DAILY   simvastatin 10 MG tablet Commonly  known as:  ZOCOR TAKE  ONE TABLET BY MOUTH AT BEDTIME   traMADol 50 MG tablet Commonly known as:  ULTRAM Take 1 tablet (50 mg total) by mouth every 6 (six) hours as needed for moderate pain.   valsartan-hydrochlorothiazide 160-25 MG tablet Commonly known as:  DIOVAN-HCT TAKE ONE TABLET BY MOUTH DAILY   VITAMIN B-6 PO Take 1 tablet by mouth daily.       OT please work with patient :  PROM 10 ER, 45 ABD,60 FE , PASSIVE ROM FOR ADL's ONLY, NOT for EXERCISES. OK to exercise elbow wrist and hand rom and for edema control No pendulums, may allow arm to dangle Pt may shower  Leave current dressing intact until FU  Call to arrange FU appt   FOLLOW UP VISIT:      Follow-up Information    Justice Britain, MD.   Specialty:  Orthopedic Surgery Why:  call to be seen in 10-14 days Contact information: [address] [place] [address] [phone]           DISCHARGE TO: Skilled   DISCHARGE CONDITION:  Thereasa Parkin Onesha Krebbs for Dr. Justice Britain 12/19/2017, 9:03 AM

## 2017-12-22 NOTE — Progress Notes (Signed)
Patient will DC to: [facility] Anticipated DC date: 12/22/17 Family notified: at bedside Transport by: family at bedside  Per MD patient ready for DC to [facility] . RN, patient, patient's family, and facility notified of DC. Discharge Summary sent to facility. RN given number for report [phone] . DC packet on chart. Ambulance transport requested for patient.  CSW signing off.  [place], [place]

## 2017-12-22 NOTE — Progress Notes (Signed)
Physical Therapy Treatment Patient Details Name: Diane Lucas MRN: [id number] DOB: [birth year] Today's Date: 12/22/2017    History of Present Illness Diane Lucas is an 84yo admitted on 12/18/2017 with a diagnosis of right shoulder 4-part fracture.  Now s/p  REVERSE SHOULDER ARTHROPLASTY. Pt sustained a fall with significant shoulder injury back in Nov 2019, has a short stay at [facility] to improve mobility/strength/safety, but still quite limited. Pt lives alone and reports to have had 4 significant falls in the past year.     PT Comments    Pt up in chair at start of treatment, reports she is feeling much better today. Pt completing transfers min guard and ambulation hands on min guard for safety. Pt has decreased dual tasking during ambulation, noted by one LOB while talking and making a right turn. She reports her leg strength "is not back to normal yet" and is eager for additional rehab to return to PLOF. DC plan remains appropriate at this time, PT will continue to follow acutely.   Follow Up Recommendations  SNF;Supervision for mobility/OOB;Supervision - Intermittent     Equipment Recommendations  Cane       Precautions / Restrictions Precautions Precautions: Fall;Shoulder Shoulder Interventions: Shoulder sling/immobilizer;At all times;Off for dressing/bathing/exercises Precaution Booklet Issued: Yes (comment) Required Braces or Orthoses: Sling Restrictions Weight Bearing Restrictions: Yes RUE Weight Bearing: Non weight bearing    Mobility  Bed Mobility               General bed mobility comments: Up in chair upon entry   Transfers Overall transfer level: Needs assistance Equipment used: None Transfers: Sit to/from Stand Sit to Stand: Min guard         General transfer comment: Min guard for safety, pt completing sit to stand with use of LUE pushing off from chair, completed once at start of mobility, 5x at end of session, no apparent LOB during transfers, pt reports  fatigue at end of 5 sit to stands.   Ambulation/Gait Ambulation/Gait assistance: Min guard Gait Distance (Feet): 140 Feet Assistive device: Straight cane Gait Pattern/deviations: Step-through pattern;Decreased step length - left   Gait velocity interpretation: <1.8 ft/sec, indicate of risk for recurrent falls General Gait Details: hands on min guard for safety, pt unsteady at times with one moment of LOB while turning right and talking, pt stopped and regained her balance before proceeding. Pt has decreased floor clearance with occasional shuffling steps, good use of SPC with LUE. vc to look forward, and to increase floor clearance.       Balance Overall balance assessment: Needs assistance Sitting-balance support: Single extremity supported;Feet supported Sitting balance-Leahy Scale: Fair     Standing balance support: Single extremity supported;During functional activity Standing balance-Leahy Scale: Fair Standing balance comment: SPC for stability                            Cognition Arousal/Alertness: Awake/alert Behavior During Therapy: WFL for tasks assessed/performed Overall Cognitive Status: Within Functional Limits for tasks assessed                                        Exercises General Exercises - Lower Extremity Long Arc Quad: AROM;Both;5 reps;Seated Hip Flexion/Marching: AROM;Both;5 reps;Seated Other Exercises Other Exercises: Demonstrated digit composite flexion/extension, reports she has completed e/w/h exercises today. Other Exercises: Pt completed sit to stand 5x  General Comments  Pt reports discomfort with sling strap around her neck, washcloth placed underneath for comfort. Mild edema present in right hand.       Pertinent Vitals/Pain Pain Assessment: Faces Faces Pain Scale: Hurts a little bit Pain Location: right shoulder Pain Descriptors / Indicators: Sore(mild pain only with movement) Pain Intervention(s): Limited  activity within patient's tolerance;Monitored during session;Premedicated before session           PT Goals (current goals can now be found in the care plan section) Acute Rehab PT Goals Patient Stated Goal: rehab then home. regain independence. have a vacation. PT Goal Formulation: With patient Time For Goal Achievement: 12/26/17 Potential to Achieve Goals: Good Progress towards PT goals: Progressing toward goals    Frequency    Min 3X/week      PT Plan Current plan remains appropriate       AM-PAC PT "6 Clicks" Mobility   Outcome Measure  Help needed turning from your back to your side while in a flat bed without using bedrails?: A Little Help needed moving from lying on your back to sitting on the side of a flat bed without using bedrails?: A Little Help needed moving to and from a bed to a chair (including a wheelchair)?: A Little Help needed standing up from a chair using your arms (e.g., wheelchair or bedside chair)?: None Help needed to walk in hospital room?: A Little Help needed climbing 3-5 steps with a railing? : A Lot 6 Click Score: 18    End of Session Equipment Utilized During Treatment: Gait belt Activity Tolerance: Patient tolerated treatment well Patient left: in chair;with call bell/phone within reach;with family/visitor present   PT Visit Diagnosis: Unsteadiness on feet (R26.81);Other abnormalities of gait and mobility (R26.89);History of falling (Z91.81);Difficulty in walking, not elsewhere classified (R26.2)     Time: 7408-1448 PT Time Calculation (min) (ACUTE ONLY): 16 min  Charges:  $Gait Training: 8-22 mins                     Vernell Morgans, SPT Acute Rehabilitation Services Office [phone]    Vernell Morgans 12/22/2017, 1:24 PM

## 2017-12-22 NOTE — Clinical Social Work Placement (Signed)
   CLINICAL SOCIAL WORK PLACEMENT  NOTE  Date:  12/22/2017  Patient Details  Name: Diane Lucas MRN: [id number] Date of Birth: Dec 18, 1930  Clinical Social Work is seeking post-discharge placement for this patient at the Mars Shreeya Recendiz level of care (*CSW will initial, date and re-position this form in  chart as items are completed):      Patient/family provided with [place] Work Department's list of facilities offering this level of care within the geographic area requested by the patient (or if unable, by the patient's family).  Yes   Patient/family informed of their freedom to choose among providers that offer the needed level of care, that participate in Medicare, Medicaid or managed care program needed by the patient, have an available bed and are willing to accept the patient.      Patient/family informed of [place]'s ownership interest in [facility] and [facility], as well as of the fact that they are under no obligation to receive care at these facilities.  PASRR submitted to EDS on       PASRR number received on 12/19/17     Existing PASRR number confirmed on       FL2 transmitted to all facilities in geographic area requested by pt/family on 12/19/17     FL2 transmitted to all facilities within larger geographic area on       Patient informed that his/her managed care company has contracts with or will negotiate with certain facilities, including the following:        Yes   Patient/family informed of bed offers received.  Patient chooses bed at Other - please specify in the comment section below:([facility])     Physician recommends and patient chooses bed at      Patient to be transferred to Other - please specify in the comment section below:([facility]) on 12/22/17.  Patient to be transferred to facility by family     Patient family notified on 12/22/17 of transfer.  Name of family member notified:  family at bedside      PHYSICIAN       Additional Comment:    _______________________________________________ Alberteen Sam, LCSW 12/22/2017, 4:05 PM

## 2017-12-22 NOTE — Progress Notes (Signed)
Pt given written and oral discharge instructions. Given scripts for medications. Removed peripheral IV and pt tolerated well. Assisted to car by staff with x3 family members.

## 2017-12-22 NOTE — Progress Notes (Signed)
Occupational Therapy Treatment Patient Details Name: Diane Lucas MRN: [id number] DOB: [birth year] Today's Date: 12/22/2017    History of present illness Diane Lucas is an 84yo admitted on 12/18/2017 with a diagnosis of right shoulder 4-part fracture.  Now s/p  REVERSE SHOULDER ARTHROPLASTY. Pt sustained a fall with significant shoulder injury back in Nov 2019, has a short stay at [facility] to improve mobility/strength/safety, but still quite limited. Pt lives alone and reports to have had 4 significant falls in the past year.    OT comments  Pt progressing towards OT goals this session. Able to complete hand, wrist, forearm exercises, required encouragement for elbow AROM. Sling re-adjusted afterwards and went for a walk with daughter assisting. Pt continues to require significant assist for ADL and BUE tasks. Current POC remains appropriate and essential for safety and to maximize independence in ADL and functional transfers.    Follow Up Recommendations  SNF    Equipment Recommendations  Other (comment)(defer to next venue)    Recommendations for Other Services      Precautions / Restrictions Precautions Precautions: Fall;Shoulder Shoulder Interventions: Shoulder sling/immobilizer;At all times;Off for dressing/bathing/exercises Precaution Booklet Issued: Yes (comment) Required Braces or Orthoses: Sling Restrictions Weight Bearing Restrictions: Yes RUE Weight Bearing: Non weight bearing       Mobility Bed Mobility               General bed mobility comments: Up in chair upon entry   Transfers Overall transfer level: Needs assistance Equipment used: None Transfers: Sit to/from Stand Sit to Stand: Min guard         General transfer comment: Min guard for safety, pt completing sit to stand with use of LUE pushing off from chair, completed once at start of mobility, 5x at end of session, no apparent LOB during transfers, pt reports fatigue at end of 5 sit to stands.      Balance Overall balance assessment: Needs assistance Sitting-balance support: Single extremity supported;Feet supported Sitting balance-Leahy Scale: Fair     Standing balance support: Single extremity supported;During functional activity Standing balance-Leahy Scale: Fair Standing balance comment: SPC for stability                           ADL either performed or assessed with clinical judgement   ADL                                               Vision       Perception     Praxis      Cognition Arousal/Alertness: Awake/alert Behavior During Therapy: WFL for tasks assessed/performed Overall Cognitive Status: Within Functional Limits for tasks assessed                                          Exercises Exercises: Shoulder General Exercises - Lower Extremity Long Arc Quad: AROM;Both;5 reps;Seated Hip Flexion/Marching: AROM;Both;5 reps;Seated Shoulder Exercises Elbow Flexion: AROM;10 reps;Right;Seated Elbow Extension: AROM;10 reps;Right;Seated Wrist Flexion: AROM;10 reps;Right;Seated Wrist Extension: AROM;10 reps;Right;Seated Digit Composite Flexion: AROM;10 reps;Right;Seated Composite Extension: AAROM;10 reps;Right;Seated Neck Flexion: AROM Neck Extension: AROM Neck Lateral Flexion - Right: AROM Neck Lateral Flexion - Left: AROM Other Exercises Other Exercises: Demonstrated digit composite flexion/extension, reports she has completed  e/w/h exercises today. Other Exercises: Pt completed a lap of unit at min guard   Shoulder Instructions Shoulder Instructions Donning/doffing shirt without moving shoulder: Maximal assistance Method for sponge bathing under operated UE: Moderate assistance Donning/doffing sling/immobilizer: Maximal assistance Correct positioning of sling/immobilizer: Moderate assistance ROM for elbow, wrist and digits of operated UE: Supervision/safety Sling wearing schedule (on at all times/off for  ADL's): Independent Proper positioning of operated UE when showering: Min-guard Positioning of UE while sleeping: Min-guard     General Comments      Pertinent Vitals/ Pain       Pain Assessment: Faces Faces Pain Scale: Hurts even more Pain Location: right shoulder Pain Descriptors / Indicators: Sore(mild pain only with movement) Pain Intervention(s): Monitored during session;Repositioned  Home Living                                          Prior Functioning/Environment              Frequency  Min 3X/week        Progress Toward Goals  OT Goals(current goals can now be found in the care plan section)  Progress towards OT goals: Progressing toward goals  Acute Rehab OT Goals Patient Stated Goal: rehab then home. regain independence. have a vacation. OT Goal Formulation: With patient Time For Goal Achievement: 12/26/17 Potential to Achieve Goals: Good  Plan Discharge plan remains appropriate;Frequency remains appropriate    Co-evaluation                 AM-PAC OT "6 Clicks" Daily Activity     Outcome Measure   Help from another person eating meals?: A Little Help from another person taking care of personal grooming?: A Lot Help from another person toileting, which includes using toliet, bedpan, or urinal?: A Little Help from another person bathing (including washing, rinsing, drying)?: A Lot Help from another person to put on and taking off regular upper body clothing?: A Lot Help from another person to put on and taking off regular lower body clothing?: A Little 6 Click Score: 15    End of Session Equipment Utilized During Treatment: Gait belt;Other (comment)(sling)  OT Visit Diagnosis: Unsteadiness on feet (R26.81);Pain Pain - Right/Left: Right Pain - part of body: Shoulder   Activity Tolerance Patient tolerated treatment well   Patient Left in chair;with call bell/phone within reach;with family/visitor present   Nurse  Communication Mobility status;Weight bearing status        Time: 1340-1405 OT Time Calculation (min): 25 min  Charges: OT General Charges $OT Visit: 1 Visit OT Treatments $Self Care/Home Management : 8-22 mins $Therapeutic Exercise: 8-22 mins  Hulda Humphrey OTR/L Acute Rehabilitation Services Pager: [phone] Office: [place] 12/22/2017, 2:35 PM

## 2017-12-22 NOTE — Care Management Important Message (Signed)
Important Message  Patient Details  Name: Diane Lucas MRN: [id number] Date of Birth: 02-Apr-1930   Medicare Important Message Given:  Yes    Chaka Boyson [address] 12/22/2017, 4:35 PM

## 2017-12-31 DIAGNOSIS — Z471 Aftercare following joint replacement surgery: Secondary | ICD-10-CM | POA: Diagnosis not present

## 2017-12-31 DIAGNOSIS — Z96611 Presence of right artificial shoulder joint: Secondary | ICD-10-CM | POA: Diagnosis not present

## 2018-01-01 DIAGNOSIS — S42241S 4-part fracture of surgical neck of right humerus, sequela: Secondary | ICD-10-CM | POA: Diagnosis not present

## 2018-01-01 DIAGNOSIS — I10 Essential (primary) hypertension: Secondary | ICD-10-CM | POA: Diagnosis not present

## 2018-01-01 DIAGNOSIS — E039 Hypothyroidism, unspecified: Secondary | ICD-10-CM | POA: Diagnosis not present

## 2018-01-01 DIAGNOSIS — Z7409 Other reduced mobility: Secondary | ICD-10-CM | POA: Diagnosis not present

## 2018-01-12 DIAGNOSIS — I10 Essential (primary) hypertension: Secondary | ICD-10-CM | POA: Diagnosis not present

## 2018-01-12 DIAGNOSIS — S42241S 4-part fracture of surgical neck of right humerus, sequela: Secondary | ICD-10-CM | POA: Diagnosis not present

## 2018-01-12 DIAGNOSIS — E039 Hypothyroidism, unspecified: Secondary | ICD-10-CM | POA: Diagnosis not present

## 2018-01-12 DIAGNOSIS — E785 Hyperlipidemia, unspecified: Secondary | ICD-10-CM | POA: Diagnosis not present

## 2018-01-14 HISTORY — PX: OTHER SURGICAL HISTORY: SHX169

## 2018-01-17 DIAGNOSIS — M80021D Age-related osteoporosis with current pathological fracture, right humerus, subsequent encounter for fracture with routine healing: Secondary | ICD-10-CM | POA: Diagnosis not present

## 2018-01-17 DIAGNOSIS — E1122 Type 2 diabetes mellitus with diabetic chronic kidney disease: Secondary | ICD-10-CM | POA: Diagnosis not present

## 2018-01-17 DIAGNOSIS — K219 Gastro-esophageal reflux disease without esophagitis: Secondary | ICD-10-CM | POA: Diagnosis not present

## 2018-01-17 DIAGNOSIS — Z96611 Presence of right artificial shoulder joint: Secondary | ICD-10-CM | POA: Diagnosis not present

## 2018-01-17 DIAGNOSIS — E785 Hyperlipidemia, unspecified: Secondary | ICD-10-CM | POA: Diagnosis not present

## 2018-01-17 DIAGNOSIS — M47816 Spondylosis without myelopathy or radiculopathy, lumbar region: Secondary | ICD-10-CM | POA: Diagnosis not present

## 2018-01-17 DIAGNOSIS — Z96641 Presence of right artificial hip joint: Secondary | ICD-10-CM | POA: Diagnosis not present

## 2018-01-17 DIAGNOSIS — E039 Hypothyroidism, unspecified: Secondary | ICD-10-CM | POA: Diagnosis not present

## 2018-01-17 DIAGNOSIS — N183 Chronic kidney disease, stage 3 (moderate): Secondary | ICD-10-CM | POA: Diagnosis not present

## 2018-01-17 DIAGNOSIS — Z853 Personal history of malignant neoplasm of breast: Secondary | ICD-10-CM | POA: Diagnosis not present

## 2018-01-19 ENCOUNTER — Telehealth: Payer: Self-pay | Admitting: *Deleted

## 2018-01-19 DIAGNOSIS — Z96611 Presence of right artificial shoulder joint: Secondary | ICD-10-CM | POA: Diagnosis not present

## 2018-01-19 DIAGNOSIS — K219 Gastro-esophageal reflux disease without esophagitis: Secondary | ICD-10-CM | POA: Diagnosis not present

## 2018-01-19 DIAGNOSIS — Z853 Personal history of malignant neoplasm of breast: Secondary | ICD-10-CM | POA: Diagnosis not present

## 2018-01-19 DIAGNOSIS — M80021D Age-related osteoporosis with current pathological fracture, right humerus, subsequent encounter for fracture with routine healing: Secondary | ICD-10-CM | POA: Diagnosis not present

## 2018-01-19 DIAGNOSIS — E785 Hyperlipidemia, unspecified: Secondary | ICD-10-CM | POA: Diagnosis not present

## 2018-01-19 DIAGNOSIS — E1122 Type 2 diabetes mellitus with diabetic chronic kidney disease: Secondary | ICD-10-CM | POA: Diagnosis not present

## 2018-01-19 DIAGNOSIS — Z96641 Presence of right artificial hip joint: Secondary | ICD-10-CM | POA: Diagnosis not present

## 2018-01-19 DIAGNOSIS — M47816 Spondylosis without myelopathy or radiculopathy, lumbar region: Secondary | ICD-10-CM | POA: Diagnosis not present

## 2018-01-19 DIAGNOSIS — N183 Chronic kidney disease, stage 3 (moderate): Secondary | ICD-10-CM | POA: Diagnosis not present

## 2018-01-19 DIAGNOSIS — E039 Hypothyroidism, unspecified: Secondary | ICD-10-CM | POA: Diagnosis not present

## 2018-01-19 NOTE — Telephone Encounter (Signed)
SW [place], she stated that she checked pts BP on Saturday and it was 95/40 pt was asymptomatic and had taken her BP medication. Pt told Moshe Salisbury that her BP has been running low for a while even when she was in the rehab/nursing home.   Care wants to know does pt need to stop BP medication and is it okay for her to continue therapy with BP that low?  Please advise. Thanks.

## 2018-01-19 NOTE — Telephone Encounter (Signed)
Stop bp med. Check bp and heart rate 1-2 times per day and write numbers down so she can bring them in to review with me when I see her Jan 14. Only do PT if BP >105 over 60.-thx

## 2018-01-19 NOTE — Telephone Encounter (Signed)
Copied from [place] [phone]. Topic: General - Inquiry >> Jan 19, 2018 12:55 PM Judyann Munson wrote: Reason for CRM: cara-  physical threapy ([facility]) say on saturday 01-17-2018  bp running low 95/40, she said the patient stated in the nursing home her BP was running low. she is calling to advise.

## 2018-01-19 NOTE — Telephone Encounter (Signed)
Moshe Salisbury advised and voiced understanding, she will contact pt to let her know of changes and recommendations.

## 2018-01-20 DIAGNOSIS — E785 Hyperlipidemia, unspecified: Secondary | ICD-10-CM | POA: Diagnosis not present

## 2018-01-20 DIAGNOSIS — M80021D Age-related osteoporosis with current pathological fracture, right humerus, subsequent encounter for fracture with routine healing: Secondary | ICD-10-CM | POA: Diagnosis not present

## 2018-01-20 DIAGNOSIS — Z96641 Presence of right artificial hip joint: Secondary | ICD-10-CM | POA: Diagnosis not present

## 2018-01-20 DIAGNOSIS — E039 Hypothyroidism, unspecified: Secondary | ICD-10-CM | POA: Diagnosis not present

## 2018-01-20 DIAGNOSIS — E1122 Type 2 diabetes mellitus with diabetic chronic kidney disease: Secondary | ICD-10-CM | POA: Diagnosis not present

## 2018-01-20 DIAGNOSIS — N183 Chronic kidney disease, stage 3 (moderate): Secondary | ICD-10-CM | POA: Diagnosis not present

## 2018-01-20 DIAGNOSIS — K219 Gastro-esophageal reflux disease without esophagitis: Secondary | ICD-10-CM | POA: Diagnosis not present

## 2018-01-20 DIAGNOSIS — Z96611 Presence of right artificial shoulder joint: Secondary | ICD-10-CM | POA: Diagnosis not present

## 2018-01-20 DIAGNOSIS — Z853 Personal history of malignant neoplasm of breast: Secondary | ICD-10-CM | POA: Diagnosis not present

## 2018-01-20 DIAGNOSIS — M47816 Spondylosis without myelopathy or radiculopathy, lumbar region: Secondary | ICD-10-CM | POA: Diagnosis not present

## 2018-01-21 DIAGNOSIS — E1122 Type 2 diabetes mellitus with diabetic chronic kidney disease: Secondary | ICD-10-CM | POA: Diagnosis not present

## 2018-01-21 DIAGNOSIS — E785 Hyperlipidemia, unspecified: Secondary | ICD-10-CM | POA: Diagnosis not present

## 2018-01-21 DIAGNOSIS — M47816 Spondylosis without myelopathy or radiculopathy, lumbar region: Secondary | ICD-10-CM | POA: Diagnosis not present

## 2018-01-21 DIAGNOSIS — Z96641 Presence of right artificial hip joint: Secondary | ICD-10-CM | POA: Diagnosis not present

## 2018-01-21 DIAGNOSIS — K219 Gastro-esophageal reflux disease without esophagitis: Secondary | ICD-10-CM | POA: Diagnosis not present

## 2018-01-21 DIAGNOSIS — N183 Chronic kidney disease, stage 3 (moderate): Secondary | ICD-10-CM | POA: Diagnosis not present

## 2018-01-21 DIAGNOSIS — M80021D Age-related osteoporosis with current pathological fracture, right humerus, subsequent encounter for fracture with routine healing: Secondary | ICD-10-CM | POA: Diagnosis not present

## 2018-01-21 DIAGNOSIS — Z853 Personal history of malignant neoplasm of breast: Secondary | ICD-10-CM | POA: Diagnosis not present

## 2018-01-21 DIAGNOSIS — Z96611 Presence of right artificial shoulder joint: Secondary | ICD-10-CM | POA: Diagnosis not present

## 2018-01-21 DIAGNOSIS — E039 Hypothyroidism, unspecified: Secondary | ICD-10-CM | POA: Diagnosis not present

## 2018-01-23 DIAGNOSIS — Z96611 Presence of right artificial shoulder joint: Secondary | ICD-10-CM | POA: Diagnosis not present

## 2018-01-23 DIAGNOSIS — K219 Gastro-esophageal reflux disease without esophagitis: Secondary | ICD-10-CM | POA: Diagnosis not present

## 2018-01-23 DIAGNOSIS — Z853 Personal history of malignant neoplasm of breast: Secondary | ICD-10-CM | POA: Diagnosis not present

## 2018-01-23 DIAGNOSIS — M47816 Spondylosis without myelopathy or radiculopathy, lumbar region: Secondary | ICD-10-CM | POA: Diagnosis not present

## 2018-01-23 DIAGNOSIS — E1122 Type 2 diabetes mellitus with diabetic chronic kidney disease: Secondary | ICD-10-CM | POA: Diagnosis not present

## 2018-01-23 DIAGNOSIS — N183 Chronic kidney disease, stage 3 (moderate): Secondary | ICD-10-CM | POA: Diagnosis not present

## 2018-01-23 DIAGNOSIS — Z96641 Presence of right artificial hip joint: Secondary | ICD-10-CM | POA: Diagnosis not present

## 2018-01-23 DIAGNOSIS — E039 Hypothyroidism, unspecified: Secondary | ICD-10-CM | POA: Diagnosis not present

## 2018-01-23 DIAGNOSIS — E785 Hyperlipidemia, unspecified: Secondary | ICD-10-CM | POA: Diagnosis not present

## 2018-01-23 DIAGNOSIS — M80021D Age-related osteoporosis with current pathological fracture, right humerus, subsequent encounter for fracture with routine healing: Secondary | ICD-10-CM | POA: Diagnosis not present

## 2018-01-26 DIAGNOSIS — E785 Hyperlipidemia, unspecified: Secondary | ICD-10-CM | POA: Diagnosis not present

## 2018-01-26 DIAGNOSIS — E1122 Type 2 diabetes mellitus with diabetic chronic kidney disease: Secondary | ICD-10-CM | POA: Diagnosis not present

## 2018-01-26 DIAGNOSIS — N183 Chronic kidney disease, stage 3 (moderate): Secondary | ICD-10-CM | POA: Diagnosis not present

## 2018-01-26 DIAGNOSIS — Z96611 Presence of right artificial shoulder joint: Secondary | ICD-10-CM | POA: Diagnosis not present

## 2018-01-26 DIAGNOSIS — Z853 Personal history of malignant neoplasm of breast: Secondary | ICD-10-CM | POA: Diagnosis not present

## 2018-01-26 DIAGNOSIS — M47816 Spondylosis without myelopathy or radiculopathy, lumbar region: Secondary | ICD-10-CM | POA: Diagnosis not present

## 2018-01-26 DIAGNOSIS — K219 Gastro-esophageal reflux disease without esophagitis: Secondary | ICD-10-CM | POA: Diagnosis not present

## 2018-01-26 DIAGNOSIS — E039 Hypothyroidism, unspecified: Secondary | ICD-10-CM | POA: Diagnosis not present

## 2018-01-26 DIAGNOSIS — Z96641 Presence of right artificial hip joint: Secondary | ICD-10-CM | POA: Diagnosis not present

## 2018-01-26 DIAGNOSIS — M80021D Age-related osteoporosis with current pathological fracture, right humerus, subsequent encounter for fracture with routine healing: Secondary | ICD-10-CM | POA: Diagnosis not present

## 2018-01-27 ENCOUNTER — Ambulatory Visit: Payer: Medicare Other | Admitting: Family Medicine

## 2018-01-27 DIAGNOSIS — Z853 Personal history of malignant neoplasm of breast: Secondary | ICD-10-CM | POA: Diagnosis not present

## 2018-01-27 DIAGNOSIS — E785 Hyperlipidemia, unspecified: Secondary | ICD-10-CM | POA: Diagnosis not present

## 2018-01-27 DIAGNOSIS — Z96641 Presence of right artificial hip joint: Secondary | ICD-10-CM | POA: Diagnosis not present

## 2018-01-27 DIAGNOSIS — M47816 Spondylosis without myelopathy or radiculopathy, lumbar region: Secondary | ICD-10-CM | POA: Diagnosis not present

## 2018-01-27 DIAGNOSIS — N183 Chronic kidney disease, stage 3 (moderate): Secondary | ICD-10-CM | POA: Diagnosis not present

## 2018-01-27 DIAGNOSIS — M80021D Age-related osteoporosis with current pathological fracture, right humerus, subsequent encounter for fracture with routine healing: Secondary | ICD-10-CM | POA: Diagnosis not present

## 2018-01-27 DIAGNOSIS — E039 Hypothyroidism, unspecified: Secondary | ICD-10-CM | POA: Diagnosis not present

## 2018-01-27 DIAGNOSIS — K219 Gastro-esophageal reflux disease without esophagitis: Secondary | ICD-10-CM | POA: Diagnosis not present

## 2018-01-27 DIAGNOSIS — Z96611 Presence of right artificial shoulder joint: Secondary | ICD-10-CM | POA: Diagnosis not present

## 2018-01-27 DIAGNOSIS — E1122 Type 2 diabetes mellitus with diabetic chronic kidney disease: Secondary | ICD-10-CM | POA: Diagnosis not present

## 2018-01-29 DIAGNOSIS — Z96641 Presence of right artificial hip joint: Secondary | ICD-10-CM | POA: Diagnosis not present

## 2018-01-29 DIAGNOSIS — N183 Chronic kidney disease, stage 3 (moderate): Secondary | ICD-10-CM | POA: Diagnosis not present

## 2018-01-29 DIAGNOSIS — M47816 Spondylosis without myelopathy or radiculopathy, lumbar region: Secondary | ICD-10-CM | POA: Diagnosis not present

## 2018-01-29 DIAGNOSIS — Z853 Personal history of malignant neoplasm of breast: Secondary | ICD-10-CM | POA: Diagnosis not present

## 2018-01-29 DIAGNOSIS — E785 Hyperlipidemia, unspecified: Secondary | ICD-10-CM | POA: Diagnosis not present

## 2018-01-29 DIAGNOSIS — E1122 Type 2 diabetes mellitus with diabetic chronic kidney disease: Secondary | ICD-10-CM | POA: Diagnosis not present

## 2018-01-29 DIAGNOSIS — E039 Hypothyroidism, unspecified: Secondary | ICD-10-CM | POA: Diagnosis not present

## 2018-01-29 DIAGNOSIS — M80021D Age-related osteoporosis with current pathological fracture, right humerus, subsequent encounter for fracture with routine healing: Secondary | ICD-10-CM | POA: Diagnosis not present

## 2018-01-29 DIAGNOSIS — Z96611 Presence of right artificial shoulder joint: Secondary | ICD-10-CM | POA: Diagnosis not present

## 2018-01-29 DIAGNOSIS — K219 Gastro-esophageal reflux disease without esophagitis: Secondary | ICD-10-CM | POA: Diagnosis not present

## 2018-01-30 DIAGNOSIS — Z4789 Encounter for other orthopedic aftercare: Secondary | ICD-10-CM | POA: Diagnosis not present

## 2018-02-02 ENCOUNTER — Other Ambulatory Visit: Payer: Self-pay | Admitting: Family Medicine

## 2018-02-02 DIAGNOSIS — E785 Hyperlipidemia, unspecified: Secondary | ICD-10-CM | POA: Diagnosis not present

## 2018-02-02 DIAGNOSIS — Z96641 Presence of right artificial hip joint: Secondary | ICD-10-CM | POA: Diagnosis not present

## 2018-02-02 DIAGNOSIS — N183 Chronic kidney disease, stage 3 (moderate): Secondary | ICD-10-CM | POA: Diagnosis not present

## 2018-02-02 DIAGNOSIS — M47816 Spondylosis without myelopathy or radiculopathy, lumbar region: Secondary | ICD-10-CM | POA: Diagnosis not present

## 2018-02-02 DIAGNOSIS — K219 Gastro-esophageal reflux disease without esophagitis: Secondary | ICD-10-CM | POA: Diagnosis not present

## 2018-02-02 DIAGNOSIS — M80021D Age-related osteoporosis with current pathological fracture, right humerus, subsequent encounter for fracture with routine healing: Secondary | ICD-10-CM | POA: Diagnosis not present

## 2018-02-02 DIAGNOSIS — Z853 Personal history of malignant neoplasm of breast: Secondary | ICD-10-CM | POA: Diagnosis not present

## 2018-02-02 DIAGNOSIS — E1122 Type 2 diabetes mellitus with diabetic chronic kidney disease: Secondary | ICD-10-CM | POA: Diagnosis not present

## 2018-02-02 DIAGNOSIS — E039 Hypothyroidism, unspecified: Secondary | ICD-10-CM | POA: Diagnosis not present

## 2018-02-02 DIAGNOSIS — Z96611 Presence of right artificial shoulder joint: Secondary | ICD-10-CM | POA: Diagnosis not present

## 2018-02-04 ENCOUNTER — Encounter: Payer: Self-pay | Admitting: Family Medicine

## 2018-02-04 ENCOUNTER — Inpatient Hospital Stay: Payer: Medicare Other | Admitting: Family Medicine

## 2018-02-04 DIAGNOSIS — Z853 Personal history of malignant neoplasm of breast: Secondary | ICD-10-CM | POA: Diagnosis not present

## 2018-02-04 DIAGNOSIS — K219 Gastro-esophageal reflux disease without esophagitis: Secondary | ICD-10-CM | POA: Diagnosis not present

## 2018-02-04 DIAGNOSIS — I129 Hypertensive chronic kidney disease with stage 1 through stage 4 chronic kidney disease, or unspecified chronic kidney disease: Secondary | ICD-10-CM | POA: Diagnosis not present

## 2018-02-04 DIAGNOSIS — M80021D Age-related osteoporosis with current pathological fracture, right humerus, subsequent encounter for fracture with routine healing: Secondary | ICD-10-CM | POA: Diagnosis not present

## 2018-02-04 DIAGNOSIS — E039 Hypothyroidism, unspecified: Secondary | ICD-10-CM | POA: Diagnosis not present

## 2018-02-04 DIAGNOSIS — Z96611 Presence of right artificial shoulder joint: Secondary | ICD-10-CM | POA: Diagnosis not present

## 2018-02-04 DIAGNOSIS — E785 Hyperlipidemia, unspecified: Secondary | ICD-10-CM | POA: Diagnosis not present

## 2018-02-04 DIAGNOSIS — N183 Chronic kidney disease, stage 3 (moderate): Secondary | ICD-10-CM | POA: Diagnosis not present

## 2018-02-04 DIAGNOSIS — E1122 Type 2 diabetes mellitus with diabetic chronic kidney disease: Secondary | ICD-10-CM | POA: Diagnosis not present

## 2018-02-04 DIAGNOSIS — S42301D Unspecified fracture of shaft of humerus, right arm, subsequent encounter for fracture with routine healing: Secondary | ICD-10-CM | POA: Diagnosis not present

## 2018-02-04 DIAGNOSIS — W19XXXD Unspecified fall, subsequent encounter: Secondary | ICD-10-CM | POA: Diagnosis not present

## 2018-02-04 DIAGNOSIS — Z96641 Presence of right artificial hip joint: Secondary | ICD-10-CM | POA: Diagnosis not present

## 2018-02-04 DIAGNOSIS — M47816 Spondylosis without myelopathy or radiculopathy, lumbar region: Secondary | ICD-10-CM | POA: Diagnosis not present

## 2018-02-05 DIAGNOSIS — Z96641 Presence of right artificial hip joint: Secondary | ICD-10-CM | POA: Diagnosis not present

## 2018-02-05 DIAGNOSIS — Z96611 Presence of right artificial shoulder joint: Secondary | ICD-10-CM | POA: Diagnosis not present

## 2018-02-05 DIAGNOSIS — N183 Chronic kidney disease, stage 3 (moderate): Secondary | ICD-10-CM | POA: Diagnosis not present

## 2018-02-05 DIAGNOSIS — E1122 Type 2 diabetes mellitus with diabetic chronic kidney disease: Secondary | ICD-10-CM | POA: Diagnosis not present

## 2018-02-05 DIAGNOSIS — E039 Hypothyroidism, unspecified: Secondary | ICD-10-CM | POA: Diagnosis not present

## 2018-02-05 DIAGNOSIS — Z853 Personal history of malignant neoplasm of breast: Secondary | ICD-10-CM | POA: Diagnosis not present

## 2018-02-05 DIAGNOSIS — K219 Gastro-esophageal reflux disease without esophagitis: Secondary | ICD-10-CM | POA: Diagnosis not present

## 2018-02-05 DIAGNOSIS — M80021D Age-related osteoporosis with current pathological fracture, right humerus, subsequent encounter for fracture with routine healing: Secondary | ICD-10-CM | POA: Diagnosis not present

## 2018-02-05 DIAGNOSIS — M47816 Spondylosis without myelopathy or radiculopathy, lumbar region: Secondary | ICD-10-CM | POA: Diagnosis not present

## 2018-02-05 DIAGNOSIS — E785 Hyperlipidemia, unspecified: Secondary | ICD-10-CM | POA: Diagnosis not present

## 2018-02-06 DIAGNOSIS — Z96641 Presence of right artificial hip joint: Secondary | ICD-10-CM | POA: Diagnosis not present

## 2018-02-06 DIAGNOSIS — N183 Chronic kidney disease, stage 3 (moderate): Secondary | ICD-10-CM | POA: Diagnosis not present

## 2018-02-06 DIAGNOSIS — E785 Hyperlipidemia, unspecified: Secondary | ICD-10-CM | POA: Diagnosis not present

## 2018-02-06 DIAGNOSIS — E1122 Type 2 diabetes mellitus with diabetic chronic kidney disease: Secondary | ICD-10-CM | POA: Diagnosis not present

## 2018-02-06 DIAGNOSIS — Z96611 Presence of right artificial shoulder joint: Secondary | ICD-10-CM | POA: Diagnosis not present

## 2018-02-06 DIAGNOSIS — Z853 Personal history of malignant neoplasm of breast: Secondary | ICD-10-CM | POA: Diagnosis not present

## 2018-02-06 DIAGNOSIS — M80021D Age-related osteoporosis with current pathological fracture, right humerus, subsequent encounter for fracture with routine healing: Secondary | ICD-10-CM | POA: Diagnosis not present

## 2018-02-06 DIAGNOSIS — K219 Gastro-esophageal reflux disease without esophagitis: Secondary | ICD-10-CM | POA: Diagnosis not present

## 2018-02-06 DIAGNOSIS — M47816 Spondylosis without myelopathy or radiculopathy, lumbar region: Secondary | ICD-10-CM | POA: Diagnosis not present

## 2018-02-06 DIAGNOSIS — E039 Hypothyroidism, unspecified: Secondary | ICD-10-CM | POA: Diagnosis not present

## 2018-02-09 DIAGNOSIS — E039 Hypothyroidism, unspecified: Secondary | ICD-10-CM | POA: Diagnosis not present

## 2018-02-09 DIAGNOSIS — E1122 Type 2 diabetes mellitus with diabetic chronic kidney disease: Secondary | ICD-10-CM | POA: Diagnosis not present

## 2018-02-09 DIAGNOSIS — E785 Hyperlipidemia, unspecified: Secondary | ICD-10-CM | POA: Diagnosis not present

## 2018-02-09 DIAGNOSIS — Z853 Personal history of malignant neoplasm of breast: Secondary | ICD-10-CM | POA: Diagnosis not present

## 2018-02-09 DIAGNOSIS — K219 Gastro-esophageal reflux disease without esophagitis: Secondary | ICD-10-CM | POA: Diagnosis not present

## 2018-02-09 DIAGNOSIS — N183 Chronic kidney disease, stage 3 (moderate): Secondary | ICD-10-CM | POA: Diagnosis not present

## 2018-02-09 DIAGNOSIS — M80021D Age-related osteoporosis with current pathological fracture, right humerus, subsequent encounter for fracture with routine healing: Secondary | ICD-10-CM | POA: Diagnosis not present

## 2018-02-09 DIAGNOSIS — Z96611 Presence of right artificial shoulder joint: Secondary | ICD-10-CM | POA: Diagnosis not present

## 2018-02-09 DIAGNOSIS — Z96641 Presence of right artificial hip joint: Secondary | ICD-10-CM | POA: Diagnosis not present

## 2018-02-09 DIAGNOSIS — M47816 Spondylosis without myelopathy or radiculopathy, lumbar region: Secondary | ICD-10-CM | POA: Diagnosis not present

## 2018-02-11 DIAGNOSIS — E785 Hyperlipidemia, unspecified: Secondary | ICD-10-CM | POA: Diagnosis not present

## 2018-02-11 DIAGNOSIS — M80021D Age-related osteoporosis with current pathological fracture, right humerus, subsequent encounter for fracture with routine healing: Secondary | ICD-10-CM | POA: Diagnosis not present

## 2018-02-11 DIAGNOSIS — K219 Gastro-esophageal reflux disease without esophagitis: Secondary | ICD-10-CM | POA: Diagnosis not present

## 2018-02-11 DIAGNOSIS — N183 Chronic kidney disease, stage 3 (moderate): Secondary | ICD-10-CM | POA: Diagnosis not present

## 2018-02-11 DIAGNOSIS — E039 Hypothyroidism, unspecified: Secondary | ICD-10-CM | POA: Diagnosis not present

## 2018-02-11 DIAGNOSIS — Z96611 Presence of right artificial shoulder joint: Secondary | ICD-10-CM | POA: Diagnosis not present

## 2018-02-11 DIAGNOSIS — Z96641 Presence of right artificial hip joint: Secondary | ICD-10-CM | POA: Diagnosis not present

## 2018-02-11 DIAGNOSIS — M47816 Spondylosis without myelopathy or radiculopathy, lumbar region: Secondary | ICD-10-CM | POA: Diagnosis not present

## 2018-02-11 DIAGNOSIS — Z853 Personal history of malignant neoplasm of breast: Secondary | ICD-10-CM | POA: Diagnosis not present

## 2018-02-11 DIAGNOSIS — E1122 Type 2 diabetes mellitus with diabetic chronic kidney disease: Secondary | ICD-10-CM | POA: Diagnosis not present

## 2018-02-12 DIAGNOSIS — E039 Hypothyroidism, unspecified: Secondary | ICD-10-CM | POA: Diagnosis not present

## 2018-02-12 DIAGNOSIS — Z96641 Presence of right artificial hip joint: Secondary | ICD-10-CM | POA: Diagnosis not present

## 2018-02-12 DIAGNOSIS — Z853 Personal history of malignant neoplasm of breast: Secondary | ICD-10-CM | POA: Diagnosis not present

## 2018-02-12 DIAGNOSIS — Z96611 Presence of right artificial shoulder joint: Secondary | ICD-10-CM | POA: Diagnosis not present

## 2018-02-12 DIAGNOSIS — E1122 Type 2 diabetes mellitus with diabetic chronic kidney disease: Secondary | ICD-10-CM | POA: Diagnosis not present

## 2018-02-12 DIAGNOSIS — N183 Chronic kidney disease, stage 3 (moderate): Secondary | ICD-10-CM | POA: Diagnosis not present

## 2018-02-12 DIAGNOSIS — K219 Gastro-esophageal reflux disease without esophagitis: Secondary | ICD-10-CM | POA: Diagnosis not present

## 2018-02-12 DIAGNOSIS — E785 Hyperlipidemia, unspecified: Secondary | ICD-10-CM | POA: Diagnosis not present

## 2018-02-12 DIAGNOSIS — M80021D Age-related osteoporosis with current pathological fracture, right humerus, subsequent encounter for fracture with routine healing: Secondary | ICD-10-CM | POA: Diagnosis not present

## 2018-02-12 DIAGNOSIS — M47816 Spondylosis without myelopathy or radiculopathy, lumbar region: Secondary | ICD-10-CM | POA: Diagnosis not present

## 2018-02-13 DIAGNOSIS — M80021D Age-related osteoporosis with current pathological fracture, right humerus, subsequent encounter for fracture with routine healing: Secondary | ICD-10-CM | POA: Diagnosis not present

## 2018-02-13 DIAGNOSIS — Z96641 Presence of right artificial hip joint: Secondary | ICD-10-CM | POA: Diagnosis not present

## 2018-02-13 DIAGNOSIS — Z96611 Presence of right artificial shoulder joint: Secondary | ICD-10-CM | POA: Diagnosis not present

## 2018-02-13 DIAGNOSIS — E1122 Type 2 diabetes mellitus with diabetic chronic kidney disease: Secondary | ICD-10-CM | POA: Diagnosis not present

## 2018-02-13 DIAGNOSIS — Z853 Personal history of malignant neoplasm of breast: Secondary | ICD-10-CM | POA: Diagnosis not present

## 2018-02-13 DIAGNOSIS — E039 Hypothyroidism, unspecified: Secondary | ICD-10-CM | POA: Diagnosis not present

## 2018-02-13 DIAGNOSIS — N183 Chronic kidney disease, stage 3 (moderate): Secondary | ICD-10-CM | POA: Diagnosis not present

## 2018-02-13 DIAGNOSIS — M47816 Spondylosis without myelopathy or radiculopathy, lumbar region: Secondary | ICD-10-CM | POA: Diagnosis not present

## 2018-02-13 DIAGNOSIS — K219 Gastro-esophageal reflux disease without esophagitis: Secondary | ICD-10-CM | POA: Diagnosis not present

## 2018-02-13 DIAGNOSIS — E785 Hyperlipidemia, unspecified: Secondary | ICD-10-CM | POA: Diagnosis not present

## 2018-02-16 ENCOUNTER — Inpatient Hospital Stay: Payer: Medicare Other | Admitting: Family Medicine

## 2018-02-16 DIAGNOSIS — K219 Gastro-esophageal reflux disease without esophagitis: Secondary | ICD-10-CM | POA: Diagnosis not present

## 2018-02-16 DIAGNOSIS — E785 Hyperlipidemia, unspecified: Secondary | ICD-10-CM | POA: Diagnosis not present

## 2018-02-16 DIAGNOSIS — E1122 Type 2 diabetes mellitus with diabetic chronic kidney disease: Secondary | ICD-10-CM | POA: Diagnosis not present

## 2018-02-16 DIAGNOSIS — M80021D Age-related osteoporosis with current pathological fracture, right humerus, subsequent encounter for fracture with routine healing: Secondary | ICD-10-CM | POA: Diagnosis not present

## 2018-02-16 DIAGNOSIS — E039 Hypothyroidism, unspecified: Secondary | ICD-10-CM | POA: Diagnosis not present

## 2018-02-16 DIAGNOSIS — Z96641 Presence of right artificial hip joint: Secondary | ICD-10-CM | POA: Diagnosis not present

## 2018-02-16 DIAGNOSIS — M47816 Spondylosis without myelopathy or radiculopathy, lumbar region: Secondary | ICD-10-CM | POA: Diagnosis not present

## 2018-02-16 DIAGNOSIS — Z853 Personal history of malignant neoplasm of breast: Secondary | ICD-10-CM | POA: Diagnosis not present

## 2018-02-16 DIAGNOSIS — Z96611 Presence of right artificial shoulder joint: Secondary | ICD-10-CM | POA: Diagnosis not present

## 2018-02-16 DIAGNOSIS — N183 Chronic kidney disease, stage 3 (moderate): Secondary | ICD-10-CM | POA: Diagnosis not present

## 2018-02-18 DIAGNOSIS — E1122 Type 2 diabetes mellitus with diabetic chronic kidney disease: Secondary | ICD-10-CM | POA: Diagnosis not present

## 2018-02-18 DIAGNOSIS — Z96641 Presence of right artificial hip joint: Secondary | ICD-10-CM | POA: Diagnosis not present

## 2018-02-18 DIAGNOSIS — K219 Gastro-esophageal reflux disease without esophagitis: Secondary | ICD-10-CM | POA: Diagnosis not present

## 2018-02-18 DIAGNOSIS — Z853 Personal history of malignant neoplasm of breast: Secondary | ICD-10-CM | POA: Diagnosis not present

## 2018-02-18 DIAGNOSIS — E785 Hyperlipidemia, unspecified: Secondary | ICD-10-CM | POA: Diagnosis not present

## 2018-02-18 DIAGNOSIS — M47816 Spondylosis without myelopathy or radiculopathy, lumbar region: Secondary | ICD-10-CM | POA: Diagnosis not present

## 2018-02-18 DIAGNOSIS — N183 Chronic kidney disease, stage 3 (moderate): Secondary | ICD-10-CM | POA: Diagnosis not present

## 2018-02-18 DIAGNOSIS — E039 Hypothyroidism, unspecified: Secondary | ICD-10-CM | POA: Diagnosis not present

## 2018-02-18 DIAGNOSIS — M80021D Age-related osteoporosis with current pathological fracture, right humerus, subsequent encounter for fracture with routine healing: Secondary | ICD-10-CM | POA: Diagnosis not present

## 2018-02-18 DIAGNOSIS — Z96611 Presence of right artificial shoulder joint: Secondary | ICD-10-CM | POA: Diagnosis not present

## 2018-02-20 DIAGNOSIS — M80021D Age-related osteoporosis with current pathological fracture, right humerus, subsequent encounter for fracture with routine healing: Secondary | ICD-10-CM | POA: Diagnosis not present

## 2018-02-20 DIAGNOSIS — Z853 Personal history of malignant neoplasm of breast: Secondary | ICD-10-CM | POA: Diagnosis not present

## 2018-02-20 DIAGNOSIS — E785 Hyperlipidemia, unspecified: Secondary | ICD-10-CM | POA: Diagnosis not present

## 2018-02-20 DIAGNOSIS — Z96641 Presence of right artificial hip joint: Secondary | ICD-10-CM | POA: Diagnosis not present

## 2018-02-20 DIAGNOSIS — E039 Hypothyroidism, unspecified: Secondary | ICD-10-CM | POA: Diagnosis not present

## 2018-02-20 DIAGNOSIS — K219 Gastro-esophageal reflux disease without esophagitis: Secondary | ICD-10-CM | POA: Diagnosis not present

## 2018-02-20 DIAGNOSIS — Z96611 Presence of right artificial shoulder joint: Secondary | ICD-10-CM | POA: Diagnosis not present

## 2018-02-20 DIAGNOSIS — M47816 Spondylosis without myelopathy or radiculopathy, lumbar region: Secondary | ICD-10-CM | POA: Diagnosis not present

## 2018-02-20 DIAGNOSIS — N183 Chronic kidney disease, stage 3 (moderate): Secondary | ICD-10-CM | POA: Diagnosis not present

## 2018-02-20 DIAGNOSIS — E1122 Type 2 diabetes mellitus with diabetic chronic kidney disease: Secondary | ICD-10-CM | POA: Diagnosis not present

## 2018-02-23 DIAGNOSIS — Z853 Personal history of malignant neoplasm of breast: Secondary | ICD-10-CM | POA: Diagnosis not present

## 2018-02-23 DIAGNOSIS — E1122 Type 2 diabetes mellitus with diabetic chronic kidney disease: Secondary | ICD-10-CM | POA: Diagnosis not present

## 2018-02-23 DIAGNOSIS — Z96641 Presence of right artificial hip joint: Secondary | ICD-10-CM | POA: Diagnosis not present

## 2018-02-23 DIAGNOSIS — K219 Gastro-esophageal reflux disease without esophagitis: Secondary | ICD-10-CM | POA: Diagnosis not present

## 2018-02-23 DIAGNOSIS — M80021D Age-related osteoporosis with current pathological fracture, right humerus, subsequent encounter for fracture with routine healing: Secondary | ICD-10-CM | POA: Diagnosis not present

## 2018-02-23 DIAGNOSIS — M47816 Spondylosis without myelopathy or radiculopathy, lumbar region: Secondary | ICD-10-CM | POA: Diagnosis not present

## 2018-02-23 DIAGNOSIS — E785 Hyperlipidemia, unspecified: Secondary | ICD-10-CM | POA: Diagnosis not present

## 2018-02-23 DIAGNOSIS — Z96611 Presence of right artificial shoulder joint: Secondary | ICD-10-CM | POA: Diagnosis not present

## 2018-02-23 DIAGNOSIS — E039 Hypothyroidism, unspecified: Secondary | ICD-10-CM | POA: Diagnosis not present

## 2018-02-23 DIAGNOSIS — N183 Chronic kidney disease, stage 3 (moderate): Secondary | ICD-10-CM | POA: Diagnosis not present

## 2018-02-24 DIAGNOSIS — Z96641 Presence of right artificial hip joint: Secondary | ICD-10-CM | POA: Diagnosis not present

## 2018-02-24 DIAGNOSIS — E039 Hypothyroidism, unspecified: Secondary | ICD-10-CM | POA: Diagnosis not present

## 2018-02-24 DIAGNOSIS — Z96611 Presence of right artificial shoulder joint: Secondary | ICD-10-CM | POA: Diagnosis not present

## 2018-02-24 DIAGNOSIS — E1122 Type 2 diabetes mellitus with diabetic chronic kidney disease: Secondary | ICD-10-CM | POA: Diagnosis not present

## 2018-02-24 DIAGNOSIS — N183 Chronic kidney disease, stage 3 (moderate): Secondary | ICD-10-CM | POA: Diagnosis not present

## 2018-02-24 DIAGNOSIS — M47816 Spondylosis without myelopathy or radiculopathy, lumbar region: Secondary | ICD-10-CM | POA: Diagnosis not present

## 2018-02-24 DIAGNOSIS — K219 Gastro-esophageal reflux disease without esophagitis: Secondary | ICD-10-CM | POA: Diagnosis not present

## 2018-02-24 DIAGNOSIS — E785 Hyperlipidemia, unspecified: Secondary | ICD-10-CM | POA: Diagnosis not present

## 2018-02-24 DIAGNOSIS — Z853 Personal history of malignant neoplasm of breast: Secondary | ICD-10-CM | POA: Diagnosis not present

## 2018-02-24 DIAGNOSIS — M80021D Age-related osteoporosis with current pathological fracture, right humerus, subsequent encounter for fracture with routine healing: Secondary | ICD-10-CM | POA: Diagnosis not present

## 2018-02-25 DIAGNOSIS — Z96611 Presence of right artificial shoulder joint: Secondary | ICD-10-CM | POA: Diagnosis not present

## 2018-02-25 DIAGNOSIS — Z853 Personal history of malignant neoplasm of breast: Secondary | ICD-10-CM | POA: Diagnosis not present

## 2018-02-25 DIAGNOSIS — M80021D Age-related osteoporosis with current pathological fracture, right humerus, subsequent encounter for fracture with routine healing: Secondary | ICD-10-CM | POA: Diagnosis not present

## 2018-02-25 DIAGNOSIS — K219 Gastro-esophageal reflux disease without esophagitis: Secondary | ICD-10-CM | POA: Diagnosis not present

## 2018-02-25 DIAGNOSIS — E039 Hypothyroidism, unspecified: Secondary | ICD-10-CM | POA: Diagnosis not present

## 2018-02-25 DIAGNOSIS — N183 Chronic kidney disease, stage 3 (moderate): Secondary | ICD-10-CM | POA: Diagnosis not present

## 2018-02-25 DIAGNOSIS — M47816 Spondylosis without myelopathy or radiculopathy, lumbar region: Secondary | ICD-10-CM | POA: Diagnosis not present

## 2018-02-25 DIAGNOSIS — E785 Hyperlipidemia, unspecified: Secondary | ICD-10-CM | POA: Diagnosis not present

## 2018-02-25 DIAGNOSIS — E1122 Type 2 diabetes mellitus with diabetic chronic kidney disease: Secondary | ICD-10-CM | POA: Diagnosis not present

## 2018-02-25 DIAGNOSIS — Z96641 Presence of right artificial hip joint: Secondary | ICD-10-CM | POA: Diagnosis not present

## 2018-02-26 DIAGNOSIS — N183 Chronic kidney disease, stage 3 (moderate): Secondary | ICD-10-CM | POA: Diagnosis not present

## 2018-02-26 DIAGNOSIS — M47816 Spondylosis without myelopathy or radiculopathy, lumbar region: Secondary | ICD-10-CM | POA: Diagnosis not present

## 2018-02-26 DIAGNOSIS — E1122 Type 2 diabetes mellitus with diabetic chronic kidney disease: Secondary | ICD-10-CM | POA: Diagnosis not present

## 2018-02-26 DIAGNOSIS — Z96641 Presence of right artificial hip joint: Secondary | ICD-10-CM | POA: Diagnosis not present

## 2018-02-26 DIAGNOSIS — E039 Hypothyroidism, unspecified: Secondary | ICD-10-CM | POA: Diagnosis not present

## 2018-02-26 DIAGNOSIS — M80021D Age-related osteoporosis with current pathological fracture, right humerus, subsequent encounter for fracture with routine healing: Secondary | ICD-10-CM | POA: Diagnosis not present

## 2018-02-26 DIAGNOSIS — Z96611 Presence of right artificial shoulder joint: Secondary | ICD-10-CM | POA: Diagnosis not present

## 2018-02-26 DIAGNOSIS — Z853 Personal history of malignant neoplasm of breast: Secondary | ICD-10-CM | POA: Diagnosis not present

## 2018-02-26 DIAGNOSIS — K219 Gastro-esophageal reflux disease without esophagitis: Secondary | ICD-10-CM | POA: Diagnosis not present

## 2018-02-26 DIAGNOSIS — E785 Hyperlipidemia, unspecified: Secondary | ICD-10-CM | POA: Diagnosis not present

## 2018-02-27 DIAGNOSIS — Z96611 Presence of right artificial shoulder joint: Secondary | ICD-10-CM | POA: Diagnosis not present

## 2018-02-27 DIAGNOSIS — N183 Chronic kidney disease, stage 3 (moderate): Secondary | ICD-10-CM | POA: Diagnosis not present

## 2018-02-27 DIAGNOSIS — Z96641 Presence of right artificial hip joint: Secondary | ICD-10-CM | POA: Diagnosis not present

## 2018-02-27 DIAGNOSIS — E785 Hyperlipidemia, unspecified: Secondary | ICD-10-CM | POA: Diagnosis not present

## 2018-02-27 DIAGNOSIS — E039 Hypothyroidism, unspecified: Secondary | ICD-10-CM | POA: Diagnosis not present

## 2018-02-27 DIAGNOSIS — M80021D Age-related osteoporosis with current pathological fracture, right humerus, subsequent encounter for fracture with routine healing: Secondary | ICD-10-CM | POA: Diagnosis not present

## 2018-02-27 DIAGNOSIS — K219 Gastro-esophageal reflux disease without esophagitis: Secondary | ICD-10-CM | POA: Diagnosis not present

## 2018-02-27 DIAGNOSIS — Z853 Personal history of malignant neoplasm of breast: Secondary | ICD-10-CM | POA: Diagnosis not present

## 2018-02-27 DIAGNOSIS — E1122 Type 2 diabetes mellitus with diabetic chronic kidney disease: Secondary | ICD-10-CM | POA: Diagnosis not present

## 2018-02-27 DIAGNOSIS — M47816 Spondylosis without myelopathy or radiculopathy, lumbar region: Secondary | ICD-10-CM | POA: Diagnosis not present

## 2018-03-02 DIAGNOSIS — N183 Chronic kidney disease, stage 3 (moderate): Secondary | ICD-10-CM | POA: Diagnosis not present

## 2018-03-02 DIAGNOSIS — E1122 Type 2 diabetes mellitus with diabetic chronic kidney disease: Secondary | ICD-10-CM | POA: Diagnosis not present

## 2018-03-02 DIAGNOSIS — M80021D Age-related osteoporosis with current pathological fracture, right humerus, subsequent encounter for fracture with routine healing: Secondary | ICD-10-CM | POA: Diagnosis not present

## 2018-03-02 DIAGNOSIS — Z96611 Presence of right artificial shoulder joint: Secondary | ICD-10-CM | POA: Diagnosis not present

## 2018-03-02 DIAGNOSIS — K219 Gastro-esophageal reflux disease without esophagitis: Secondary | ICD-10-CM | POA: Diagnosis not present

## 2018-03-02 DIAGNOSIS — Z96641 Presence of right artificial hip joint: Secondary | ICD-10-CM | POA: Diagnosis not present

## 2018-03-02 DIAGNOSIS — E039 Hypothyroidism, unspecified: Secondary | ICD-10-CM | POA: Diagnosis not present

## 2018-03-02 DIAGNOSIS — Z853 Personal history of malignant neoplasm of breast: Secondary | ICD-10-CM | POA: Diagnosis not present

## 2018-03-02 DIAGNOSIS — E785 Hyperlipidemia, unspecified: Secondary | ICD-10-CM | POA: Diagnosis not present

## 2018-03-02 DIAGNOSIS — M47816 Spondylosis without myelopathy or radiculopathy, lumbar region: Secondary | ICD-10-CM | POA: Diagnosis not present

## 2018-03-04 ENCOUNTER — Other Ambulatory Visit: Payer: Self-pay | Admitting: Family Medicine

## 2018-03-04 DIAGNOSIS — K219 Gastro-esophageal reflux disease without esophagitis: Secondary | ICD-10-CM | POA: Diagnosis not present

## 2018-03-04 DIAGNOSIS — E039 Hypothyroidism, unspecified: Secondary | ICD-10-CM | POA: Diagnosis not present

## 2018-03-04 DIAGNOSIS — M47816 Spondylosis without myelopathy or radiculopathy, lumbar region: Secondary | ICD-10-CM | POA: Diagnosis not present

## 2018-03-04 DIAGNOSIS — E785 Hyperlipidemia, unspecified: Secondary | ICD-10-CM | POA: Diagnosis not present

## 2018-03-04 DIAGNOSIS — Z96611 Presence of right artificial shoulder joint: Secondary | ICD-10-CM | POA: Diagnosis not present

## 2018-03-04 DIAGNOSIS — Z853 Personal history of malignant neoplasm of breast: Secondary | ICD-10-CM | POA: Diagnosis not present

## 2018-03-04 DIAGNOSIS — N183 Chronic kidney disease, stage 3 (moderate): Secondary | ICD-10-CM | POA: Diagnosis not present

## 2018-03-04 DIAGNOSIS — Z96641 Presence of right artificial hip joint: Secondary | ICD-10-CM | POA: Diagnosis not present

## 2018-03-04 DIAGNOSIS — M80021D Age-related osteoporosis with current pathological fracture, right humerus, subsequent encounter for fracture with routine healing: Secondary | ICD-10-CM | POA: Diagnosis not present

## 2018-03-04 DIAGNOSIS — E1122 Type 2 diabetes mellitus with diabetic chronic kidney disease: Secondary | ICD-10-CM | POA: Diagnosis not present

## 2018-03-06 DIAGNOSIS — N183 Chronic kidney disease, stage 3 (moderate): Secondary | ICD-10-CM | POA: Diagnosis not present

## 2018-03-06 DIAGNOSIS — E039 Hypothyroidism, unspecified: Secondary | ICD-10-CM | POA: Diagnosis not present

## 2018-03-06 DIAGNOSIS — K219 Gastro-esophageal reflux disease without esophagitis: Secondary | ICD-10-CM | POA: Diagnosis not present

## 2018-03-06 DIAGNOSIS — M80021D Age-related osteoporosis with current pathological fracture, right humerus, subsequent encounter for fracture with routine healing: Secondary | ICD-10-CM | POA: Diagnosis not present

## 2018-03-06 DIAGNOSIS — E785 Hyperlipidemia, unspecified: Secondary | ICD-10-CM | POA: Diagnosis not present

## 2018-03-06 DIAGNOSIS — M47816 Spondylosis without myelopathy or radiculopathy, lumbar region: Secondary | ICD-10-CM | POA: Diagnosis not present

## 2018-03-06 DIAGNOSIS — Z853 Personal history of malignant neoplasm of breast: Secondary | ICD-10-CM | POA: Diagnosis not present

## 2018-03-06 DIAGNOSIS — Z96641 Presence of right artificial hip joint: Secondary | ICD-10-CM | POA: Diagnosis not present

## 2018-03-06 DIAGNOSIS — Z96611 Presence of right artificial shoulder joint: Secondary | ICD-10-CM | POA: Diagnosis not present

## 2018-03-06 DIAGNOSIS — E1122 Type 2 diabetes mellitus with diabetic chronic kidney disease: Secondary | ICD-10-CM | POA: Diagnosis not present

## 2018-03-07 DIAGNOSIS — S42301D Unspecified fracture of shaft of humerus, right arm, subsequent encounter for fracture with routine healing: Secondary | ICD-10-CM | POA: Diagnosis not present

## 2018-03-07 DIAGNOSIS — E1122 Type 2 diabetes mellitus with diabetic chronic kidney disease: Secondary | ICD-10-CM | POA: Diagnosis not present

## 2018-03-07 DIAGNOSIS — W19XXXD Unspecified fall, subsequent encounter: Secondary | ICD-10-CM | POA: Diagnosis not present

## 2018-03-07 DIAGNOSIS — I129 Hypertensive chronic kidney disease with stage 1 through stage 4 chronic kidney disease, or unspecified chronic kidney disease: Secondary | ICD-10-CM | POA: Diagnosis not present

## 2018-03-09 DIAGNOSIS — Z471 Aftercare following joint replacement surgery: Secondary | ICD-10-CM | POA: Diagnosis not present

## 2018-03-09 DIAGNOSIS — Z96611 Presence of right artificial shoulder joint: Secondary | ICD-10-CM | POA: Diagnosis not present

## 2018-03-10 DIAGNOSIS — Z853 Personal history of malignant neoplasm of breast: Secondary | ICD-10-CM | POA: Diagnosis not present

## 2018-03-10 DIAGNOSIS — E039 Hypothyroidism, unspecified: Secondary | ICD-10-CM | POA: Diagnosis not present

## 2018-03-10 DIAGNOSIS — E1122 Type 2 diabetes mellitus with diabetic chronic kidney disease: Secondary | ICD-10-CM | POA: Diagnosis not present

## 2018-03-10 DIAGNOSIS — E785 Hyperlipidemia, unspecified: Secondary | ICD-10-CM | POA: Diagnosis not present

## 2018-03-10 DIAGNOSIS — K219 Gastro-esophageal reflux disease without esophagitis: Secondary | ICD-10-CM | POA: Diagnosis not present

## 2018-03-10 DIAGNOSIS — N183 Chronic kidney disease, stage 3 (moderate): Secondary | ICD-10-CM | POA: Diagnosis not present

## 2018-03-10 DIAGNOSIS — Z96641 Presence of right artificial hip joint: Secondary | ICD-10-CM | POA: Diagnosis not present

## 2018-03-10 DIAGNOSIS — M80021D Age-related osteoporosis with current pathological fracture, right humerus, subsequent encounter for fracture with routine healing: Secondary | ICD-10-CM | POA: Diagnosis not present

## 2018-03-10 DIAGNOSIS — Z96611 Presence of right artificial shoulder joint: Secondary | ICD-10-CM | POA: Diagnosis not present

## 2018-03-10 DIAGNOSIS — M47816 Spondylosis without myelopathy or radiculopathy, lumbar region: Secondary | ICD-10-CM | POA: Diagnosis not present

## 2018-03-11 DIAGNOSIS — E1122 Type 2 diabetes mellitus with diabetic chronic kidney disease: Secondary | ICD-10-CM | POA: Diagnosis not present

## 2018-03-11 DIAGNOSIS — E039 Hypothyroidism, unspecified: Secondary | ICD-10-CM | POA: Diagnosis not present

## 2018-03-11 DIAGNOSIS — Z96641 Presence of right artificial hip joint: Secondary | ICD-10-CM | POA: Diagnosis not present

## 2018-03-11 DIAGNOSIS — Z96611 Presence of right artificial shoulder joint: Secondary | ICD-10-CM | POA: Diagnosis not present

## 2018-03-11 DIAGNOSIS — E785 Hyperlipidemia, unspecified: Secondary | ICD-10-CM | POA: Diagnosis not present

## 2018-03-11 DIAGNOSIS — N183 Chronic kidney disease, stage 3 (moderate): Secondary | ICD-10-CM | POA: Diagnosis not present

## 2018-03-11 DIAGNOSIS — Z853 Personal history of malignant neoplasm of breast: Secondary | ICD-10-CM | POA: Diagnosis not present

## 2018-03-11 DIAGNOSIS — M47816 Spondylosis without myelopathy or radiculopathy, lumbar region: Secondary | ICD-10-CM | POA: Diagnosis not present

## 2018-03-11 DIAGNOSIS — M80021D Age-related osteoporosis with current pathological fracture, right humerus, subsequent encounter for fracture with routine healing: Secondary | ICD-10-CM | POA: Diagnosis not present

## 2018-03-11 DIAGNOSIS — K219 Gastro-esophageal reflux disease without esophagitis: Secondary | ICD-10-CM | POA: Diagnosis not present

## 2018-03-12 DIAGNOSIS — Z96611 Presence of right artificial shoulder joint: Secondary | ICD-10-CM | POA: Diagnosis not present

## 2018-03-12 DIAGNOSIS — Z96641 Presence of right artificial hip joint: Secondary | ICD-10-CM | POA: Diagnosis not present

## 2018-03-12 DIAGNOSIS — N183 Chronic kidney disease, stage 3 (moderate): Secondary | ICD-10-CM | POA: Diagnosis not present

## 2018-03-12 DIAGNOSIS — M80021D Age-related osteoporosis with current pathological fracture, right humerus, subsequent encounter for fracture with routine healing: Secondary | ICD-10-CM | POA: Diagnosis not present

## 2018-03-12 DIAGNOSIS — E785 Hyperlipidemia, unspecified: Secondary | ICD-10-CM | POA: Diagnosis not present

## 2018-03-12 DIAGNOSIS — E039 Hypothyroidism, unspecified: Secondary | ICD-10-CM | POA: Diagnosis not present

## 2018-03-12 DIAGNOSIS — Z853 Personal history of malignant neoplasm of breast: Secondary | ICD-10-CM | POA: Diagnosis not present

## 2018-03-12 DIAGNOSIS — K219 Gastro-esophageal reflux disease without esophagitis: Secondary | ICD-10-CM | POA: Diagnosis not present

## 2018-03-12 DIAGNOSIS — E1122 Type 2 diabetes mellitus with diabetic chronic kidney disease: Secondary | ICD-10-CM | POA: Diagnosis not present

## 2018-03-12 DIAGNOSIS — M47816 Spondylosis without myelopathy or radiculopathy, lumbar region: Secondary | ICD-10-CM | POA: Diagnosis not present

## 2018-03-13 DIAGNOSIS — Z96611 Presence of right artificial shoulder joint: Secondary | ICD-10-CM | POA: Diagnosis not present

## 2018-03-13 DIAGNOSIS — M47816 Spondylosis without myelopathy or radiculopathy, lumbar region: Secondary | ICD-10-CM | POA: Diagnosis not present

## 2018-03-13 DIAGNOSIS — E1122 Type 2 diabetes mellitus with diabetic chronic kidney disease: Secondary | ICD-10-CM | POA: Diagnosis not present

## 2018-03-13 DIAGNOSIS — Z96641 Presence of right artificial hip joint: Secondary | ICD-10-CM | POA: Diagnosis not present

## 2018-03-13 DIAGNOSIS — Z853 Personal history of malignant neoplasm of breast: Secondary | ICD-10-CM | POA: Diagnosis not present

## 2018-03-13 DIAGNOSIS — N183 Chronic kidney disease, stage 3 (moderate): Secondary | ICD-10-CM | POA: Diagnosis not present

## 2018-03-13 DIAGNOSIS — E785 Hyperlipidemia, unspecified: Secondary | ICD-10-CM | POA: Diagnosis not present

## 2018-03-13 DIAGNOSIS — M80021D Age-related osteoporosis with current pathological fracture, right humerus, subsequent encounter for fracture with routine healing: Secondary | ICD-10-CM | POA: Diagnosis not present

## 2018-03-13 DIAGNOSIS — E039 Hypothyroidism, unspecified: Secondary | ICD-10-CM | POA: Diagnosis not present

## 2018-03-13 DIAGNOSIS — K219 Gastro-esophageal reflux disease without esophagitis: Secondary | ICD-10-CM | POA: Diagnosis not present

## 2018-03-18 DIAGNOSIS — Z96611 Presence of right artificial shoulder joint: Secondary | ICD-10-CM | POA: Diagnosis not present

## 2018-03-18 DIAGNOSIS — M25511 Pain in right shoulder: Secondary | ICD-10-CM | POA: Diagnosis not present

## 2018-03-23 DIAGNOSIS — M25511 Pain in right shoulder: Secondary | ICD-10-CM | POA: Diagnosis not present

## 2018-03-23 DIAGNOSIS — Z96611 Presence of right artificial shoulder joint: Secondary | ICD-10-CM | POA: Diagnosis not present

## 2018-03-25 DIAGNOSIS — Z96611 Presence of right artificial shoulder joint: Secondary | ICD-10-CM | POA: Diagnosis not present

## 2018-03-25 DIAGNOSIS — M25511 Pain in right shoulder: Secondary | ICD-10-CM | POA: Diagnosis not present

## 2018-03-30 ENCOUNTER — Other Ambulatory Visit: Payer: Self-pay | Admitting: Family Medicine

## 2018-04-05 DIAGNOSIS — E1122 Type 2 diabetes mellitus with diabetic chronic kidney disease: Secondary | ICD-10-CM | POA: Diagnosis not present

## 2018-04-05 DIAGNOSIS — W19XXXD Unspecified fall, subsequent encounter: Secondary | ICD-10-CM | POA: Diagnosis not present

## 2018-04-05 DIAGNOSIS — S42301D Unspecified fracture of shaft of humerus, right arm, subsequent encounter for fracture with routine healing: Secondary | ICD-10-CM | POA: Diagnosis not present

## 2018-04-05 DIAGNOSIS — I129 Hypertensive chronic kidney disease with stage 1 through stage 4 chronic kidney disease, or unspecified chronic kidney disease: Secondary | ICD-10-CM | POA: Diagnosis not present

## 2018-04-29 ENCOUNTER — Encounter: Payer: Medicare Other | Admitting: Family Medicine

## 2018-05-06 DIAGNOSIS — E1122 Type 2 diabetes mellitus with diabetic chronic kidney disease: Secondary | ICD-10-CM | POA: Diagnosis not present

## 2018-05-06 DIAGNOSIS — I129 Hypertensive chronic kidney disease with stage 1 through stage 4 chronic kidney disease, or unspecified chronic kidney disease: Secondary | ICD-10-CM | POA: Diagnosis not present

## 2018-05-06 DIAGNOSIS — S42301D Unspecified fracture of shaft of humerus, right arm, subsequent encounter for fracture with routine healing: Secondary | ICD-10-CM | POA: Diagnosis not present

## 2018-05-06 DIAGNOSIS — W19XXXD Unspecified fall, subsequent encounter: Secondary | ICD-10-CM | POA: Diagnosis not present

## 2018-05-07 ENCOUNTER — Other Ambulatory Visit: Payer: Self-pay | Admitting: Hematology and Oncology

## 2018-05-07 DIAGNOSIS — Z853 Personal history of malignant neoplasm of breast: Secondary | ICD-10-CM

## 2018-06-03 ENCOUNTER — Other Ambulatory Visit: Payer: Self-pay | Admitting: Family Medicine

## 2018-06-03 ENCOUNTER — Telehealth: Payer: Self-pay | Admitting: Family Medicine

## 2018-06-03 NOTE — Telephone Encounter (Signed)
Copied from [place]. Topic: Quick Communication - See Telephone Encounter >> Jun 03, 2018  4:00 PM Vernona Rieger wrote: CRM for notification. See Telephone encounter for: 06/03/18. Jessi with [facility] called to see if Dr Anitra Lauth meant to only write levothyroxine (SYNTHROID) 50 MCG tablet for 60 tablets. She said she normally gets 90 tablets. Please Advise.

## 2018-06-04 NOTE — Telephone Encounter (Signed)
Pt was called and given information. She verbalized understanding.

## 2018-06-05 DIAGNOSIS — I129 Hypertensive chronic kidney disease with stage 1 through stage 4 chronic kidney disease, or unspecified chronic kidney disease: Secondary | ICD-10-CM | POA: Diagnosis not present

## 2018-06-05 DIAGNOSIS — E1122 Type 2 diabetes mellitus with diabetic chronic kidney disease: Secondary | ICD-10-CM | POA: Diagnosis not present

## 2018-06-05 DIAGNOSIS — S42301D Unspecified fracture of shaft of humerus, right arm, subsequent encounter for fracture with routine healing: Secondary | ICD-10-CM | POA: Diagnosis not present

## 2018-06-05 DIAGNOSIS — W19XXXD Unspecified fall, subsequent encounter: Secondary | ICD-10-CM | POA: Diagnosis not present

## 2018-06-10 DIAGNOSIS — M25511 Pain in right shoulder: Secondary | ICD-10-CM | POA: Diagnosis not present

## 2018-06-10 DIAGNOSIS — Z96611 Presence of right artificial shoulder joint: Secondary | ICD-10-CM | POA: Diagnosis not present

## 2018-07-01 DIAGNOSIS — M25511 Pain in right shoulder: Secondary | ICD-10-CM | POA: Diagnosis not present

## 2018-07-01 DIAGNOSIS — Z96611 Presence of right artificial shoulder joint: Secondary | ICD-10-CM | POA: Diagnosis not present

## 2018-07-06 DIAGNOSIS — E1122 Type 2 diabetes mellitus with diabetic chronic kidney disease: Secondary | ICD-10-CM | POA: Diagnosis not present

## 2018-07-06 DIAGNOSIS — I129 Hypertensive chronic kidney disease with stage 1 through stage 4 chronic kidney disease, or unspecified chronic kidney disease: Secondary | ICD-10-CM | POA: Diagnosis not present

## 2018-07-06 DIAGNOSIS — W19XXXD Unspecified fall, subsequent encounter: Secondary | ICD-10-CM | POA: Diagnosis not present

## 2018-07-06 DIAGNOSIS — S42301D Unspecified fracture of shaft of humerus, right arm, subsequent encounter for fracture with routine healing: Secondary | ICD-10-CM | POA: Diagnosis not present

## 2018-07-23 ENCOUNTER — Other Ambulatory Visit: Payer: Self-pay | Admitting: Family Medicine

## 2018-07-29 ENCOUNTER — Encounter: Payer: Medicare Other | Admitting: Family Medicine

## 2018-07-30 ENCOUNTER — Other Ambulatory Visit: Payer: Self-pay | Admitting: Family Medicine

## 2018-08-05 DIAGNOSIS — S42301D Unspecified fracture of shaft of humerus, right arm, subsequent encounter for fracture with routine healing: Secondary | ICD-10-CM | POA: Diagnosis not present

## 2018-08-05 DIAGNOSIS — W19XXXD Unspecified fall, subsequent encounter: Secondary | ICD-10-CM | POA: Diagnosis not present

## 2018-08-05 DIAGNOSIS — E1122 Type 2 diabetes mellitus with diabetic chronic kidney disease: Secondary | ICD-10-CM | POA: Diagnosis not present

## 2018-08-05 DIAGNOSIS — I129 Hypertensive chronic kidney disease with stage 1 through stage 4 chronic kidney disease, or unspecified chronic kidney disease: Secondary | ICD-10-CM | POA: Diagnosis not present

## 2018-08-27 ENCOUNTER — Ambulatory Visit
Admission: RE | Admit: 2018-08-27 | Discharge: 2018-08-27 | Disposition: A | Discharge status: Home / Self Care | Payer: Medicare Other | Source: Ambulatory Visit | Attending: Hematology and Oncology | Admitting: Hematology and Oncology

## 2018-08-27 ENCOUNTER — Other Ambulatory Visit: Payer: Self-pay

## 2018-08-27 DIAGNOSIS — R928 Other abnormal and inconclusive findings on diagnostic imaging of breast: Secondary | ICD-10-CM | POA: Diagnosis not present

## 2018-08-27 DIAGNOSIS — Z853 Personal history of malignant neoplasm of breast: Secondary | ICD-10-CM

## 2018-08-31 ENCOUNTER — Telehealth: Payer: Self-pay | Admitting: Family Medicine

## 2018-08-31 NOTE — Telephone Encounter (Signed)
Please schedule

## 2018-08-31 NOTE — Telephone Encounter (Signed)
Needs o/v: either telemedicine or in-office is fine.-thx

## 2018-08-31 NOTE — Telephone Encounter (Signed)
Please advise, thanks.

## 2018-08-31 NOTE — Telephone Encounter (Signed)
Pt called and said that on Thursday or Friday she cant remember which one but that she had taken magnesium pill and that she felt like it had been stuck in her chest all day and then her stomach as well and that it felt like it was choking her some, she said she eventually puked it up and that the pill had swollen up to 1 and a half inch long and half an inch wide. She said she had also choked on some food the Sunday before and that she has a few times where this has been an issue of things getting caught, She said the pill was the worst so far and she wanted to know what she should.

## 2018-09-01 NOTE — Telephone Encounter (Signed)
Scheduled patient first available 09/17/18

## 2018-09-05 DIAGNOSIS — S42301D Unspecified fracture of shaft of humerus, right arm, subsequent encounter for fracture with routine healing: Secondary | ICD-10-CM | POA: Diagnosis not present

## 2018-09-05 DIAGNOSIS — E1122 Type 2 diabetes mellitus with diabetic chronic kidney disease: Secondary | ICD-10-CM | POA: Diagnosis not present

## 2018-09-05 DIAGNOSIS — I129 Hypertensive chronic kidney disease with stage 1 through stage 4 chronic kidney disease, or unspecified chronic kidney disease: Secondary | ICD-10-CM | POA: Diagnosis not present

## 2018-09-05 DIAGNOSIS — W19XXXD Unspecified fall, subsequent encounter: Secondary | ICD-10-CM | POA: Diagnosis not present

## 2018-09-08 DIAGNOSIS — I4510 Unspecified right bundle-branch block: Secondary | ICD-10-CM | POA: Diagnosis not present

## 2018-09-08 DIAGNOSIS — M1990 Unspecified osteoarthritis, unspecified site: Secondary | ICD-10-CM | POA: Diagnosis not present

## 2018-09-08 DIAGNOSIS — K219 Gastro-esophageal reflux disease without esophagitis: Secondary | ICD-10-CM | POA: Diagnosis not present

## 2018-09-08 DIAGNOSIS — I10 Essential (primary) hypertension: Secondary | ICD-10-CM | POA: Diagnosis not present

## 2018-09-08 DIAGNOSIS — Z853 Personal history of malignant neoplasm of breast: Secondary | ICD-10-CM | POA: Diagnosis not present

## 2018-09-08 DIAGNOSIS — E785 Hyperlipidemia, unspecified: Secondary | ICD-10-CM | POA: Diagnosis not present

## 2018-09-08 DIAGNOSIS — R112 Nausea with vomiting, unspecified: Secondary | ICD-10-CM | POA: Diagnosis not present

## 2018-09-08 DIAGNOSIS — K3189 Other diseases of stomach and duodenum: Secondary | ICD-10-CM | POA: Diagnosis not present

## 2018-09-08 DIAGNOSIS — N281 Cyst of kidney, acquired: Secondary | ICD-10-CM | POA: Diagnosis not present

## 2018-09-08 DIAGNOSIS — Z79899 Other long term (current) drug therapy: Secondary | ICD-10-CM | POA: Diagnosis not present

## 2018-09-08 DIAGNOSIS — K5730 Diverticulosis of large intestine without perforation or abscess without bleeding: Secondary | ICD-10-CM | POA: Diagnosis not present

## 2018-09-08 DIAGNOSIS — K449 Diaphragmatic hernia without obstruction or gangrene: Secondary | ICD-10-CM | POA: Diagnosis not present

## 2018-09-08 DIAGNOSIS — R1310 Dysphagia, unspecified: Secondary | ICD-10-CM | POA: Diagnosis not present

## 2018-09-08 DIAGNOSIS — E039 Hypothyroidism, unspecified: Secondary | ICD-10-CM | POA: Diagnosis not present

## 2018-09-08 DIAGNOSIS — Z888 Allergy status to other drugs, medicaments and biological substances status: Secondary | ICD-10-CM | POA: Diagnosis not present

## 2018-09-09 ENCOUNTER — Telehealth: Payer: Self-pay | Admitting: Family Medicine

## 2018-09-09 DIAGNOSIS — E039 Hypothyroidism, unspecified: Secondary | ICD-10-CM | POA: Diagnosis not present

## 2018-09-09 DIAGNOSIS — K449 Diaphragmatic hernia without obstruction or gangrene: Secondary | ICD-10-CM | POA: Diagnosis not present

## 2018-09-09 DIAGNOSIS — K562 Volvulus: Secondary | ICD-10-CM | POA: Diagnosis not present

## 2018-09-09 DIAGNOSIS — K440 Diaphragmatic hernia with obstruction, without gangrene: Secondary | ICD-10-CM | POA: Diagnosis not present

## 2018-09-09 DIAGNOSIS — K219 Gastro-esophageal reflux disease without esophagitis: Secondary | ICD-10-CM | POA: Diagnosis not present

## 2018-09-09 DIAGNOSIS — R0989 Other specified symptoms and signs involving the circulatory and respiratory systems: Secondary | ICD-10-CM | POA: Diagnosis not present

## 2018-09-09 DIAGNOSIS — R1310 Dysphagia, unspecified: Secondary | ICD-10-CM | POA: Diagnosis not present

## 2018-09-09 DIAGNOSIS — Z853 Personal history of malignant neoplasm of breast: Secondary | ICD-10-CM | POA: Diagnosis not present

## 2018-09-09 DIAGNOSIS — I10 Essential (primary) hypertension: Secondary | ICD-10-CM | POA: Diagnosis not present

## 2018-09-09 DIAGNOSIS — R112 Nausea with vomiting, unspecified: Secondary | ICD-10-CM | POA: Diagnosis not present

## 2018-09-09 DIAGNOSIS — M1990 Unspecified osteoarthritis, unspecified site: Secondary | ICD-10-CM | POA: Diagnosis not present

## 2018-09-09 DIAGNOSIS — Z79899 Other long term (current) drug therapy: Secondary | ICD-10-CM | POA: Diagnosis not present

## 2018-09-09 DIAGNOSIS — E785 Hyperlipidemia, unspecified: Secondary | ICD-10-CM | POA: Diagnosis not present

## 2018-09-09 DIAGNOSIS — K3189 Other diseases of stomach and duodenum: Secondary | ICD-10-CM | POA: Diagnosis not present

## 2018-09-09 DIAGNOSIS — Z888 Allergy status to other drugs, medicaments and biological substances status: Secondary | ICD-10-CM | POA: Diagnosis not present

## 2018-09-09 NOTE — Telephone Encounter (Signed)
Patient left VM 09/08/18 after 4:30PM. Patient states in message that she is unable to swallow water, had large vomiting episode at 3:00AM. Patient states she has an appointment with a specialist 09/09/18 at noon to address issue.  I called patient back this morning. Left message on her answering machine asking patient to call us back.

## 2018-09-09 NOTE — Telephone Encounter (Signed)
Called patient and lvm to cb. Wanted to see how she was feeling and if she still has appointment at noon

## 2018-09-10 DIAGNOSIS — K562 Volvulus: Secondary | ICD-10-CM | POA: Diagnosis not present

## 2018-09-10 DIAGNOSIS — K449 Diaphragmatic hernia without obstruction or gangrene: Secondary | ICD-10-CM | POA: Diagnosis not present

## 2018-09-10 DIAGNOSIS — E785 Hyperlipidemia, unspecified: Secondary | ICD-10-CM | POA: Diagnosis not present

## 2018-09-10 DIAGNOSIS — K440 Diaphragmatic hernia with obstruction, without gangrene: Secondary | ICD-10-CM | POA: Diagnosis not present

## 2018-09-10 DIAGNOSIS — R112 Nausea with vomiting, unspecified: Secondary | ICD-10-CM | POA: Diagnosis not present

## 2018-09-10 DIAGNOSIS — I10 Essential (primary) hypertension: Secondary | ICD-10-CM | POA: Diagnosis not present

## 2018-09-10 DIAGNOSIS — K3189 Other diseases of stomach and duodenum: Secondary | ICD-10-CM | POA: Diagnosis not present

## 2018-09-10 DIAGNOSIS — K219 Gastro-esophageal reflux disease without esophagitis: Secondary | ICD-10-CM | POA: Diagnosis not present

## 2018-09-10 DIAGNOSIS — E039 Hypothyroidism, unspecified: Secondary | ICD-10-CM | POA: Diagnosis not present

## 2018-09-11 DIAGNOSIS — E039 Hypothyroidism, unspecified: Secondary | ICD-10-CM | POA: Diagnosis not present

## 2018-09-11 DIAGNOSIS — E785 Hyperlipidemia, unspecified: Secondary | ICD-10-CM | POA: Diagnosis not present

## 2018-09-11 DIAGNOSIS — K3189 Other diseases of stomach and duodenum: Secondary | ICD-10-CM | POA: Diagnosis not present

## 2018-09-11 DIAGNOSIS — I10 Essential (primary) hypertension: Secondary | ICD-10-CM | POA: Diagnosis not present

## 2018-09-11 DIAGNOSIS — R112 Nausea with vomiting, unspecified: Secondary | ICD-10-CM | POA: Diagnosis not present

## 2018-09-12 DIAGNOSIS — K3189 Other diseases of stomach and duodenum: Secondary | ICD-10-CM | POA: Diagnosis not present

## 2018-09-12 DIAGNOSIS — R112 Nausea with vomiting, unspecified: Secondary | ICD-10-CM | POA: Diagnosis not present

## 2018-09-12 DIAGNOSIS — Z888 Allergy status to other drugs, medicaments and biological substances status: Secondary | ICD-10-CM | POA: Diagnosis not present

## 2018-09-12 DIAGNOSIS — K219 Gastro-esophageal reflux disease without esophagitis: Secondary | ICD-10-CM | POA: Diagnosis not present

## 2018-09-12 DIAGNOSIS — Z79899 Other long term (current) drug therapy: Secondary | ICD-10-CM | POA: Diagnosis not present

## 2018-09-12 DIAGNOSIS — Z853 Personal history of malignant neoplasm of breast: Secondary | ICD-10-CM | POA: Diagnosis not present

## 2018-09-12 DIAGNOSIS — E785 Hyperlipidemia, unspecified: Secondary | ICD-10-CM | POA: Diagnosis not present

## 2018-09-12 DIAGNOSIS — R1310 Dysphagia, unspecified: Secondary | ICD-10-CM | POA: Diagnosis not present

## 2018-09-12 DIAGNOSIS — E039 Hypothyroidism, unspecified: Secondary | ICD-10-CM | POA: Diagnosis not present

## 2018-09-12 DIAGNOSIS — K449 Diaphragmatic hernia without obstruction or gangrene: Secondary | ICD-10-CM | POA: Diagnosis not present

## 2018-09-12 DIAGNOSIS — M1990 Unspecified osteoarthritis, unspecified site: Secondary | ICD-10-CM | POA: Diagnosis not present

## 2018-09-12 DIAGNOSIS — I10 Essential (primary) hypertension: Secondary | ICD-10-CM | POA: Diagnosis not present

## 2018-09-13 MED ORDER — QC CLOTRIMAZOLE 1 % EX CREA
1.00 | TOPICAL_CREAM | CUTANEOUS | Status: DC
Start: ? — End: 2018-09-13

## 2018-09-13 MED ORDER — ETRAFON 2-10 2-10 MG OR TABS
650.00 | ORAL_TABLET | ORAL | Status: DC
Start: ? — End: 2018-09-13

## 2018-09-13 MED ORDER — LEVOTHYROXINE SODIUM 25 MCG PO TABS
50.00 | ORAL_TABLET | ORAL | Status: DC
Start: 2018-09-13 — End: 2018-09-13

## 2018-09-13 MED ORDER — ENOXAPARIN SODIUM 40 MG/0.4ML ~~LOC~~ SOLN
40.00 | SUBCUTANEOUS | Status: DC
Start: 2018-09-13 — End: 2018-09-13

## 2018-09-13 MED ORDER — BARO-CAT PO
10.00 | ORAL | Status: DC
Start: ? — End: 2018-09-13

## 2018-09-13 MED ORDER — ONDANSETRON HCL 4 MG/2ML IJ SOLN
4.00 | INTRAMUSCULAR | Status: DC
Start: 2018-09-12 — End: 2018-09-13

## 2018-09-13 MED ORDER — NITROGLYCERIN 0.4 MG SL SUBL
0.40 | SUBLINGUAL_TABLET | SUBLINGUAL | Status: DC
Start: ? — End: 2018-09-13

## 2018-09-13 MED ORDER — HYDRALAZINE HCL 20 MG/ML IJ SOLN
10.00 | INTRAMUSCULAR | Status: DC
Start: ? — End: 2018-09-13

## 2018-09-13 MED ORDER — GENERIC EXTERNAL MEDICATION
Status: DC
Start: ? — End: 2018-09-13

## 2018-09-13 MED ORDER — PANTOPRAZOLE SODIUM 40 MG PO TBEC
40.00 | DELAYED_RELEASE_TABLET | ORAL | Status: DC
Start: 2018-09-13 — End: 2018-09-13

## 2018-09-13 MED ORDER — HYDROMORPHONE HCL 1 MG/ML IJ SOLN
0.50 | INTRAMUSCULAR | Status: DC
Start: ? — End: 2018-09-13

## 2018-09-13 MED ORDER — GENERIC EXTERNAL MEDICATION
Status: DC
Start: 2018-09-13 — End: 2018-09-13

## 2018-09-13 MED ORDER — PRAVASTATIN SODIUM 10 MG PO TABS
20.00 | ORAL_TABLET | ORAL | Status: DC
Start: 2018-09-12 — End: 2018-09-13

## 2018-09-15 DIAGNOSIS — E079 Disorder of thyroid, unspecified: Secondary | ICD-10-CM | POA: Insufficient documentation

## 2018-09-16 ENCOUNTER — Other Ambulatory Visit: Payer: Self-pay

## 2018-09-16 ENCOUNTER — Encounter: Payer: Self-pay | Admitting: Family Medicine

## 2018-09-16 ENCOUNTER — Other Ambulatory Visit (INDEPENDENT_AMBULATORY_CARE_PROVIDER_SITE_OTHER): Payer: Medicare Other

## 2018-09-16 ENCOUNTER — Ambulatory Visit (INDEPENDENT_AMBULATORY_CARE_PROVIDER_SITE_OTHER): Payer: Medicare Other | Admitting: Family Medicine

## 2018-09-16 VITALS — BP 108/66 | HR 74 | Temp 98.0°F | Resp 16 | Ht 61.5 in | Wt 176.4 lb

## 2018-09-16 DIAGNOSIS — R2681 Unsteadiness on feet: Secondary | ICD-10-CM

## 2018-09-16 DIAGNOSIS — E7800 Pure hypercholesterolemia, unspecified: Secondary | ICD-10-CM

## 2018-09-16 DIAGNOSIS — R531 Weakness: Secondary | ICD-10-CM

## 2018-09-16 DIAGNOSIS — Z23 Encounter for immunization: Secondary | ICD-10-CM

## 2018-09-16 DIAGNOSIS — E039 Hypothyroidism, unspecified: Secondary | ICD-10-CM

## 2018-09-16 DIAGNOSIS — Z0000 Encounter for general adult medical examination without abnormal findings: Secondary | ICD-10-CM | POA: Diagnosis not present

## 2018-09-16 DIAGNOSIS — Z8781 Personal history of (healed) traumatic fracture: Secondary | ICD-10-CM

## 2018-09-16 DIAGNOSIS — I10 Essential (primary) hypertension: Secondary | ICD-10-CM

## 2018-09-16 DIAGNOSIS — K3189 Other diseases of stomach and duodenum: Secondary | ICD-10-CM

## 2018-09-16 DIAGNOSIS — E119 Type 2 diabetes mellitus without complications: Secondary | ICD-10-CM

## 2018-09-16 LAB — LIPID PANEL
Cholesterol: 136 mg/dL (ref 0–200)
HDL: 37.2 mg/dL — ABNORMAL LOW (ref >39.00)
LDL Cholesterol: 80 mg/dL (ref 0–99)
NonHDL: 98.58
Total CHOL/HDL Ratio: 4
Triglycerides: 95 mg/dL (ref 0.0–149.0)
VLDL: 19 mg/dL (ref 0.0–40.0)

## 2018-09-16 LAB — COMPREHENSIVE METABOLIC PANEL
ALT: 15 U/L (ref 0–35)
AST: 18 U/L (ref 0–37)
Albumin: 3.8 g/dL (ref 3.5–5.2)
Alkaline Phosphatase: 22 U/L — ABNORMAL LOW (ref 39–117)
BUN: 19 mg/dL (ref 6–23)
CO2: 31 mEq/L (ref 19–32)
Calcium: 9.2 mg/dL (ref 8.4–10.5)
Chloride: 102 mEq/L (ref 96–112)
Creatinine, Ser: 1.22 mg/dL — ABNORMAL HIGH (ref 0.40–1.20)
GFR: 41.8 mL/min — ABNORMAL LOW (ref >60.00)
Glucose, Bld: 110 mg/dL — ABNORMAL HIGH (ref 70–99)
Potassium: 3.7 mEq/L (ref 3.5–5.1)
Sodium: 143 mEq/L (ref 135–145)
Total Bilirubin: 0.6 mg/dL (ref 0.2–1.2)
Total Protein: 6.4 g/dL (ref 6.0–8.3)

## 2018-09-16 LAB — MICROALBUMIN / CREATININE URINE RATIO
Creatinine,U: 203.1 mg/dL
Microalb Creat Ratio: 1.1 mg/g (ref 0.0–30.0)
Microalb, Ur: 2.2 mg/dL — ABNORMAL HIGH (ref 0.0–1.9)

## 2018-09-16 LAB — CBC WITH DIFFERENTIAL/PLATELET
Basophils Absolute: 0.1 10*3/uL (ref 0.0–0.1)
Basophils Relative: 0.6 % (ref 0.0–3.0)
Eosinophils Absolute: 0.2 10*3/uL (ref 0.0–0.7)
Eosinophils Relative: 2.5 % (ref 0.0–5.0)
HCT: 36.3 % (ref 36.0–46.0)
Hemoglobin: 12.1 g/dL (ref 12.0–15.0)
Lymphocytes Relative: 18.2 % (ref 12.0–46.0)
Lymphs Abs: 1.8 10*3/uL (ref 0.7–4.0)
MCHC: 33.3 g/dL (ref 30.0–36.0)
MCV: 86.6 fl (ref 78.0–100.0)
Monocytes Absolute: 0.8 10*3/uL (ref 0.1–1.0)
Monocytes Relative: 8.3 % (ref 3.0–12.0)
Neutro Abs: 6.9 10*3/uL (ref 1.4–7.7)
Neutrophils Relative %: 70.4 % (ref 43.0–77.0)
Platelets: 189 10*3/uL (ref 150.0–400.0)
RBC: 4.19 Mil/uL (ref 3.87–5.11)
RDW: 13.9 % (ref 11.5–15.5)
WBC: 9.8 10*3/uL (ref 4.0–10.5)

## 2018-09-16 LAB — HEMOGLOBIN A1C: Hgb A1c MFr Bld: 6.7 % — ABNORMAL HIGH (ref 4.6–6.5)

## 2018-09-16 LAB — TSH: TSH: 3.06 u[IU]/mL (ref 0.35–4.50)

## 2018-09-16 LAB — VITAMIN D 25 HYDROXY (VIT D DEFICIENCY, FRACTURES): VITD: 40.74 ng/mL (ref 30.00–100.00)

## 2018-09-16 MED ORDER — VALSARTAN-HYDROCHLOROTHIAZIDE 160-25 MG PO TABS: 1.0000 | ORAL_TABLET | Freq: Every day | ORAL | 3 refills | Status: DC

## 2018-09-16 MED ORDER — OMEPRAZOLE 20 MG PO CPDR: 20.0000 mg | DELAYED_RELEASE_CAPSULE | Freq: Every day | ORAL | 3 refills | Status: DC

## 2018-09-16 MED ORDER — LEVOTHYROXINE SODIUM 50 MCG PO TABS: ORAL_TABLET | ORAL | 3 refills | Status: DC

## 2018-09-16 MED ORDER — SIMVASTATIN 10 MG PO TABS: 10.0000 mg | ORAL_TABLET | Freq: Every day | ORAL | 3 refills | Status: DC

## 2018-09-16 NOTE — Progress Notes (Signed)
Home health order for PT entered 09/16/18.
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
## 2018-09-16 NOTE — Addendum Note (Signed)
Addended by: Tammi Sou on: 09/16/2018 09:28 AM   Modules accepted: Orders

## 2018-09-16 NOTE — Patient Instructions (Signed)
Take 1500 of calcium daily and 1000 Units of vit d daily-->this is for prevention of osteoporosis.  No other vitamins are needed.

## 2018-09-17 ENCOUNTER — Ambulatory Visit: Payer: Medicare Other | Admitting: Family Medicine

## 2018-09-24 DIAGNOSIS — L570 Actinic keratosis: Secondary | ICD-10-CM | POA: Diagnosis not present

## 2018-09-24 DIAGNOSIS — Z08 Encounter for follow-up examination after completed treatment for malignant neoplasm: Secondary | ICD-10-CM | POA: Diagnosis not present

## 2018-09-24 DIAGNOSIS — L821 Other seborrheic keratosis: Secondary | ICD-10-CM | POA: Diagnosis not present

## 2018-09-24 DIAGNOSIS — Z85828 Personal history of other malignant neoplasm of skin: Secondary | ICD-10-CM | POA: Diagnosis not present

## 2018-09-29 ENCOUNTER — Encounter: Payer: Medicare Other | Admitting: Family Medicine

## 2018-10-06 DIAGNOSIS — S42301D Unspecified fracture of shaft of humerus, right arm, subsequent encounter for fracture with routine healing: Secondary | ICD-10-CM | POA: Diagnosis not present

## 2018-10-06 DIAGNOSIS — E1122 Type 2 diabetes mellitus with diabetic chronic kidney disease: Secondary | ICD-10-CM | POA: Diagnosis not present

## 2018-10-06 DIAGNOSIS — I129 Hypertensive chronic kidney disease with stage 1 through stage 4 chronic kidney disease, or unspecified chronic kidney disease: Secondary | ICD-10-CM | POA: Diagnosis not present

## 2018-10-06 DIAGNOSIS — W19XXXD Unspecified fall, subsequent encounter: Secondary | ICD-10-CM | POA: Diagnosis not present

## 2018-10-15 ENCOUNTER — Ambulatory Visit: Payer: Medicare Other | Admitting: Family Medicine

## 2018-10-15 ENCOUNTER — Other Ambulatory Visit: Payer: Self-pay

## 2018-10-22 ENCOUNTER — Other Ambulatory Visit: Payer: Self-pay

## 2018-10-22 ENCOUNTER — Encounter: Payer: Self-pay | Admitting: Family Medicine

## 2018-10-22 ENCOUNTER — Ambulatory Visit (INDEPENDENT_AMBULATORY_CARE_PROVIDER_SITE_OTHER): Payer: Medicare Other | Admitting: Family Medicine

## 2018-10-22 VITALS — BP 123/68 | HR 77 | Temp 98.1°F | Wt 176.0 lb

## 2018-10-22 DIAGNOSIS — R531 Weakness: Secondary | ICD-10-CM | POA: Diagnosis not present

## 2018-10-22 DIAGNOSIS — R1319 Other dysphagia: Secondary | ICD-10-CM | POA: Diagnosis not present

## 2018-10-22 DIAGNOSIS — Z9889 Other specified postprocedural states: Secondary | ICD-10-CM
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
## 2018-11-05 DIAGNOSIS — W19XXXD Unspecified fall, subsequent encounter: Secondary | ICD-10-CM | POA: Diagnosis not present

## 2018-11-05 DIAGNOSIS — I129 Hypertensive chronic kidney disease with stage 1 through stage 4 chronic kidney disease, or unspecified chronic kidney disease: Secondary | ICD-10-CM | POA: Diagnosis not present

## 2018-11-05 DIAGNOSIS — S42301D Unspecified fracture of shaft of humerus, right arm, subsequent encounter for fracture with routine healing: Secondary | ICD-10-CM | POA: Diagnosis not present

## 2018-11-05 DIAGNOSIS — E1122 Type 2 diabetes mellitus with diabetic chronic kidney disease: Secondary | ICD-10-CM | POA: Diagnosis not present

## 2018-12-06 DIAGNOSIS — S42301D Unspecified fracture of shaft of humerus, right arm, subsequent encounter for fracture with routine healing: Secondary | ICD-10-CM | POA: Diagnosis not present

## 2018-12-06 DIAGNOSIS — I129 Hypertensive chronic kidney disease with stage 1 through stage 4 chronic kidney disease, or unspecified chronic kidney disease: Secondary | ICD-10-CM | POA: Diagnosis not present

## 2018-12-06 DIAGNOSIS — W19XXXD Unspecified fall, subsequent encounter: Secondary | ICD-10-CM | POA: Diagnosis not present

## 2018-12-06 DIAGNOSIS — E1122 Type 2 diabetes mellitus with diabetic chronic kidney disease: Secondary | ICD-10-CM | POA: Diagnosis not present

## 2018-12-09 DIAGNOSIS — Z471 Aftercare following joint replacement surgery: Secondary | ICD-10-CM | POA: Diagnosis not present

## 2018-12-09 DIAGNOSIS — Z96611 Presence of right artificial shoulder joint: Secondary | ICD-10-CM | POA: Diagnosis not present

## 2019-02-22 ENCOUNTER — Ambulatory Visit: Payer: Medicare Other

## 2019-03-24 ENCOUNTER — Encounter: Payer: Self-pay | Admitting: Family Medicine

## 2019-03-24 ENCOUNTER — Ambulatory Visit (INDEPENDENT_AMBULATORY_CARE_PROVIDER_SITE_OTHER): Payer: Medicare Other | Admitting: Family Medicine

## 2019-03-24 ENCOUNTER — Other Ambulatory Visit: Payer: Self-pay

## 2019-03-24 VITALS — BP 127/66 | HR 72 | Temp 98.0°F

## 2019-03-24 DIAGNOSIS — E039 Hypothyroidism, unspecified: Secondary | ICD-10-CM

## 2019-03-24 DIAGNOSIS — N1831 Chronic kidney disease, stage 3a: Secondary | ICD-10-CM | POA: Diagnosis not present

## 2019-03-24 DIAGNOSIS — E119 Type 2 diabetes mellitus without complications: Secondary | ICD-10-CM | POA: Diagnosis not present

## 2019-03-24 DIAGNOSIS — E7800 Pure hypercholesterolemia, unspecified: Secondary | ICD-10-CM

## 2019-03-24 DIAGNOSIS — I10 Essential (primary) hypertension: Secondary | ICD-10-CM
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
## 2019-04-22 ENCOUNTER — Ambulatory Visit (INDEPENDENT_AMBULATORY_CARE_PROVIDER_SITE_OTHER): Payer: Medicare Other

## 2019-04-22 ENCOUNTER — Other Ambulatory Visit: Payer: Self-pay

## 2019-04-22 DIAGNOSIS — E119 Type 2 diabetes mellitus without complications: Secondary | ICD-10-CM

## 2019-04-22 DIAGNOSIS — I10 Essential (primary) hypertension: Secondary | ICD-10-CM

## 2019-04-22 DIAGNOSIS — N1831 Chronic kidney disease, stage 3a: Secondary | ICD-10-CM | POA: Diagnosis not present

## 2019-04-22 LAB — BASIC METABOLIC PANEL
BUN: 25 mg/dL — ABNORMAL HIGH (ref 6–23)
CO2: 26 mEq/L (ref 19–32)
Calcium: 9.9 mg/dL (ref 8.4–10.5)
Chloride: 102 mEq/L (ref 96–112)
Creatinine, Ser: 1.19 mg/dL (ref 0.40–1.20)
GFR: 42.96 mL/min — ABNORMAL LOW (ref >60.00)
Glucose, Bld: 131 mg/dL — ABNORMAL HIGH (ref 70–99)
Potassium: 4.3 mEq/L (ref 3.5–5.1)
Sodium: 139 mEq/L (ref 135–145)

## 2019-04-22 LAB — POCT GLYCOSYLATED HEMOGLOBIN (HGB A1C)
HbA1c POC (<> result, manual entry): 6 % (ref 4.0–5.6)
HbA1c, POC (controlled diabetic range): 6 % (ref 0.0–7.0)
HbA1c, POC (prediabetic range): 6 % (ref 5.7–6.4)
Hemoglobin A1C: 6 % — AB (ref 4.0–5.6)

## 2019-04-22 NOTE — Progress Notes (Signed)
Pt came for lab visit and was unable to draw enough blood to get A1C. POC A1C completed. BMP sent to [facility] to Dr Anitra Lauth to sign/review.

## 2019-05-03 NOTE — Progress Notes (Signed)
Noted/agree. Signed:  Crissie Sickles, MD           05/03/2019

## 2019-05-18 DIAGNOSIS — M961 Postlaminectomy syndrome, not elsewhere classified: Secondary | ICD-10-CM | POA: Diagnosis not present

## 2019-05-18 DIAGNOSIS — M5136 Other intervertebral disc degeneration, lumbar region: Secondary | ICD-10-CM | POA: Diagnosis not present

## 2019-06-16 ENCOUNTER — Other Ambulatory Visit: Payer: Self-pay | Admitting: Family Medicine

## 2019-07-26 ENCOUNTER — Other Ambulatory Visit: Payer: Self-pay | Admitting: Surgery

## 2019-07-26 ENCOUNTER — Other Ambulatory Visit: Payer: Self-pay | Admitting: Family Medicine

## 2019-07-26 DIAGNOSIS — Z1231 Encounter for screening mammogram for malignant neoplasm of breast: Secondary | ICD-10-CM

## 2019-09-09 DIAGNOSIS — M5136 Other intervertebral disc degeneration, lumbar region: Secondary | ICD-10-CM | POA: Diagnosis not present

## 2019-09-15 IMAGING — [facility] DIGITAL DIAGNOSTIC BILATERAL MAMMOGRAM WITH TOMO AND CAD
6 of 11 series · 6 of 31 positions shown · non-contrast
Comparison: Previous exam(s).

CLINICAL DATA: Status post right lumpectomy performed in 7625.
fat necrosis.Patient has no current breast complaints.

EXAM:
DIGITAL DIAGNOSTIC BILATERAL MAMMOGRAM WITH CAD AND TOMO

[R MLO]
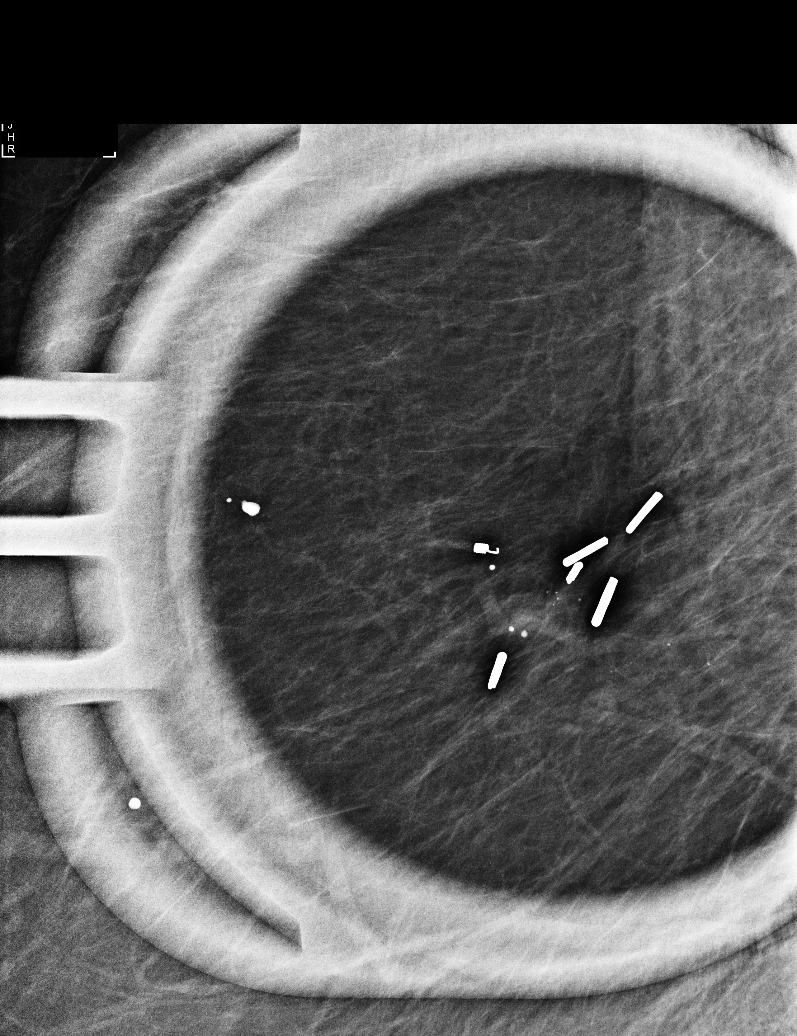

[R CC synth-2D]
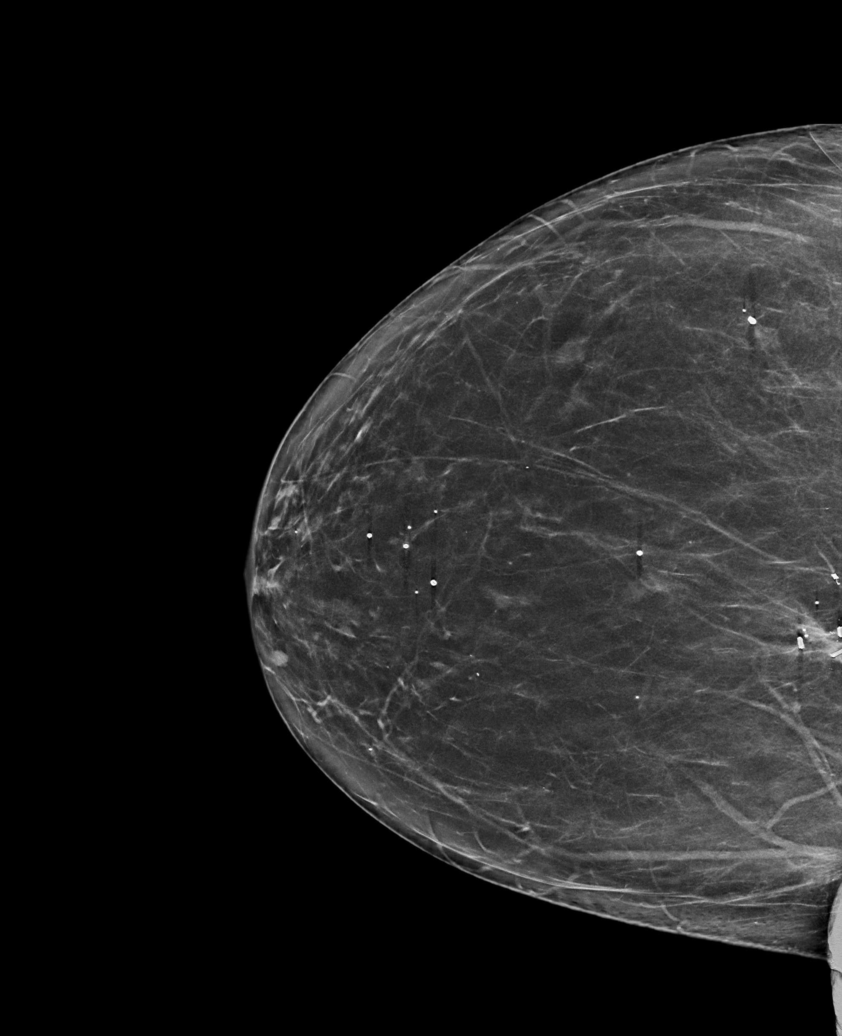

[L CC synth-2D]
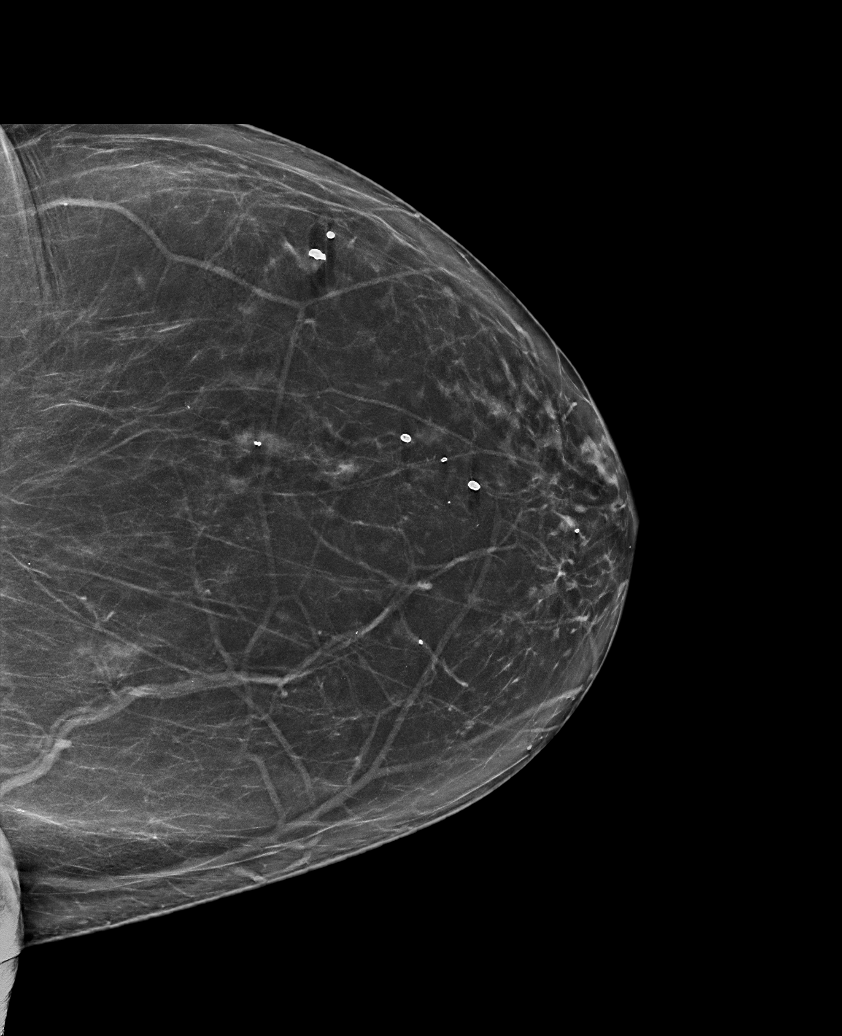

[R XCCL synth-2D]
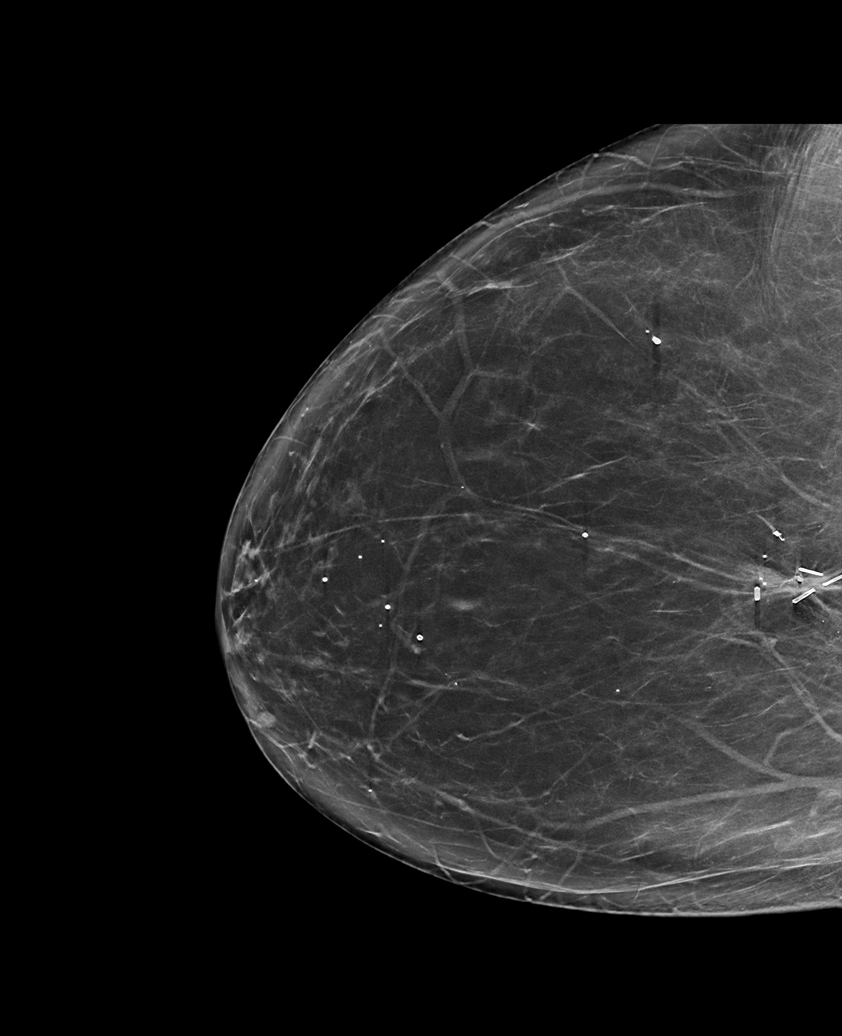

[R MLO synth-2D]
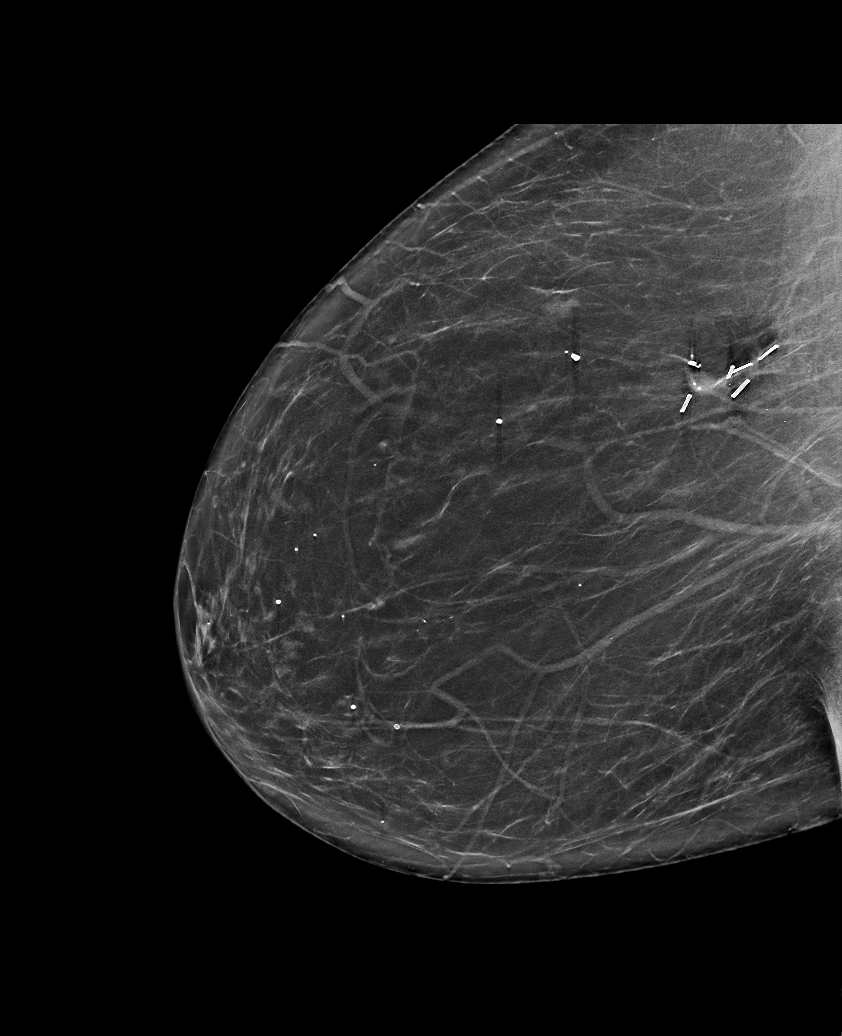

[L MLO synth-2D]
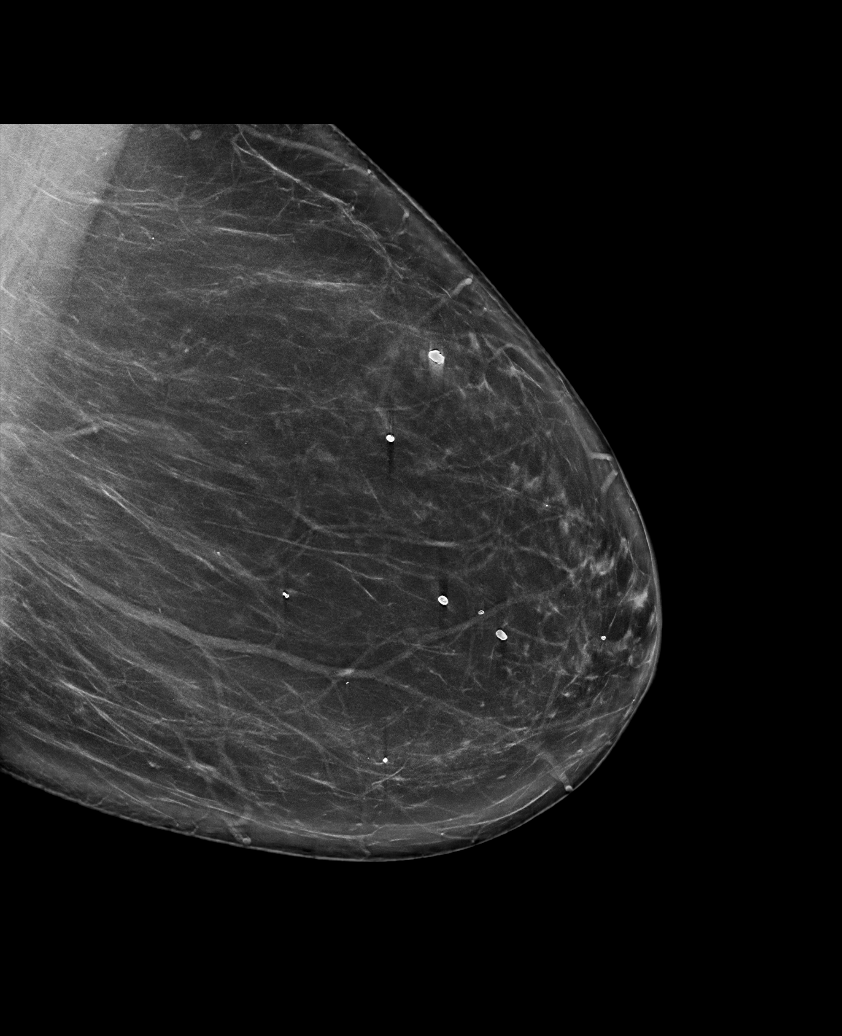

[6 of 31 positions shown; findings below may reference images not displayed]

ACR Breast Density Category b: There are scattered areas of
fibroglandular density.
FINDINGS: There are no new or suspicious masses. Postsurgical changes in the
posterior, upper and slightly inner aspect of the right breast are
stable. There are no areas of nonsurgical architectural distortion.
There are no new or suspicious calcifications.

Mammographic images were processed with CAD.
IMPRESSION: 1. No evidence of new or recurrent breast carcinoma.
2. Benign postsurgical changes on the right.

RECOMMENDATION:
Screening mammogram in one year.(Code:[id number])

I have discussed the findings and recommendations with the patient.
Results were also provided in writing at the conclusion of the
visit. If applicable, a reminder letter will be sent to the patient
regarding the next appointment.

BI-RADS CATEGORY  2: Benign.

## 2019-09-22 ENCOUNTER — Telehealth: Payer: Self-pay

## 2019-09-22 NOTE — Telephone Encounter (Signed)
Patient called she has had stomach issues x 1 week.  Had to call EMS early this morning (3AM) because she woke up to go to the bathroom and the toilet had blood had blood in it.  She was very worried.  They asked her a lot of questions and though it could be that she had hemorrhoids to "bust" as she stated because of all the recent trips to the bathroom with her stomach issues/diarrhea etc.  She is scheduled to see Dr. Anitra Lauth next week.  She felt as she could wait for the appt, she is doing fine now, no itching or burning.  Please call to check in on patient [phone] and if Dr. Anitra Lauth could suggest something OTC IF pain, burning or itching start she will already have on hand.  [facility] will deliver to her today if called in before a certain time.

## 2019-09-22 NOTE — Telephone Encounter (Signed)
OTC hydrocortisone ointment, apply twice a day to anal area for hemorrhoid pain. Also, get "sitz bath" and do this twice daily for 15 min. Drink lots of clear fluids!

## 2019-09-22 NOTE — Telephone Encounter (Signed)
Please advise, thanks.

## 2019-09-22 NOTE — Telephone Encounter (Signed)
Patient advised of recommendations.  

## 2019-09-27 ENCOUNTER — Other Ambulatory Visit: Payer: Self-pay

## 2019-09-28 DIAGNOSIS — M533 Sacrococcygeal disorders, not elsewhere classified: Secondary | ICD-10-CM | POA: Diagnosis not present

## 2019-09-29 ENCOUNTER — Ambulatory Visit (INDEPENDENT_AMBULATORY_CARE_PROVIDER_SITE_OTHER): Payer: Medicare Other | Admitting: Family Medicine

## 2019-09-29 ENCOUNTER — Other Ambulatory Visit: Payer: Self-pay

## 2019-09-29 ENCOUNTER — Other Ambulatory Visit: Payer: Self-pay | Admitting: Family Medicine

## 2019-09-29 ENCOUNTER — Encounter: Payer: Self-pay | Admitting: Family Medicine

## 2019-09-29 VITALS — BP 157/76 | HR 71 | Temp 97.3°F | Resp 16 | Wt 177.6 lb

## 2019-09-29 DIAGNOSIS — E7800 Pure hypercholesterolemia, unspecified: Secondary | ICD-10-CM | POA: Diagnosis not present

## 2019-09-29 DIAGNOSIS — I10 Essential (primary) hypertension: Secondary | ICD-10-CM | POA: Diagnosis not present

## 2019-09-29 DIAGNOSIS — E119 Type 2 diabetes mellitus without complications: Secondary | ICD-10-CM

## 2019-09-29 DIAGNOSIS — E039 Hypothyroidism, unspecified: Secondary | ICD-10-CM

## 2019-09-29 DIAGNOSIS — N183 Chronic kidney disease, stage 3 unspecified: Secondary | ICD-10-CM | POA: Diagnosis not present

## 2019-09-29 LAB — COMPREHENSIVE METABOLIC PANEL
ALT: 13 U/L (ref 0–35)
AST: 17 U/L (ref 0–37)
Albumin: 4.3 g/dL (ref 3.5–5.2)
Alkaline Phosphatase: 21 U/L — ABNORMAL LOW (ref 39–117)
BUN: 28 mg/dL — ABNORMAL HIGH (ref 6–23)
CO2: 25 mEq/L (ref 19–32)
Calcium: 9.8 mg/dL (ref 8.4–10.5)
Chloride: 101 mEq/L (ref 96–112)
Creatinine, Ser: 1.41 mg/dL — ABNORMAL HIGH (ref 0.40–1.20)
GFR: 35.28 mL/min — ABNORMAL LOW (ref >60.00)
Glucose, Bld: 110 mg/dL — ABNORMAL HIGH (ref 70–99)
Potassium: 4.6 mEq/L (ref 3.5–5.1)
Sodium: 139 mEq/L (ref 135–145)
Total Bilirubin: 0.4 mg/dL (ref 0.2–1.2)
Total Protein: 6.9 g/dL (ref 6.0–8.3)

## 2019-09-29 LAB — LIPID PANEL
Cholesterol: 150 mg/dL (ref 0–200)
HDL: 34.6 mg/dL — ABNORMAL LOW (ref >39.00)
LDL Cholesterol: 80 mg/dL (ref 0–99)
NonHDL: 115.3
Total CHOL/HDL Ratio: 4
Triglycerides: 176 mg/dL — ABNORMAL HIGH (ref 0.0–149.0)
VLDL: 35.2 mg/dL (ref 0.0–40.0)

## 2019-09-29 LAB — HEMOGLOBIN A1C: Hgb A1c MFr Bld: 7.1 % — ABNORMAL HIGH (ref 4.6–6.5)

## 2019-09-29 LAB — MICROALBUMIN / CREATININE URINE RATIO
Creatinine,U: 184.3 mg/dL
Microalb Creat Ratio: 1.5 mg/g (ref 0.0–30.0)
Microalb, Ur: 2.7 mg/dL — ABNORMAL HIGH (ref 0.0–1.9)

## 2019-09-29 LAB — TSH: TSH: 2.96 u[IU]/mL (ref 0.35–4.50)
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
## 2019-09-30 NOTE — Progress Notes (Signed)
Pt verified understand and wants to work on it

## 2019-10-01 DIAGNOSIS — M533 Sacrococcygeal disorders, not elsewhere classified: Secondary | ICD-10-CM | POA: Insufficient documentation

## 2019-10-06 ENCOUNTER — Ambulatory Visit: Payer: Medicare Other

## 2019-10-06 ENCOUNTER — Encounter: Payer: Self-pay | Admitting: Family Medicine

## 2019-10-06 DIAGNOSIS — M533 Sacrococcygeal disorders, not elsewhere classified: Secondary | ICD-10-CM | POA: Diagnosis not present

## 2019-10-13 ENCOUNTER — Ambulatory Visit
Admission: RE | Admit: 2019-10-13 | Discharge: 2019-10-13 | Disposition: A | Discharge status: Home / Self Care | Payer: Medicare Other | Source: Ambulatory Visit | Attending: Surgery | Admitting: Surgery

## 2019-10-13 ENCOUNTER — Other Ambulatory Visit: Payer: Self-pay

## 2019-10-13 DIAGNOSIS — Z1231 Encounter for screening mammogram for malignant neoplasm of breast: Secondary | ICD-10-CM | POA: Diagnosis not present

## 2019-11-04 DIAGNOSIS — M5416 Radiculopathy, lumbar region: Secondary | ICD-10-CM | POA: Diagnosis not present

## 2019-11-06 ENCOUNTER — Encounter: Payer: Self-pay | Admitting: Family Medicine

## 2019-11-22 ENCOUNTER — Other Ambulatory Visit: Payer: Self-pay

## 2019-11-22 NOTE — Telephone Encounter (Signed)
Patient requests to go back on prescription for heartburn.  She states she was taken off of this med last year by surgeon who done her hernia surgery. She stated that he told her "she would not need it any longer surgery."  She is having a lot of acid reflux and has to sleep in chair or upright position for awhile now and is tired of symptoms.  Patient can be reached at [phone].  Omeprazole 20mg    [place]

## 2019-11-23 MED ORDER — OMEPRAZOLE 20 MG PO CPDR
20.0000 mg | DELAYED_RELEASE_CAPSULE | Freq: Every day | ORAL | 3 refills | Status: DC
Start: 2019-11-23 — End: 2020-11-15

## 2019-11-23 NOTE — Telephone Encounter (Signed)
Left detailed message advising refill sent for 90 day supply with 3 refills. Okay per [facility]

## 2019-11-23 NOTE — Telephone Encounter (Signed)
Pt was last seen 09/29/19 and next appt scheduled 01/31/20. Medication currently pending. Pt does have GI provider, Dr.Mann but previous prescription written by you.  Please advise, thanks. Medication pending

## 2019-12-02 DIAGNOSIS — M5136 Other intervertebral disc degeneration, lumbar region: Secondary | ICD-10-CM | POA: Diagnosis not present

## 2019-12-11 ENCOUNTER — Other Ambulatory Visit: Payer: Self-pay | Admitting: Family Medicine

## 2019-12-13 NOTE — Telephone Encounter (Signed)
RPH, Evelena Peat from [facility] calling about refill because patient states she is going out of town  Please call Evelena Peat at [facility].

## 2019-12-14 DIAGNOSIS — L821 Other seborrheic keratosis: Secondary | ICD-10-CM | POA: Diagnosis not present

## 2019-12-14 DIAGNOSIS — L570 Actinic keratosis: Secondary | ICD-10-CM | POA: Diagnosis not present

## 2019-12-14 DIAGNOSIS — D485 Neoplasm of uncertain behavior of skin: Secondary | ICD-10-CM | POA: Diagnosis not present

## 2019-12-14 DIAGNOSIS — Z85828 Personal history of other malignant neoplasm of skin: Secondary | ICD-10-CM | POA: Diagnosis not present

## 2020-01-03 ENCOUNTER — Ambulatory Visit: Payer: Medicare Other | Admitting: Family Medicine

## 2020-01-24 ENCOUNTER — Other Ambulatory Visit: Payer: Self-pay | Admitting: Family Medicine

## 2020-01-31 ENCOUNTER — Ambulatory Visit: Payer: Medicare Other | Admitting: Family Medicine

## 2020-02-01 DIAGNOSIS — L578 Other skin changes due to chronic exposure to nonionizing radiation: Secondary | ICD-10-CM | POA: Diagnosis not present

## 2020-02-01 DIAGNOSIS — L570 Actinic keratosis: Secondary | ICD-10-CM | POA: Diagnosis not present

## 2020-02-02 ENCOUNTER — Other Ambulatory Visit: Payer: Self-pay

## 2020-02-02 ENCOUNTER — Ambulatory Visit: Payer: Medicare Other | Admitting: Family Medicine

## 2020-02-02 ENCOUNTER — Ambulatory Visit (INDEPENDENT_AMBULATORY_CARE_PROVIDER_SITE_OTHER): Payer: Medicare Other | Admitting: Family Medicine

## 2020-02-02 ENCOUNTER — Encounter: Payer: Self-pay | Admitting: Family Medicine

## 2020-02-02 VITALS — BP 126/74 | HR 71 | Temp 97.6°F | Resp 16 | Ht 61.5 in | Wt 172.8 lb

## 2020-02-02 DIAGNOSIS — N183 Chronic kidney disease, stage 3 unspecified: Secondary | ICD-10-CM

## 2020-02-02 DIAGNOSIS — I10 Essential (primary) hypertension: Secondary | ICD-10-CM | POA: Diagnosis not present

## 2020-02-02 DIAGNOSIS — E119 Type 2 diabetes mellitus without complications: Secondary | ICD-10-CM

## 2020-02-02 LAB — BASIC METABOLIC PANEL
BUN: 29 mg/dL — ABNORMAL HIGH (ref 6–23)
CO2: 28 mEq/L (ref 19–32)
Calcium: 9.4 mg/dL (ref 8.4–10.5)
Chloride: 105 mEq/L (ref 96–112)
Creatinine, Ser: 1.26 mg/dL — ABNORMAL HIGH (ref 0.40–1.20)
GFR: 38.44 mL/min — ABNORMAL LOW (ref >60.00)
Glucose, Bld: 112 mg/dL — ABNORMAL HIGH (ref 70–99)
Potassium: 4.1 mEq/L (ref 3.5–5.1)
Sodium: 140 mEq/L (ref 135–145)

## 2020-02-02 LAB — CBC WITH DIFFERENTIAL/PLATELET
Basophils Absolute: 0.1 10*3/uL (ref 0.0–0.1)
Basophils Relative: 0.7 % (ref 0.0–3.0)
Eosinophils Absolute: 0.2 10*3/uL (ref 0.0–0.7)
Eosinophils Relative: 2.5 % (ref 0.0–5.0)
HCT: 38.1 % (ref 36.0–46.0)
Hemoglobin: 12.7 g/dL (ref 12.0–15.0)
Lymphocytes Relative: 23 % (ref 12.0–46.0)
Lymphs Abs: 1.8 10*3/uL (ref 0.7–4.0)
MCHC: 33.3 g/dL (ref 30.0–36.0)
MCV: 89.1 fl (ref 78.0–100.0)
Monocytes Absolute: 0.6 10*3/uL (ref 0.1–1.0)
Monocytes Relative: 7.2 % (ref 3.0–12.0)
Neutro Abs: 5.1 10*3/uL (ref 1.4–7.7)
Neutrophils Relative %: 66.6 % (ref 43.0–77.0)
Platelets: 84 10*3/uL — ABNORMAL LOW (ref 150.0–400.0)
RBC: 4.28 Mil/uL (ref 3.87–5.11)
RDW: 12.8 % (ref 11.5–15.5)
WBC: 7.7 10*3/uL (ref 4.0–10.5)

## 2020-02-02 LAB — HEMOGLOBIN A1C: Hgb A1c MFr Bld: 6.4 % (ref 4.6–6.5)
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
## 2020-03-16 ENCOUNTER — Other Ambulatory Visit: Payer: Self-pay | Admitting: Family Medicine

## 2020-03-28 ENCOUNTER — Encounter: Payer: Medicare Other | Admitting: Family Medicine

## 2020-04-18 ENCOUNTER — Other Ambulatory Visit: Payer: Self-pay | Admitting: Family Medicine

## 2020-05-04 ENCOUNTER — Encounter: Payer: Medicare Other | Admitting: Family Medicine

## 2020-05-10 DIAGNOSIS — Z23 Encounter for immunization: Secondary | ICD-10-CM | POA: Diagnosis not present

## 2020-05-17 ENCOUNTER — Ambulatory Visit (INDEPENDENT_AMBULATORY_CARE_PROVIDER_SITE_OTHER): Payer: Medicare Other | Admitting: Family Medicine

## 2020-05-17 ENCOUNTER — Encounter: Payer: Self-pay | Admitting: Family Medicine

## 2020-05-17 ENCOUNTER — Other Ambulatory Visit: Payer: Self-pay

## 2020-05-17 VITALS — BP 96/60 | HR 83 | Temp 98.0°F | Resp 16 | Ht 60.25 in | Wt 169.8 lb

## 2020-05-17 DIAGNOSIS — I10 Essential (primary) hypertension: Secondary | ICD-10-CM

## 2020-05-17 DIAGNOSIS — D696 Thrombocytopenia, unspecified: Secondary | ICD-10-CM

## 2020-05-17 DIAGNOSIS — N183 Chronic kidney disease, stage 3 unspecified: Secondary | ICD-10-CM | POA: Diagnosis not present

## 2020-05-17 DIAGNOSIS — E039 Hypothyroidism, unspecified: Secondary | ICD-10-CM | POA: Diagnosis not present

## 2020-05-17 DIAGNOSIS — E119 Type 2 diabetes mellitus without complications: Secondary | ICD-10-CM

## 2020-05-17 DIAGNOSIS — Z0000 Encounter for general adult medical examination without abnormal findings: Secondary | ICD-10-CM

## 2020-05-17 LAB — COMPREHENSIVE METABOLIC PANEL
ALT: 12 U/L (ref 0–35)
AST: 17 U/L (ref 0–37)
Albumin: 4.5 g/dL (ref 3.5–5.2)
Alkaline Phosphatase: 22 U/L — ABNORMAL LOW (ref 39–117)
BUN: 33 mg/dL — ABNORMAL HIGH (ref 6–23)
CO2: 27 mEq/L (ref 19–32)
Calcium: 9.6 mg/dL (ref 8.4–10.5)
Chloride: 104 mEq/L (ref 96–112)
Creatinine, Ser: 1.28 mg/dL — ABNORMAL HIGH (ref 0.40–1.20)
GFR: 37.65 mL/min — ABNORMAL LOW (ref >60.00)
Glucose, Bld: 113 mg/dL — ABNORMAL HIGH (ref 70–99)
Potassium: 4.2 mEq/L (ref 3.5–5.1)
Sodium: 141 mEq/L (ref 135–145)
Total Bilirubin: 0.5 mg/dL (ref 0.2–1.2)
Total Protein: 6.6 g/dL (ref 6.0–8.3)

## 2020-05-17 LAB — CBC WITH DIFFERENTIAL/PLATELET
Basophils Absolute: 0.1 10*3/uL (ref 0.0–0.1)
Basophils Relative: 0.8 % (ref 0.0–3.0)
Eosinophils Absolute: 0.1 10*3/uL (ref 0.0–0.7)
Eosinophils Relative: 1.5 % (ref 0.0–5.0)
HCT: 36.2 % (ref 36.0–46.0)
Hemoglobin: 12.3 g/dL (ref 12.0–15.0)
Lymphocytes Relative: 26.1 % (ref 12.0–46.0)
Lymphs Abs: 1.6 10*3/uL (ref 0.7–4.0)
MCHC: 34.1 g/dL (ref 30.0–36.0)
MCV: 87.5 fl (ref 78.0–100.0)
Monocytes Absolute: 0.5 10*3/uL (ref 0.1–1.0)
Monocytes Relative: 7.3 % (ref 3.0–12.0)
Neutro Abs: 4 10*3/uL (ref 1.4–7.7)
Neutrophils Relative %: 64.3 % (ref 43.0–77.0)
Platelets: 85 10*3/uL — ABNORMAL LOW (ref 150.0–400.0)
RBC: 4.14 Mil/uL (ref 3.87–5.11)
RDW: 13.1 % (ref 11.5–15.5)
WBC: 6.3 10*3/uL (ref 4.0–10.5)

## 2020-05-17 LAB — HEMOGLOBIN A1C: Hgb A1c MFr Bld: 6.5 % (ref 4.6–6.5)

## 2020-05-17 LAB — LIPID PANEL
Cholesterol: 153 mg/dL (ref 0–200)
HDL: 37.3 mg/dL — ABNORMAL LOW (ref >39.00)
LDL Cholesterol: 90 mg/dL (ref 0–99)
NonHDL: 115.96
Total CHOL/HDL Ratio: 4
Triglycerides: 128 mg/dL (ref 0.0–149.0)
VLDL: 25.6 mg/dL (ref 0.0–40.0)

## 2020-05-17 LAB — TSH: TSH: 1.96 u[IU]/mL (ref 0.35–4.50)
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
## 2020-06-15 ENCOUNTER — Other Ambulatory Visit: Payer: Self-pay | Admitting: Family Medicine

## 2020-06-21 ENCOUNTER — Telehealth: Payer: Self-pay

## 2020-06-21 DIAGNOSIS — K529 Noninfective gastroenteritis and colitis, unspecified: Secondary | ICD-10-CM | POA: Diagnosis not present

## 2020-06-21 DIAGNOSIS — Z888 Allergy status to other drugs, medicaments and biological substances status: Secondary | ICD-10-CM | POA: Diagnosis not present

## 2020-06-21 DIAGNOSIS — Z743 Need for continuous supervision: Secondary | ICD-10-CM | POA: Diagnosis not present

## 2020-06-21 DIAGNOSIS — Z886 Allergy status to analgesic agent status: Secondary | ICD-10-CM | POA: Diagnosis not present

## 2020-06-21 DIAGNOSIS — N3090 Cystitis, unspecified without hematuria: Secondary | ICD-10-CM | POA: Diagnosis not present

## 2020-06-21 DIAGNOSIS — E785 Hyperlipidemia, unspecified: Secondary | ICD-10-CM | POA: Diagnosis not present

## 2020-06-21 DIAGNOSIS — E079 Disorder of thyroid, unspecified: Secondary | ICD-10-CM | POA: Diagnosis not present

## 2020-06-21 DIAGNOSIS — N3000 Acute cystitis without hematuria: Secondary | ICD-10-CM | POA: Diagnosis not present

## 2020-06-21 DIAGNOSIS — R112 Nausea with vomiting, unspecified: Secondary | ICD-10-CM | POA: Diagnosis not present

## 2020-06-21 DIAGNOSIS — R6889 Other general symptoms and signs: Secondary | ICD-10-CM | POA: Diagnosis not present

## 2020-06-21 DIAGNOSIS — N281 Cyst of kidney, acquired: Secondary | ICD-10-CM | POA: Diagnosis not present

## 2020-06-21 DIAGNOSIS — Z883 Allergy status to other anti-infective agents status: Secondary | ICD-10-CM | POA: Diagnosis not present

## 2020-06-21 DIAGNOSIS — R0902 Hypoxemia: Secondary | ICD-10-CM | POA: Diagnosis not present

## 2020-06-21 DIAGNOSIS — Z79899 Other long term (current) drug therapy: Secondary | ICD-10-CM | POA: Diagnosis not present

## 2020-06-21 DIAGNOSIS — R197 Diarrhea, unspecified: Secondary | ICD-10-CM | POA: Diagnosis not present

## 2020-06-21 DIAGNOSIS — K6389 Other specified diseases of intestine: Secondary | ICD-10-CM | POA: Diagnosis not present

## 2020-06-21 DIAGNOSIS — I10 Essential (primary) hypertension: Secondary | ICD-10-CM | POA: Diagnosis not present

## 2020-06-21 NOTE — Telephone Encounter (Signed)
She thinks she may have a stomach virus, has been vomiting pretty much all night.  Her blood pressure has been up too during this time.  I transferred her to Triage nurse.  I offered her an appt but she declined because she does not have smart phone or anyway to do virtual appt. She does not feel well at all.  Please advise.  Patient can be reached at [phone].

## 2020-06-21 NOTE — Telephone Encounter (Signed)
LM for pt to return call regarding sxs.

## 2020-06-21 NOTE — Telephone Encounter (Signed)
Spoke with patient and her most recent BP was from this morning 182/96, tried to take her pill but unable to keep water down, vomited 8-10 times since 1am. Triage Nurse did not have recommendations and pt did not want to continue speaking with her, preferred to speak with someone from our office.   Please Advise.

## 2020-06-21 NOTE — Telephone Encounter (Signed)
Any diarrhea? Able to keep small sips of fluids down? Any abdominal pain?

## 2020-06-22 NOTE — Telephone Encounter (Signed)
Noted.  Care Everywhere note from ED visit reviewed. Viral GE sx's, blood unremarkable, UA +nitrite and WBCs, CT abd/pelv showed changes c/w small bowel enteritis. Pt covered for uti with keflex and urine clx ordered per records.  No urine clx result available at this time. Signed:  Crissie Sickles, MD           06/22/2020

## 2020-06-22 NOTE — Telephone Encounter (Signed)
FYI. Please see below.  Pt seen at [facility] ED yesterday.

## 2020-07-01 ENCOUNTER — Other Ambulatory Visit: Payer: Self-pay | Admitting: Family Medicine

## 2020-07-05 ENCOUNTER — Other Ambulatory Visit: Payer: Self-pay

## 2020-07-05 ENCOUNTER — Ambulatory Visit (INDEPENDENT_AMBULATORY_CARE_PROVIDER_SITE_OTHER): Payer: Medicare Other | Admitting: Family Medicine

## 2020-07-05 ENCOUNTER — Encounter: Payer: Self-pay | Admitting: Family Medicine

## 2020-07-05 VITALS — BP 114/75 | HR 85 | Temp 97.6°F | Wt 170.2 lb

## 2020-07-05 DIAGNOSIS — K529 Noninfective gastroenteritis and colitis, unspecified: Secondary | ICD-10-CM | POA: Diagnosis not present

## 2020-07-05 DIAGNOSIS — N3000 Acute cystitis without hematuria: Secondary | ICD-10-CM
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
## 2020-07-06 ENCOUNTER — Other Ambulatory Visit: Payer: Self-pay | Admitting: Family Medicine

## 2020-07-19 ENCOUNTER — Other Ambulatory Visit: Payer: Self-pay | Admitting: Family Medicine

## 2020-08-17 ENCOUNTER — Encounter: Payer: Self-pay | Admitting: Family Medicine

## 2020-08-17 ENCOUNTER — Ambulatory Visit (INDEPENDENT_AMBULATORY_CARE_PROVIDER_SITE_OTHER): Payer: Medicare Other | Admitting: Family Medicine

## 2020-08-17 ENCOUNTER — Other Ambulatory Visit: Payer: Self-pay

## 2020-08-17 VITALS — BP 118/64 | HR 79 | Temp 97.7°F | Resp 14 | Ht 60.25 in | Wt 171.0 lb

## 2020-08-17 DIAGNOSIS — I10 Essential (primary) hypertension: Secondary | ICD-10-CM | POA: Diagnosis not present

## 2020-08-17 DIAGNOSIS — E1140 Type 2 diabetes mellitus with diabetic neuropathy, unspecified: Secondary | ICD-10-CM | POA: Diagnosis not present

## 2020-08-17 DIAGNOSIS — N183 Chronic kidney disease, stage 3 unspecified: Secondary | ICD-10-CM

## 2020-08-17 DIAGNOSIS — M10071 Idiopathic gout, right ankle and foot: Secondary | ICD-10-CM | POA: Diagnosis not present

## 2020-08-17 LAB — HEMOGLOBIN A1C: Hgb A1c MFr Bld: 6.5 % (ref 4.6–6.5)

## 2020-08-17 LAB — BASIC METABOLIC PANEL
BUN: 27 mg/dL — ABNORMAL HIGH (ref 6–23)
CO2: 28 mEq/L (ref 19–32)
Calcium: 9.4 mg/dL (ref 8.4–10.5)
Chloride: 102 mEq/L (ref 96–112)
Creatinine, Ser: 1.27 mg/dL — ABNORMAL HIGH (ref 0.40–1.20)
GFR: 37.93 mL/min — ABNORMAL LOW (ref >60.00)
Glucose, Bld: 114 mg/dL — ABNORMAL HIGH (ref 70–99)
Potassium: 4.4 mEq/L (ref 3.5–5.1)
Sodium: 140 mEq/L (ref 135–145)

## 2020-08-17 LAB — URIC ACID: Uric Acid, Serum: 8.3 mg/dL — ABNORMAL HIGH (ref 2.4–7.0)
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
## 2020-08-21 ENCOUNTER — Other Ambulatory Visit: Payer: Self-pay | Admitting: Family Medicine

## 2020-09-14 ENCOUNTER — Other Ambulatory Visit: Payer: Self-pay | Admitting: Family Medicine

## 2020-09-19 ENCOUNTER — Other Ambulatory Visit: Payer: Self-pay | Admitting: Surgery

## 2020-09-19 ENCOUNTER — Other Ambulatory Visit: Payer: Self-pay | Admitting: Family Medicine

## 2020-09-19 DIAGNOSIS — Z1231 Encounter for screening mammogram for malignant neoplasm of breast: Secondary | ICD-10-CM

## 2020-10-06 ENCOUNTER — Ambulatory Visit: Payer: Medicare Other

## 2020-10-07 ENCOUNTER — Ambulatory Visit (INDEPENDENT_AMBULATORY_CARE_PROVIDER_SITE_OTHER): Payer: Medicare Other

## 2020-10-07 DIAGNOSIS — Z0000 Encounter for general adult medical examination without abnormal findings: Secondary | ICD-10-CM
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
## 2020-10-16 ENCOUNTER — Other Ambulatory Visit: Payer: Self-pay | Admitting: Family Medicine

## 2020-11-15 ENCOUNTER — Other Ambulatory Visit: Payer: Self-pay | Admitting: Family Medicine

## 2020-11-21 ENCOUNTER — Encounter: Payer: Self-pay | Admitting: Family Medicine

## 2020-11-21 ENCOUNTER — Ambulatory Visit (INDEPENDENT_AMBULATORY_CARE_PROVIDER_SITE_OTHER): Payer: Medicare Other | Admitting: Family Medicine

## 2020-11-21 ENCOUNTER — Other Ambulatory Visit: Payer: Self-pay

## 2020-11-21 VITALS — BP 123/74 | HR 74 | Temp 97.6°F | Ht 60.25 in | Wt 171.0 lb

## 2020-11-21 DIAGNOSIS — I10 Essential (primary) hypertension: Secondary | ICD-10-CM | POA: Diagnosis not present

## 2020-11-21 DIAGNOSIS — E7800 Pure hypercholesterolemia, unspecified: Secondary | ICD-10-CM | POA: Diagnosis not present

## 2020-11-21 DIAGNOSIS — M10071 Idiopathic gout, right ankle and foot: Secondary | ICD-10-CM | POA: Diagnosis not present

## 2020-11-21 DIAGNOSIS — N1832 Chronic kidney disease, stage 3b: Secondary | ICD-10-CM | POA: Diagnosis not present

## 2020-11-21 DIAGNOSIS — M5416 Radiculopathy, lumbar region: Secondary | ICD-10-CM | POA: Insufficient documentation

## 2020-11-21 DIAGNOSIS — E1140 Type 2 diabetes mellitus with diabetic neuropathy, unspecified: Secondary | ICD-10-CM

## 2020-11-21 LAB — MICROALBUMIN / CREATININE URINE RATIO
Creatinine,U: 159.8 mg/dL
Microalb Creat Ratio: 1.6 mg/g (ref 0.0–30.0)
Microalb, Ur: 2.6 mg/dL — ABNORMAL HIGH (ref 0.0–1.9)

## 2020-11-21 LAB — BASIC METABOLIC PANEL
BUN: 29 mg/dL — ABNORMAL HIGH (ref 6–23)
CO2: 28 mEq/L (ref 19–32)
Calcium: 10.1 mg/dL (ref 8.4–10.5)
Chloride: 101 mEq/L (ref 96–112)
Creatinine, Ser: 1.18 mg/dL (ref 0.40–1.20)
GFR: 41.36 mL/min — ABNORMAL LOW (ref >60.00)
Glucose, Bld: 111 mg/dL — ABNORMAL HIGH (ref 70–99)
Potassium: 4.4 mEq/L (ref 3.5–5.1)
Sodium: 141 mEq/L (ref 135–145)

## 2020-11-21 LAB — HEMOGLOBIN A1C: Hgb A1c MFr Bld: 6.6 % — ABNORMAL HIGH (ref 4.6–6.5)

## 2020-11-21 MED ORDER — LEVOTHYROXINE SODIUM 50 MCG PO TABS: ORAL_TABLET | ORAL | 3 refills | Status: DC

## 2020-11-21 MED ORDER — SIMVASTATIN 10 MG PO TABS: 10.0000 mg | ORAL_TABLET | Freq: Every day | ORAL | 3 refills | Status: DC
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
## 2020-12-20 ENCOUNTER — Ambulatory Visit (INDEPENDENT_AMBULATORY_CARE_PROVIDER_SITE_OTHER): Payer: Medicare Other

## 2020-12-20 ENCOUNTER — Other Ambulatory Visit: Payer: Self-pay

## 2020-12-20 DIAGNOSIS — Z1231 Encounter for screening mammogram for malignant neoplasm of breast: Secondary | ICD-10-CM

## 2020-12-21 ENCOUNTER — Other Ambulatory Visit: Payer: Self-pay | Admitting: Family Medicine

## 2020-12-21 DIAGNOSIS — M5416 Radiculopathy, lumbar region: Secondary | ICD-10-CM | POA: Diagnosis not present

## 2021-03-07 ENCOUNTER — Ambulatory Visit: Payer: Medicare Other | Admitting: Family Medicine

## 2021-03-12 ENCOUNTER — Ambulatory Visit (INDEPENDENT_AMBULATORY_CARE_PROVIDER_SITE_OTHER): Payer: Medicare Other | Admitting: Family Medicine

## 2021-03-12 ENCOUNTER — Encounter: Payer: Self-pay | Admitting: Family Medicine

## 2021-03-12 ENCOUNTER — Other Ambulatory Visit: Payer: Self-pay

## 2021-03-12 VITALS — BP 115/69 | HR 70 | Temp 97.6°F | Ht 60.25 in | Wt 169.2 lb

## 2021-03-12 DIAGNOSIS — E7800 Pure hypercholesterolemia, unspecified: Secondary | ICD-10-CM | POA: Diagnosis not present

## 2021-03-12 DIAGNOSIS — E1140 Type 2 diabetes mellitus with diabetic neuropathy, unspecified: Secondary | ICD-10-CM

## 2021-03-12 DIAGNOSIS — N1832 Chronic kidney disease, stage 3b: Secondary | ICD-10-CM

## 2021-03-12 DIAGNOSIS — I10 Essential (primary) hypertension: Secondary | ICD-10-CM | POA: Diagnosis not present

## 2021-03-12 LAB — COMPREHENSIVE METABOLIC PANEL
ALT: 11 U/L (ref 0–35)
AST: 17 U/L (ref 0–37)
Albumin: 4.6 g/dL (ref 3.5–5.2)
Alkaline Phosphatase: 21 U/L — ABNORMAL LOW (ref 39–117)
BUN: 27 mg/dL — ABNORMAL HIGH (ref 6–23)
CO2: 31 mEq/L (ref 19–32)
Calcium: 9.5 mg/dL (ref 8.4–10.5)
Chloride: 103 mEq/L (ref 96–112)
Creatinine, Ser: 1.24 mg/dL — ABNORMAL HIGH (ref 0.40–1.20)
GFR: 38.88 mL/min — ABNORMAL LOW (ref >60.00)
Glucose, Bld: 113 mg/dL — ABNORMAL HIGH (ref 70–99)
Potassium: 4.2 mEq/L (ref 3.5–5.1)
Sodium: 141 mEq/L (ref 135–145)
Total Bilirubin: 0.5 mg/dL (ref 0.2–1.2)
Total Protein: 6.8 g/dL (ref 6.0–8.3)

## 2021-03-12 LAB — LIPID PANEL
Cholesterol: 174 mg/dL (ref 0–200)
HDL: 41 mg/dL (ref >39.00)
LDL Cholesterol: 105 mg/dL — ABNORMAL HIGH (ref 0–99)
NonHDL: 133.07
Total CHOL/HDL Ratio: 4
Triglycerides: 141 mg/dL (ref 0.0–149.0)
VLDL: 28.2 mg/dL (ref 0.0–40.0)

## 2021-03-12 LAB — HEMOGLOBIN A1C: Hgb A1c MFr Bld: 6.6 % — ABNORMAL HIGH (ref 4.6–6.5)
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
## 2021-04-16 ENCOUNTER — Other Ambulatory Visit: Payer: Self-pay | Admitting: Family Medicine

## 2021-05-24 DIAGNOSIS — L570 Actinic keratosis: Secondary | ICD-10-CM | POA: Diagnosis not present

## 2021-06-14 ENCOUNTER — Encounter: Payer: Medicare Other | Admitting: Family Medicine

## 2021-07-05 ENCOUNTER — Ambulatory Visit (INDEPENDENT_AMBULATORY_CARE_PROVIDER_SITE_OTHER): Payer: Medicare Other | Admitting: Family Medicine

## 2021-07-05 ENCOUNTER — Encounter: Payer: Self-pay | Admitting: Family Medicine

## 2021-07-05 VITALS — BP 114/76 | HR 86 | Temp 98.1°F | Ht 60.75 in | Wt 172.6 lb

## 2021-07-05 DIAGNOSIS — I10 Essential (primary) hypertension: Secondary | ICD-10-CM | POA: Diagnosis not present

## 2021-07-05 DIAGNOSIS — E7800 Pure hypercholesterolemia, unspecified: Secondary | ICD-10-CM

## 2021-07-05 DIAGNOSIS — D696 Thrombocytopenia, unspecified: Secondary | ICD-10-CM

## 2021-07-05 DIAGNOSIS — E1140 Type 2 diabetes mellitus with diabetic neuropathy, unspecified: Secondary | ICD-10-CM

## 2021-07-05 DIAGNOSIS — E039 Hypothyroidism, unspecified: Secondary | ICD-10-CM | POA: Diagnosis not present

## 2021-07-05 DIAGNOSIS — Z0000 Encounter for general adult medical examination without abnormal findings: Secondary | ICD-10-CM

## 2021-07-05 DIAGNOSIS — N1832 Chronic kidney disease, stage 3b: Secondary | ICD-10-CM

## 2021-07-05 LAB — HEMOGLOBIN A1C: Hgb A1c MFr Bld: 6.6 % — ABNORMAL HIGH (ref 4.6–6.5)

## 2021-07-05 LAB — BASIC METABOLIC PANEL
BUN: 24 mg/dL — ABNORMAL HIGH (ref 6–23)
CO2: 29 mEq/L (ref 19–32)
Calcium: 10 mg/dL (ref 8.4–10.5)
Chloride: 102 mEq/L (ref 96–112)
Creatinine, Ser: 1.25 mg/dL — ABNORMAL HIGH (ref 0.40–1.20)
GFR: 38.42 mL/min — ABNORMAL LOW (ref >60.00)
Glucose, Bld: 114 mg/dL — ABNORMAL HIGH (ref 70–99)
Potassium: 3.9 mEq/L (ref 3.5–5.1)
Sodium: 138 mEq/L (ref 135–145)

## 2021-07-05 LAB — TSH: TSH: 2.67 u[IU]/mL (ref 0.35–5.50)

## 2021-07-05 LAB — CBC
HCT: 38.1 % (ref 36.0–46.0)
Hemoglobin: 12.6 g/dL (ref 12.0–15.0)
MCHC: 33 g/dL (ref 30.0–36.0)
MCV: 88.7 fl (ref 78.0–100.0)
Platelets: 83 10*3/uL — ABNORMAL LOW (ref 150.0–400.0)
RBC: 4.29 Mil/uL (ref 3.87–5.11)
RDW: 13 % (ref 11.5–15.5)
WBC: 11 10*3/uL — ABNORMAL HIGH (ref 4.0–10.5)

## 2021-07-05 MED ORDER — VALSARTAN-HYDROCHLOROTHIAZIDE 160-25 MG PO TABS: 1.0000 | ORAL_TABLET | Freq: Every day | ORAL | 1 refills | Status: DC
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
## 2021-08-07 DIAGNOSIS — L570 Actinic keratosis: Secondary | ICD-10-CM | POA: Diagnosis not present

## 2021-08-07 DIAGNOSIS — Z85828 Personal history of other malignant neoplasm of skin: Secondary | ICD-10-CM | POA: Diagnosis not present

## 2021-08-29 ENCOUNTER — Other Ambulatory Visit: Payer: Self-pay | Admitting: Family Medicine

## 2021-09-26 ENCOUNTER — Other Ambulatory Visit: Payer: Self-pay | Admitting: Family Medicine

## 2021-10-10 ENCOUNTER — Ambulatory Visit (INDEPENDENT_AMBULATORY_CARE_PROVIDER_SITE_OTHER): Payer: Medicare Other

## 2021-10-10 VITALS — BP 139/80 | HR 71 | Temp 98.4°F

## 2021-10-10 DIAGNOSIS — Z0000 Encounter for general adult medical examination without abnormal findings: Secondary | ICD-10-CM

## 2021-10-10 NOTE — Patient Instructions (Signed)
Diane Lucas , Thank you for taking time to come for your Medicare Wellness Visit. I appreciate your ongoing commitment to your health goals. Please review the following plan we discussed and let me know if I can assist you in the future.   These are the goals we discussed:  Goals       Hgb A1C (pt-stated)      Maintain normal level A1C level by controlling sugar/carb intake.       Patient Stated      Maintain health         This is a list of the screening recommended for you and due dates:  Health Maintenance  Topic Date Due   COVID-19 Vaccine (6 - Pfizer risk series) 12/12/2020   Eye exam for diabetics  12/14/2020   Flu Shot  08/14/2021   Complete foot exam   11/21/2021   Hemoglobin A1C  01/04/2022   Tetanus Vaccine  10/23/2025   Pneumonia Vaccine  Completed   DEXA scan (bone density measurement)  Completed   Zoster (Shingles) Vaccine  Completed   HPV Vaccine  Aged Out    Advanced directives: maintain health  Conditions/risks identified: maintain health  Next appointment: Follow up in one year for your annual wellness visit    Preventive Care 65 Years and Older, Female Preventive care refers to lifestyle choices and visits with your health care provider that can promote health and wellness. What does preventive care include? A yearly physical exam. This is also called an annual well check. Dental exams once or twice a year. Routine eye exams. Ask your health care provider how often you should have your eyes checked. Personal lifestyle choices, including: Daily care of your teeth and gums. Regular physical activity. Eating a healthy diet. Avoiding tobacco and drug use. Limiting alcohol use. Practicing safe sex. Taking low-dose aspirin every day. Taking vitamin and mineral supplements as recommended by your health care provider. What happens during an annual well check? The services and screenings done by your health care provider during your annual well check will  depend on your age, overall health, lifestyle risk factors, and family history of disease. Counseling  Your health care provider may ask you questions about your: Alcohol use. Tobacco use. Drug use. Emotional well-being. Home and relationship well-being. Sexual activity. Eating habits. History of falls. Memory and ability to understand (cognition). Work and work Statistician. Reproductive health. Screening  You may have the following tests or measurements: Height, weight, and BMI. Blood pressure. Lipid and cholesterol levels. These may be checked every 5 years, or more frequently if you are over 4 years old. Skin check. Lung cancer screening. You may have this screening every year starting at age 32 if you have a 30-pack-year history of smoking and currently smoke or have quit within the past 15 years. Fecal occult blood test (FOBT) of the stool. You may have this test every year starting at age 62. Flexible sigmoidoscopy or colonoscopy. You may have a sigmoidoscopy every 5 years or a colonoscopy every 10 years starting at age 4. Hepatitis C blood test. Hepatitis B blood test. Sexually transmitted disease (STD) testing. Diabetes screening. This is done by checking your blood sugar (glucose) after you have not eaten for a while (fasting). You may have this done every 1-3 years. Bone density scan. This is done to screen for osteoporosis. You may have this done starting at age 35. Mammogram. This may be done every 1-2 years. Talk to your health care provider  about how often you should have regular mammograms. Talk with your health care provider about your test results, treatment options, and if necessary, the need for more tests. Vaccines  Your health care provider may recommend certain vaccines, such as: Influenza vaccine. This is recommended every year. Tetanus, diphtheria, and acellular pertussis (Tdap, Td) vaccine. You may need a Td booster every 10 years. Zoster vaccine. You may  need this after age 22. Pneumococcal 13-valent conjugate (PCV13) vaccine. One dose is recommended after age 17. Pneumococcal polysaccharide (PPSV23) vaccine. One dose is recommended after age 20. Talk to your health care provider about which screenings and vaccines you need and how often you need them. This information is not intended to replace advice given to you by your health care provider. Make sure you discuss any questions you have with your health care provider. Document Released: 01/27/2015 Document Revised: 09/20/2015 Document Reviewed: 11/01/2014 Elsevier Interactive Patient Education  2017 [place] Prevention in the Home Falls can cause injuries. They can happen to people of all ages. There are many things you can do to make your home safe and to help prevent falls. What can I do on the outside of my home? Regularly fix the edges of walkways and driveways and fix any cracks. Remove anything that might make you trip as you walk through a door, such as a raised step or threshold. Trim any bushes or trees on the path to your home. Use bright outdoor lighting. Clear any walking paths of anything that might make someone trip, such as rocks or tools. Regularly check to see if handrails are loose or broken. Make sure that both sides of any steps have handrails. Any raised decks and porches should have guardrails on the edges. Have any leaves, snow, or ice cleared regularly. Use sand or salt on walking paths during winter. Clean up any spills in your garage right away. This includes oil or grease spills. What can I do in the bathroom? Use night lights. Install grab bars by the toilet and in the tub and shower. Do not use towel bars as grab bars. Use non-skid mats or decals in the tub or shower. If you need to sit down in the shower, use a plastic, non-slip stool. Keep the floor dry. Clean up any water that spills on the floor as soon as it happens. Remove soap buildup in  the tub or shower regularly. Attach bath mats securely with double-sided non-slip rug tape. Do not have throw rugs and other things on the floor that can make you trip. What can I do in the bedroom? Use night lights. Make sure that you have a light by your bed that is easy to reach. Do not use any sheets or blankets that are too big for your bed. They should not hang down onto the floor. Have a firm chair that has side arms. You can use this for support while you get dressed. Do not have throw rugs and other things on the floor that can make you trip. What can I do in the kitchen? Clean up any spills right away. Avoid walking on wet floors. Keep items that you use a lot in easy-to-reach places. If you need to reach something above you, use a strong step stool that has a grab bar. Keep electrical cords out of the way. Do not use floor polish or wax that makes floors slippery. If you must use wax, use non-skid floor wax. Do not have throw rugs and  other things on the floor that can make you trip. What can I do with my stairs? Do not leave any items on the stairs. Make sure that there are handrails on both sides of the stairs and use them. Fix handrails that are broken or loose. Make sure that handrails are as long as the stairways. Check any carpeting to make sure that it is firmly attached to the stairs. Fix any carpet that is loose or worn. Avoid having throw rugs at the top or bottom of the stairs. If you do have throw rugs, attach them to the floor with carpet tape. Make sure that you have a light switch at the top of the stairs and the bottom of the stairs. If you do not have them, ask someone to add them for you. What else can I do to help prevent falls? Wear shoes that: Do not have high heels. Have rubber bottoms. Are comfortable and fit you well. Are closed at the toe. Do not wear sandals. If you use a stepladder: Make sure that it is fully opened. Do not climb a closed  stepladder. Make sure that both sides of the stepladder are locked into place. Ask someone to hold it for you, if possible. Clearly mark and make sure that you can see: Any grab bars or handrails. First and last steps. Where the edge of each step is. Use tools that help you move around (mobility aids) if they are needed. These include: Canes. Walkers. Scooters. Crutches. Turn on the lights when you go into a dark area. Replace any light bulbs as soon as they burn out. Set up your furniture so you have a clear path. Avoid moving your furniture around. If any of your floors are uneven, fix them. If there are any pets around you, be aware of where they are. Review your medicines with your doctor. Some medicines can make you feel dizzy. This can increase your chance of falling. Ask your doctor what other things that you can do to help prevent falls. This information is not intended to replace advice given to you by your health care provider. Make sure you discuss any questions you have with your health care provider. Document Released: 10/27/2008 Document Revised: 06/08/2015 Document Reviewed: 02/04/2014 Elsevier Interactive Patient Education  2017 [employer].
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
## 2021-10-31 ENCOUNTER — Ambulatory Visit: Payer: Medicare Other | Admitting: Family Medicine

## 2021-11-08 ENCOUNTER — Ambulatory Visit (INDEPENDENT_AMBULATORY_CARE_PROVIDER_SITE_OTHER): Payer: Medicare Other | Admitting: Family Medicine

## 2021-11-08 ENCOUNTER — Encounter: Payer: Self-pay | Admitting: Family Medicine

## 2021-11-08 VITALS — BP 125/69 | HR 75 | Temp 97.9°F | Ht 60.75 in | Wt 172.6 lb

## 2021-11-08 DIAGNOSIS — E1140 Type 2 diabetes mellitus with diabetic neuropathy, unspecified: Secondary | ICD-10-CM | POA: Diagnosis not present

## 2021-11-08 DIAGNOSIS — I10 Essential (primary) hypertension: Secondary | ICD-10-CM | POA: Diagnosis not present

## 2021-11-08 DIAGNOSIS — D696 Thrombocytopenia, unspecified: Secondary | ICD-10-CM | POA: Diagnosis not present

## 2021-11-08 DIAGNOSIS — E7800 Pure hypercholesterolemia, unspecified: Secondary | ICD-10-CM

## 2021-11-08 DIAGNOSIS — N1832 Chronic kidney disease, stage 3b: Secondary | ICD-10-CM

## 2021-11-08 DIAGNOSIS — Z23 Encounter for immunization: Secondary | ICD-10-CM | POA: Diagnosis not present

## 2021-11-08 DIAGNOSIS — E039 Hypothyroidism, unspecified: Secondary | ICD-10-CM

## 2021-11-08 LAB — POCT GLYCOSYLATED HEMOGLOBIN (HGB A1C)
HbA1c POC (<> result, manual entry): 6.6 % (ref 4.0–5.6)
HbA1c, POC (controlled diabetic range): 6.6 % (ref 0.0–7.0)
HbA1c, POC (prediabetic range): 6.6 % — AB (ref 5.7–6.4)
Hemoglobin A1C: 6.6 % — AB (ref 4.0–5.6)

## 2021-11-08 LAB — BASIC METABOLIC PANEL
BUN: 23 mg/dL (ref 6–23)
CO2: 29 mEq/L (ref 19–32)
Calcium: 9.8 mg/dL (ref 8.4–10.5)
Chloride: 103 mEq/L (ref 96–112)
Creatinine, Ser: 1.25 mg/dL — ABNORMAL HIGH (ref 0.40–1.20)
GFR: 38.33 mL/min — ABNORMAL LOW (ref >60.00)
Glucose, Bld: 114 mg/dL — ABNORMAL HIGH (ref 70–99)
Potassium: 4.6 mEq/L (ref 3.5–5.1)
Sodium: 140 mEq/L (ref 135–145)

## 2021-11-08 LAB — CBC
HCT: 37.8 % (ref 36.0–46.0)
Hemoglobin: 12.4 g/dL (ref 12.0–15.0)
MCHC: 32.8 g/dL (ref 30.0–36.0)
MCV: 88.8 fl (ref 78.0–100.0)
Platelets: 90 10*3/uL — ABNORMAL LOW (ref 150.0–400.0)
RBC: 4.25 Mil/uL (ref 3.87–5.11)
RDW: 12.7 % (ref 11.5–15.5)
WBC: 7.5 10*3/uL (ref 4.0–10.5)

## 2021-11-08 LAB — LIPID PANEL
Cholesterol: 171 mg/dL (ref 0–200)
HDL: 40.1 mg/dL (ref >39.00)
LDL Cholesterol: 93 mg/dL (ref 0–99)
NonHDL: 131.3
Total CHOL/HDL Ratio: 4
Triglycerides: 193 mg/dL — ABNORMAL HIGH (ref 0.0–149.0)
VLDL: 38.6 mg/dL (ref 0.0–40.0)

## 2021-11-08 LAB — MICROALBUMIN / CREATININE URINE RATIO
Creatinine,U: 150.2 mg/dL
Microalb Creat Ratio: 1.3 mg/g (ref 0.0–30.0)
Microalb, Ur: 1.9 mg/dL (ref 0.0–1.9)

## 2021-11-08 MED ORDER — LEVOTHYROXINE SODIUM 50 MCG PO TABS: ORAL_TABLET | ORAL | 1 refills | Status: DC

## 2021-11-08 MED ORDER — VALSARTAN-HYDROCHLOROTHIAZIDE 160-25 MG PO TABS: 1.0000 | ORAL_TABLET | Freq: Every day | ORAL | 1 refills | Status: DC

## 2021-11-08 MED ORDER — OMEPRAZOLE 20 MG PO CPDR: 20.0000 mg | DELAYED_RELEASE_CAPSULE | Freq: Every day | ORAL | 1 refills | Status: DC
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
## 2021-11-09 ENCOUNTER — Telehealth: Payer: Self-pay

## 2021-11-09 NOTE — Telephone Encounter (Signed)
Patient returning call about lab results.  Patient made aware of lab results. Copied and pasted from Dr. Idelle Leech note: "All labs stable. No new recommendations."  No questions or concerns at this time.

## 2021-11-09 NOTE — Telephone Encounter (Signed)
Noted  

## 2021-11-27 ENCOUNTER — Other Ambulatory Visit: Payer: Self-pay | Admitting: Family Medicine

## 2021-11-27 DIAGNOSIS — Z1231 Encounter for screening mammogram for malignant neoplasm of breast: Secondary | ICD-10-CM

## 2021-12-14 ENCOUNTER — Telehealth: Payer: Self-pay | Admitting: *Deleted

## 2021-12-14 NOTE — Patient Outreach (Signed)
  Care Coordination   12/14/2021 Name: AMBERLYNN TEMPESTA MRN: [id number] DOB: [birth year]   Care Coordination Outreach Attempts:  An unsuccessful telephone outreach was attempted today to offer the patient information about available care coordination services as a benefit of their health plan.   Follow Up Plan:  Additional outreach attempts will be made to offer the patient care coordination information and services.   Encounter Outcome:  No Answer   Care Coordination Interventions:  No, not indicated    Raina Mina, RN Care Management Coordinator [place] Office [phone]

## 2021-12-17 ENCOUNTER — Other Ambulatory Visit: Payer: Self-pay | Admitting: Family Medicine

## 2022-01-16 ENCOUNTER — Ambulatory Visit (INDEPENDENT_AMBULATORY_CARE_PROVIDER_SITE_OTHER): Payer: Medicare Other

## 2022-01-16 DIAGNOSIS — Z1231 Encounter for screening mammogram for malignant neoplasm of breast: Secondary | ICD-10-CM | POA: Diagnosis not present

## 2022-01-17 ENCOUNTER — Ambulatory Visit: Payer: Medicare Other

## 2022-01-17 DIAGNOSIS — M2011 Hallux valgus (acquired), right foot: Secondary | ICD-10-CM | POA: Diagnosis not present

## 2022-01-17 DIAGNOSIS — M7741 Metatarsalgia, right foot: Secondary | ICD-10-CM | POA: Diagnosis not present

## 2022-01-17 DIAGNOSIS — M21621 Bunionette of right foot: Secondary | ICD-10-CM | POA: Diagnosis not present

## 2022-01-17 DIAGNOSIS — L851 Acquired keratosis [keratoderma] palmaris et plantaris: Secondary | ICD-10-CM | POA: Diagnosis not present

## 2022-01-17 LAB — HM DIABETES EYE EXAM

## 2022-02-07 DIAGNOSIS — L570 Actinic keratosis: Secondary | ICD-10-CM | POA: Diagnosis not present

## 2022-02-07 DIAGNOSIS — D0439 Carcinoma in situ of skin of other parts of face: Secondary | ICD-10-CM | POA: Diagnosis not present

## 2022-02-07 DIAGNOSIS — Z85828 Personal history of other malignant neoplasm of skin: Secondary | ICD-10-CM | POA: Diagnosis not present

## 2022-02-07 DIAGNOSIS — D485 Neoplasm of uncertain behavior of skin: Secondary | ICD-10-CM | POA: Diagnosis not present

## 2022-02-07 DIAGNOSIS — C4491 Basal cell carcinoma of skin, unspecified: Secondary | ICD-10-CM | POA: Diagnosis not present

## 2022-02-07 DIAGNOSIS — Z129 Encounter for screening for malignant neoplasm, site unspecified: Secondary | ICD-10-CM | POA: Diagnosis not present

## 2022-02-07 DIAGNOSIS — D0339 Melanoma in situ of other parts of face: Secondary | ICD-10-CM | POA: Diagnosis not present

## 2022-02-07 DIAGNOSIS — C439 Malignant melanoma of skin, unspecified: Secondary | ICD-10-CM | POA: Diagnosis not present

## 2022-02-07 DIAGNOSIS — L821 Other seborrheic keratosis: Secondary | ICD-10-CM | POA: Diagnosis not present

## 2022-02-21 DIAGNOSIS — D0339 Melanoma in situ of other parts of face: Secondary | ICD-10-CM | POA: Diagnosis not present

## 2022-02-21 DIAGNOSIS — L989 Disorder of the skin and subcutaneous tissue, unspecified: Secondary | ICD-10-CM | POA: Diagnosis not present

## 2022-02-21 DIAGNOSIS — D0439 Carcinoma in situ of skin of other parts of face: Secondary | ICD-10-CM | POA: Diagnosis not present

## 2022-03-02 ENCOUNTER — Other Ambulatory Visit: Payer: Self-pay | Admitting: Family Medicine

## 2022-03-05 DIAGNOSIS — L239 Allergic contact dermatitis, unspecified cause: Secondary | ICD-10-CM | POA: Diagnosis not present

## 2022-03-05 DIAGNOSIS — Z48817 Encounter for surgical aftercare following surgery on the skin and subcutaneous tissue: Secondary | ICD-10-CM | POA: Diagnosis not present

## 2022-03-05 DIAGNOSIS — Z4801 Encounter for change or removal of surgical wound dressing: Secondary | ICD-10-CM | POA: Diagnosis not present

## 2022-03-05 DIAGNOSIS — L923 Foreign body granuloma of the skin and subcutaneous tissue: Secondary | ICD-10-CM | POA: Diagnosis not present

## 2022-03-19 ENCOUNTER — Other Ambulatory Visit: Payer: Self-pay

## 2022-03-20 ENCOUNTER — Encounter: Payer: Self-pay | Admitting: Family Medicine

## 2022-03-20 ENCOUNTER — Ambulatory Visit (INDEPENDENT_AMBULATORY_CARE_PROVIDER_SITE_OTHER): Payer: Medicare Other | Admitting: Family Medicine

## 2022-03-20 VITALS — BP 117/69 | HR 83 | Temp 97.8°F | Ht 60.75 in | Wt 175.2 lb

## 2022-03-20 DIAGNOSIS — I10 Essential (primary) hypertension: Secondary | ICD-10-CM

## 2022-03-20 DIAGNOSIS — E7800 Pure hypercholesterolemia, unspecified: Secondary | ICD-10-CM

## 2022-03-20 DIAGNOSIS — E1121 Type 2 diabetes mellitus with diabetic nephropathy: Secondary | ICD-10-CM | POA: Diagnosis not present

## 2022-03-20 DIAGNOSIS — N1831 Chronic kidney disease, stage 3a: Secondary | ICD-10-CM | POA: Diagnosis not present

## 2022-03-20 LAB — COMPREHENSIVE METABOLIC PANEL
ALT: 12 U/L (ref 0–35)
AST: 17 U/L (ref 0–37)
Albumin: 4.3 g/dL (ref 3.5–5.2)
Alkaline Phosphatase: 19 U/L — ABNORMAL LOW (ref 39–117)
BUN: 30 mg/dL — ABNORMAL HIGH (ref 6–23)
CO2: 29 mEq/L (ref 19–32)
Calcium: 9.6 mg/dL (ref 8.4–10.5)
Chloride: 101 mEq/L (ref 96–112)
Creatinine, Ser: 1.25 mg/dL — ABNORMAL HIGH (ref 0.40–1.20)
GFR: 38.23 mL/min — ABNORMAL LOW (ref >60.00)
Glucose, Bld: 111 mg/dL — ABNORMAL HIGH (ref 70–99)
Potassium: 4.2 mEq/L (ref 3.5–5.1)
Sodium: 141 mEq/L (ref 135–145)
Total Bilirubin: 0.5 mg/dL (ref 0.2–1.2)
Total Protein: 6.9 g/dL (ref 6.0–8.3)

## 2022-03-20 LAB — LIPID PANEL
Cholesterol: 169 mg/dL (ref 0–200)
HDL: 42.5 mg/dL (ref >39.00)
LDL Cholesterol: 97 mg/dL (ref 0–99)
NonHDL: 126.73
Total CHOL/HDL Ratio: 4
Triglycerides: 151 mg/dL — ABNORMAL HIGH (ref 0.0–149.0)
VLDL: 30.2 mg/dL (ref 0.0–40.0)

## 2022-03-20 LAB — POCT GLYCOSYLATED HEMOGLOBIN (HGB A1C)
HbA1c POC (<> result, manual entry): 6.1 % (ref 4.0–5.6)
HbA1c, POC (controlled diabetic range): 6.1 % (ref 0.0–7.0)
HbA1c, POC (prediabetic range): 6.1 % (ref 5.7–6.4)
Hemoglobin A1C: 6.1 % — AB (ref 4.0–5.6)
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
## 2022-03-21 DIAGNOSIS — M21621 Bunionette of right foot: Secondary | ICD-10-CM | POA: Diagnosis not present

## 2022-03-21 DIAGNOSIS — M7741 Metatarsalgia, right foot: Secondary | ICD-10-CM | POA: Diagnosis not present

## 2022-03-21 DIAGNOSIS — L851 Acquired keratosis [keratoderma] palmaris et plantaris: Secondary | ICD-10-CM | POA: Diagnosis not present

## 2022-03-21 DIAGNOSIS — M2011 Hallux valgus (acquired), right foot: Secondary | ICD-10-CM | POA: Diagnosis not present

## 2022-03-25 ENCOUNTER — Other Ambulatory Visit: Payer: Self-pay | Admitting: Family Medicine

## 2022-05-23 DIAGNOSIS — L820 Inflamed seborrheic keratosis: Secondary | ICD-10-CM | POA: Diagnosis not present

## 2022-05-23 DIAGNOSIS — L821 Other seborrheic keratosis: Secondary | ICD-10-CM | POA: Diagnosis not present

## 2022-05-23 DIAGNOSIS — Z85820 Personal history of malignant melanoma of skin: Secondary | ICD-10-CM | POA: Diagnosis not present

## 2022-05-23 DIAGNOSIS — D485 Neoplasm of uncertain behavior of skin: Secondary | ICD-10-CM | POA: Diagnosis not present

## 2022-05-23 DIAGNOSIS — Z86008 Personal history of in-situ neoplasm of other site: Secondary | ICD-10-CM | POA: Diagnosis not present

## 2022-05-23 DIAGNOSIS — L570 Actinic keratosis: Secondary | ICD-10-CM | POA: Diagnosis not present

## 2022-06-14 ENCOUNTER — Emergency Department (HOSPITAL_COMMUNITY): Payer: Medicare Other

## 2022-06-14 ENCOUNTER — Observation Stay (HOSPITAL_COMMUNITY)
Admission: EM | Admit: 2022-06-14 | Discharge: 2022-06-16 | Disposition: A | Discharge status: Home / Self Care | Payer: Medicare Other | Attending: Emergency Medicine | Admitting: Emergency Medicine

## 2022-06-14 ENCOUNTER — Encounter (HOSPITAL_COMMUNITY): Payer: Self-pay

## 2022-06-14 ENCOUNTER — Other Ambulatory Visit: Payer: Self-pay

## 2022-06-14 DIAGNOSIS — E860 Dehydration: Secondary | ICD-10-CM | POA: Insufficient documentation

## 2022-06-14 DIAGNOSIS — N390 Urinary tract infection, site not specified: Secondary | ICD-10-CM | POA: Diagnosis not present

## 2022-06-14 DIAGNOSIS — E119 Type 2 diabetes mellitus without complications: Secondary | ICD-10-CM

## 2022-06-14 DIAGNOSIS — R42 Dizziness and giddiness: Secondary | ICD-10-CM | POA: Diagnosis not present

## 2022-06-14 DIAGNOSIS — N3000 Acute cystitis without hematuria: Secondary | ICD-10-CM | POA: Diagnosis not present

## 2022-06-14 DIAGNOSIS — Z96611 Presence of right artificial shoulder joint: Secondary | ICD-10-CM | POA: Diagnosis not present

## 2022-06-14 DIAGNOSIS — I161 Hypertensive emergency: Secondary | ICD-10-CM | POA: Diagnosis not present

## 2022-06-14 DIAGNOSIS — I10 Essential (primary) hypertension: Secondary | ICD-10-CM | POA: Diagnosis present

## 2022-06-14 DIAGNOSIS — Z853 Personal history of malignant neoplasm of breast: Secondary | ICD-10-CM | POA: Diagnosis not present

## 2022-06-14 DIAGNOSIS — E1140 Type 2 diabetes mellitus with diabetic neuropathy, unspecified: Secondary | ICD-10-CM | POA: Diagnosis not present

## 2022-06-14 DIAGNOSIS — K219 Gastro-esophageal reflux disease without esophagitis: Secondary | ICD-10-CM | POA: Diagnosis present

## 2022-06-14 DIAGNOSIS — Z743 Need for continuous supervision: Secondary | ICD-10-CM | POA: Diagnosis not present

## 2022-06-14 DIAGNOSIS — E785 Hyperlipidemia, unspecified: Secondary | ICD-10-CM | POA: Diagnosis present

## 2022-06-14 DIAGNOSIS — R531 Weakness: Secondary | ICD-10-CM

## 2022-06-14 DIAGNOSIS — E039 Hypothyroidism, unspecified: Secondary | ICD-10-CM | POA: Diagnosis not present

## 2022-06-14 DIAGNOSIS — N189 Chronic kidney disease, unspecified: Secondary | ICD-10-CM | POA: Insufficient documentation

## 2022-06-14 DIAGNOSIS — E1122 Type 2 diabetes mellitus with diabetic chronic kidney disease: Secondary | ICD-10-CM | POA: Diagnosis not present

## 2022-06-14 DIAGNOSIS — Z79899 Other long term (current) drug therapy: Secondary | ICD-10-CM | POA: Diagnosis not present

## 2022-06-14 DIAGNOSIS — K449 Diaphragmatic hernia without obstruction or gangrene: Secondary | ICD-10-CM | POA: Diagnosis not present

## 2022-06-14 DIAGNOSIS — Z0389 Encounter for observation for other suspected diseases and conditions ruled out: Secondary | ICD-10-CM | POA: Diagnosis not present

## 2022-06-14 DIAGNOSIS — I129 Hypertensive chronic kidney disease with stage 1 through stage 4 chronic kidney disease, or unspecified chronic kidney disease: Secondary | ICD-10-CM | POA: Insufficient documentation

## 2022-06-14 DIAGNOSIS — Z96641 Presence of right artificial hip joint: Secondary | ICD-10-CM | POA: Diagnosis not present

## 2022-06-14 DIAGNOSIS — R404 Transient alteration of awareness: Secondary | ICD-10-CM | POA: Diagnosis not present

## 2022-06-14 DIAGNOSIS — E538 Deficiency of other specified B group vitamins: Secondary | ICD-10-CM | POA: Diagnosis not present

## 2022-06-14 DIAGNOSIS — R6889 Other general symptoms and signs: Secondary | ICD-10-CM | POA: Diagnosis not present

## 2022-06-14 LAB — COMPREHENSIVE METABOLIC PANEL
ALT: 13 U/L (ref 0–44)
AST: 28 U/L (ref 15–41)
Albumin: 4.1 g/dL (ref 3.5–5.0)
Alkaline Phosphatase: 16 U/L — ABNORMAL LOW (ref 38–126)
Anion gap: 13 (ref 5–15)
BUN: 26 mg/dL — ABNORMAL HIGH (ref 8–23)
CO2: 20 mmol/L — ABNORMAL LOW (ref 22–32)
Calcium: 9.2 mg/dL (ref 8.9–10.3)
Chloride: 104 mmol/L (ref 98–111)
Creatinine, Ser: 1.24 mg/dL — ABNORMAL HIGH (ref 0.44–1.00)
GFR, Estimated: 42 mL/min — ABNORMAL LOW (ref >60)
Glucose, Bld: 122 mg/dL — ABNORMAL HIGH (ref 70–99)
Potassium: 4.8 mmol/L (ref 3.5–5.1)
Sodium: 137 mmol/L (ref 135–145)
Total Bilirubin: 1.2 mg/dL (ref 0.3–1.2)
Total Protein: 6.6 g/dL (ref 6.5–8.1)

## 2022-06-14 LAB — CBC
HCT: 38 % (ref 36.0–46.0)
Hemoglobin: 12 g/dL (ref 12.0–15.0)
MCH: 29.1 pg (ref 26.0–34.0)
MCHC: 31.6 g/dL (ref 30.0–36.0)
MCV: 92 fL (ref 80.0–100.0)
Platelets: 77 10*3/uL — ABNORMAL LOW (ref 150–400)
RBC: 4.13 MIL/uL (ref 3.87–5.11)
RDW: 12.6 % (ref 11.5–15.5)
WBC: 7.7 10*3/uL (ref 4.0–10.5)
nRBC: 0 % (ref 0.0–0.2)

## 2022-06-14 LAB — URINALYSIS, ROUTINE W REFLEX MICROSCOPIC
Bilirubin Urine: NEGATIVE
Glucose, UA: NEGATIVE mg/dL
Ketones, ur: NEGATIVE mg/dL
Nitrite: NEGATIVE
Protein, ur: NEGATIVE mg/dL
Specific Gravity, Urine: 1.005 (ref 1.005–1.030)
pH: 5 (ref 5.0–8.0)

## 2022-06-14 LAB — CBG MONITORING, ED: Glucose-Capillary: 104 mg/dL — ABNORMAL HIGH (ref 70–99)

## 2022-06-14 LAB — I-STAT CHEM 8, ED
BUN: 32 mg/dL — ABNORMAL HIGH (ref 8–23)
Calcium, Ion: 1.12 mmol/L — ABNORMAL LOW (ref 1.15–1.40)
Chloride: 108 mmol/L (ref 98–111)
Creatinine, Ser: 1.2 mg/dL — ABNORMAL HIGH (ref 0.44–1.00)
Glucose, Bld: 120 mg/dL — ABNORMAL HIGH (ref 70–99)
HCT: 34 % — ABNORMAL LOW (ref 36.0–46.0)
Hemoglobin: 11.6 g/dL — ABNORMAL LOW (ref 12.0–15.0)
Potassium: 4.7 mmol/L (ref 3.5–5.1)
Sodium: 139 mmol/L (ref 135–145)
TCO2: 23 mmol/L (ref 22–32)

## 2022-06-14 LAB — DIFFERENTIAL
Abs Immature Granulocytes: 0.04 10*3/uL (ref 0.00–0.07)
Basophils Absolute: 0 10*3/uL (ref 0.0–0.1)
Basophils Relative: 1 %
Eosinophils Absolute: 0.1 10*3/uL (ref 0.0–0.5)
Eosinophils Relative: 1 %
Immature Granulocytes: 1 %
Lymphocytes Relative: 19 %
Lymphs Abs: 1.5 10*3/uL (ref 0.7–4.0)
Monocytes Absolute: 0.6 10*3/uL (ref 0.1–1.0)
Monocytes Relative: 7 %
Neutro Abs: 5.5 10*3/uL (ref 1.7–7.7)
Neutrophils Relative %: 71 %

## 2022-06-14 LAB — RAPID URINE DRUG SCREEN, HOSP PERFORMED
Amphetamines: NOT DETECTED
Barbiturates: NOT DETECTED
Benzodiazepines: NOT DETECTED
Cocaine: NOT DETECTED
Opiates: NOT DETECTED
Tetrahydrocannabinol: NOT DETECTED

## 2022-06-14 LAB — APTT: aPTT: 28 seconds (ref 24–36)

## 2022-06-14 LAB — PROTIME-INR
INR: 1.1 (ref 0.8–1.2)
Prothrombin Time: 14.4 seconds (ref 11.4–15.2)

## 2022-06-14 LAB — ETHANOL: Alcohol, Ethyl (B): <10 mg/dL (ref <10)

## 2022-06-14 MED ORDER — SODIUM CHLORIDE 0.9 % IV BOLUS
500.0000 mL | Freq: Once | INTRAVENOUS | Status: AC
  Administered 2022-06-14: 500 mL via INTRAVENOUS

## 2022-06-14 MED ORDER — MECLIZINE HCL 25 MG PO TABS
12.5000 mg | ORAL_TABLET | Freq: Once | ORAL | Status: AC
  Administered 2022-06-14: 12.5 mg via ORAL
  Filled 2022-06-14: qty 1

## 2022-06-14 NOTE — ED Triage Notes (Signed)
Pt BIB [facility] form home d/t her chronic dizziness worsening this morning around 0730 causing her to NOT be able to stand & her pressure was 205/95. EMS reports her manual BP for them was 188/94. 12 L good, 80 bpm, 98% on RA, 22g Lt AC, CBG 117. A/Ox4, dizziness persists while in triage.

## 2022-06-14 NOTE — ED Provider Triage Note (Signed)
Emergency Medicine Provider Triage Evaluation Note  Diane Lucas , a 89 y.o. female with PMHx chronic dizziness, HTN, HLD was evaluated in triage.  Pt complains of increased dizziness and elevated BP since this morning. Patient also complains of weakness and states that she is having increased difficulties walking.   Denies loss of consciousness, recent illness, fever, chest pain, dyspnea, abdominal pain, paresthesias, dysuria, hematuria, urinary frequency  Review of Systems  Positive: Dizziness, HTN Negative:   Physical Exam  BP (!) 202/79 (BP Location: Left Arm)   Pulse 79   Temp 97.8 F (36.6 C) (Oral)   Resp 17   SpO2 100%  Gen:   Awake, no distress   Resp:  Normal effort  MSK:   Moves extremities without difficulty  Neuro:  GCS 15. Speech is goal oriented. No deficits appreciated to CN III-XII; symmetric eyebrow raise, no facial drooping, tongue midline. Patient has equal grip strength bilaterally with 5/5 strength against resistance in all major muscle groups bilaterally. Sensation to light touch intact. Patient moves extremities without ataxia. Normal finger-nose-finger.    Medical Decision Making  Medically screening exam initiated at 3:10 PM.  Appropriate orders placed.  Diane Lucas was informed that the remainder of the evaluation will be completed by another provider, this initial triage assessment does not replace that evaluation, and the importance of remaining in the ED until their evaluation is complete.  Neuro exam unremarkable. Ordering CT head and blood labs. No history of stroke or Afib. Endorses taking BP medications this morning and BP usually 120/70s.    Valrie Hart F, [place] 06/14/22 1524

## 2022-06-14 NOTE — ED Notes (Signed)
Patient ambulated to the bathroom.

## 2022-06-14 NOTE — ED Provider Notes (Signed)
[place] EMERGENCY DEPARTMENT AT [facility] Provider Note   CSN: [id number] Arrival date & time: 06/14/22  1457     History  No chief complaint on file.   Diane Lucas is a 89 y.o. female.  Pt is a 89 yo female with a pmhx significant for htn, hld, gerd, hypothyroidism, breast cancer, ckd, dm, neuropathy, and chronic dizziness.  Pt said she took all of her meds this am.  She suddenly developed worsening dizziness and was unable to stand.  She said her bp was elevated.  Dizziness is better now.       Home Medications Prior to Admission medications   Medication Sig Start Date End Date Taking? Authorizing Provider  Acetaminophen (TYLENOL PO) Take by mouth as needed.    [provider]  BIOTIN PO Take by mouth in the morning.    [provider]  Calcium Carbonate-Vitamin D (CALCIUM-VITAMIN D) 500-200 MG-UNIT per tablet Take 2 tablets by mouth daily.    [provider]  levothyroxine (SYNTHROID) 50 MCG tablet TAKE ONE TABLET BY MOUTH DAILY BEFORE BREAKFAST 11/08/21   McGowen, Maryjean Morn, MD  MELATONIN PO Take 5 mg by mouth at bedtime.    [provider]  nitroGLYCERIN (NITRO-BID) 2 % ointment Apply 0.5 inches topically daily. 01/17/22   [provider]  omeprazole (PRILOSEC) 20 MG capsule TAKE ONE CAPSULE BY MOUTH EVERY DAY 03/04/22   McGowen, Maryjean Morn, MD  simvastatin (ZOCOR) 10 MG tablet Take 1 tablet (10 mg total) by mouth at bedtime. 12/17/21   McGowen, Maryjean Morn, MD  valsartan-hydrochlorothiazide (DIOVAN-HCT) 160-25 MG tablet Take 1 tablet by mouth daily. 11/08/21   McGowen, Maryjean Morn, MD      Allergies    Aspirin, Benazepril, Xarelto [rivaroxaban], Amlodipine, and Minocycline    Review of Systems   Review of Systems  Neurological:  Positive for dizziness.  All other systems reviewed and are negative.   Physical Exam Updated Vital Signs BP (!) 158/51 (BP Location: Left Arm)   Pulse 66   Temp 97.9 F (36.6 C) (Oral)    Resp 18   SpO2 97%  Physical Exam Vitals and nursing note reviewed.  Constitutional:      Appearance: Normal appearance.  HENT:     Head: Normocephalic and atraumatic.     Right Ear: External ear normal.     Left Ear: External ear normal.     Nose: Nose normal.     Mouth/Throat:     Mouth: Mucous membranes are moist.     Pharynx: Oropharynx is clear.  Eyes:     Extraocular Movements: Extraocular movements intact.     Conjunctiva/sclera: Conjunctivae normal.     Pupils: Pupils are equal, round, and reactive to light.  Cardiovascular:     Rate and Rhythm: Normal rate and regular rhythm.     Pulses: Normal pulses.     Heart sounds: Normal heart sounds.  Pulmonary:     Effort: Pulmonary effort is normal.     Breath sounds: Normal breath sounds.  Abdominal:     General: Abdomen is flat. Bowel sounds are normal.     Palpations: Abdomen is soft.  Musculoskeletal:        General: Normal range of motion.     Cervical back: Normal range of motion and neck supple.  Skin:    General: Skin is warm.     Capillary Refill: Capillary refill takes less than 2 seconds.  Neurological:  General: No focal deficit present.     Mental Status: She is alert and oriented to person, place, and time.  Psychiatric:        Mood and Affect: Mood normal.        Behavior: Behavior normal.     ED Results / Procedures / Treatments   Labs (all labs ordered are listed, but only abnormal results are displayed) Labs Reviewed  CBC - Abnormal; Notable for the following components:      Result Value   Platelets 77 (*)    All other components within normal limits  COMPREHENSIVE METABOLIC PANEL - Abnormal; Notable for the following components:   CO2 20 (*)    Glucose, Bld 122 (*)    BUN 26 (*)    Creatinine, Ser 1.24 (*)    Alkaline Phosphatase 16 (*)    GFR, Estimated 42 (*)    All other components within normal limits  URINALYSIS, ROUTINE W REFLEX MICROSCOPIC - Abnormal; Notable for the following  components:   APPearance HAZY (*)    Hgb urine dipstick SMALL (*)    Leukocytes,Ua MODERATE (*)    Bacteria, UA RARE (*)    All other components within normal limits  CBG MONITORING, ED - Abnormal; Notable for the following components:   Glucose-Capillary 104 (*)    All other components within normal limits  I-STAT CHEM 8, ED - Abnormal; Notable for the following components:   BUN 32 (*)    Creatinine, Ser 1.20 (*)    Glucose, Bld 120 (*)    Calcium, Ion 1.12 (*)    Hemoglobin 11.6 (*)    HCT 34.0 (*)    All other components within normal limits  ETHANOL  PROTIME-INR  APTT  DIFFERENTIAL  RAPID URINE DRUG SCREEN, HOSP PERFORMED    EKG EKG Interpretation  Date/Time:  Friday Jun 14 2022 15:12:21 EDT Ventricular Rate:  76 PR Interval:  178 QRS Duration: 134 QT Interval:  422 QTC Calculation: 474 R Axis:   60 Text Interpretation: Normal sinus rhythm Right bundle branch block T wave abnormality, consider inferolateral ischemia Abnormal ECG No significant change since last tracing Confirmed by Melene Plan [phone]) on 06/14/2022 4:16:09 PM  Radiology DG Chest Portable 1 View  Result Date: 06/14/2022 CLINICAL DATA:  Hypertension. EXAM: PORTABLE CHEST 1 VIEW COMPARISON:  11/10/2017 FINDINGS: The heart size is normal. Both lungs are clear. Small to moderate hiatal hernia again noted. Right shoulder prosthesis also noted. IMPRESSION: No active disease. Small to moderate hiatal hernia. Electronically Signed   By: Danae Orleans M.D.   On: 06/14/2022 17:44   CT HEAD WO CONTRAST  Result Date: 06/14/2022 CLINICAL DATA:  Altered mental status, dizziness EXAM: CT HEAD WITHOUT CONTRAST TECHNIQUE: Contiguous axial images were obtained from the base of the skull through the vertex without intravenous contrast. RADIATION DOSE REDUCTION: This exam was performed according to the departmental dose-optimization program which includes automated exposure control, adjustment of the mA and/or kV according to  patient size and/or use of iterative reconstruction technique. COMPARISON:  11/09/2017 FINDINGS: Brain: No acute intracranial findings are seen. There are no signs of bleeding within the cranium. Cortical sulci are prominent. There is no focal edema or mass effect. Vascular: Arterial calcifications are seen. Skull: No acute findings are seen. Sinuses/Orbits: Unremarkable. Other: None. IMPRESSION: No acute intracranial findings are seen in noncontrast CT brain. Atrophy. Electronically Signed   By: Ernie Avena M.D.   On: 06/14/2022 16:28    Procedures Procedures  Medications Ordered in ED Medications  sodium chloride 0.9 % bolus 500 mL (0 mLs Intravenous Stopped 06/14/22 2033)  meclizine (ANTIVERT) tablet 12.5 mg (12.5 mg Oral Given 06/14/22 1917)    ED Course/ Medical Decision Making/ A&P                             Medical Decision Making Amount and/or Complexity of Data Reviewed Radiology: ordered.   This patient presents to the ED for concern of dizziness, this involves an extensive number of treatment options, and is a complaint that carries with it a high risk of complications and morbidity.  The differential diagnosis includes cva, vertigo, orthostasis   Co morbidities that complicate the patient evaluation  htn, hld, gerd, hypothyroidism, breast cancer, ckd, dm, neuropathy, and chronic dizziness   Additional history obtained:  Additional history obtained from epic chart review  Lab Tests:  I Ordered, and personally interpreted labs.  The pertinent results include:  cbc nl, cmp with chronic cr elevation of 1.24, uds neg, inr 1.1, ua neg   Imaging Studies ordered:  I ordered imaging studies including cxr and ct head  I independently visualized and interpreted imaging which showed  CXR: No active disease.    Small to moderate hiatal hernia.  CT head: No acute intracranial findings are seen in noncontrast CT brain.  Atrophy.   I agree with the radiologist  interpretation   Cardiac Monitoring:  The patient was maintained on a cardiac monitor.  I personally viewed and interpreted the cardiac monitored which showed an underlying rhythm of: nsr   Medicines ordered and prescription drug management:  I ordered medication including antivert  for sx  Reevaluation of the patient after these medicines showed that the patient improved I have reviewed the patients home medicines and have made adjustments as needed   Test Considered:  mri  Problem List / ED Course:  Dizziness:  mri pending at shift change.  Pt signed out to Dr. Nicanor Alcon.   Reevaluation:  After the interventions noted above, I reevaluated the patient and found that they have :improved   Social Determinants of Health:  +pcp   Dispostion:  Pending at shift change       Final Clinical Impression(s) / ED Diagnoses Final diagnoses:  None    Rx / DC Orders ED Discharge Orders     None         Jacalyn Lefevre, MD 06/14/22 2349

## 2022-06-15 ENCOUNTER — Encounter (HOSPITAL_COMMUNITY): Payer: Self-pay | Admitting: Internal Medicine

## 2022-06-15 DIAGNOSIS — E039 Hypothyroidism, unspecified: Secondary | ICD-10-CM | POA: Diagnosis not present

## 2022-06-15 DIAGNOSIS — N3000 Acute cystitis without hematuria: Secondary | ICD-10-CM

## 2022-06-15 DIAGNOSIS — E119 Type 2 diabetes mellitus without complications: Secondary | ICD-10-CM | POA: Diagnosis not present

## 2022-06-15 DIAGNOSIS — E860 Dehydration: Secondary | ICD-10-CM

## 2022-06-15 DIAGNOSIS — R531 Weakness: Secondary | ICD-10-CM | POA: Diagnosis not present

## 2022-06-15 DIAGNOSIS — E782 Mixed hyperlipidemia: Secondary | ICD-10-CM | POA: Diagnosis not present

## 2022-06-15 DIAGNOSIS — I10 Essential (primary) hypertension: Secondary | ICD-10-CM

## 2022-06-15 DIAGNOSIS — K219 Gastro-esophageal reflux disease without esophagitis: Secondary | ICD-10-CM | POA: Diagnosis not present

## 2022-06-15 DIAGNOSIS — N390 Urinary tract infection, site not specified: Secondary | ICD-10-CM | POA: Diagnosis present

## 2022-06-15 HISTORY — DX: Type 2 diabetes mellitus without complications: E11.9

## 2022-06-15 LAB — COMPREHENSIVE METABOLIC PANEL
ALT: 17 U/L (ref 0–44)
AST: 20 U/L (ref 15–41)
Albumin: 4.4 g/dL (ref 3.5–5.0)
Alkaline Phosphatase: 19 U/L — ABNORMAL LOW (ref 38–126)
Anion gap: 12 (ref 5–15)
BUN: 21 mg/dL (ref 8–23)
CO2: 22 mmol/L (ref 22–32)
Calcium: 9.6 mg/dL (ref 8.9–10.3)
Chloride: 104 mmol/L (ref 98–111)
Creatinine, Ser: 1.15 mg/dL — ABNORMAL HIGH (ref 0.44–1.00)
GFR, Estimated: 46 mL/min — ABNORMAL LOW (ref >60)
Glucose, Bld: 117 mg/dL — ABNORMAL HIGH (ref 70–99)
Potassium: 3.7 mmol/L (ref 3.5–5.1)
Sodium: 138 mmol/L (ref 135–145)
Total Bilirubin: 0.5 mg/dL (ref 0.3–1.2)
Total Protein: 7.4 g/dL (ref 6.5–8.1)

## 2022-06-15 LAB — CBC WITH DIFFERENTIAL/PLATELET
Abs Immature Granulocytes: 0.03 10*3/uL (ref 0.00–0.07)
Basophils Absolute: 0.1 10*3/uL (ref 0.0–0.1)
Basophils Relative: 1 %
Eosinophils Absolute: 0.1 10*3/uL (ref 0.0–0.5)
Eosinophils Relative: 1 %
HCT: 39.4 % (ref 36.0–46.0)
Hemoglobin: 12.8 g/dL (ref 12.0–15.0)
Immature Granulocytes: 0 %
Lymphocytes Relative: 26 %
Lymphs Abs: 2.3 10*3/uL (ref 0.7–4.0)
MCH: 28.4 pg (ref 26.0–34.0)
MCHC: 32.5 g/dL (ref 30.0–36.0)
MCV: 87.6 fL (ref 80.0–100.0)
Monocytes Absolute: 0.6 10*3/uL (ref 0.1–1.0)
Monocytes Relative: 7 %
Neutro Abs: 6 10*3/uL (ref 1.7–7.7)
Neutrophils Relative %: 65 %
Platelets: 92 10*3/uL — ABNORMAL LOW (ref 150–400)
RBC: 4.5 MIL/uL (ref 3.87–5.11)
RDW: 12.5 % (ref 11.5–15.5)
WBC: 9.2 10*3/uL (ref 4.0–10.5)
nRBC: 0 % (ref 0.0–0.2)

## 2022-06-15 LAB — TSH: TSH: 3.356 u[IU]/mL (ref 0.350–4.500)

## 2022-06-15 LAB — GLUCOSE, CAPILLARY
Glucose-Capillary: 101 mg/dL — ABNORMAL HIGH (ref 70–99)
Glucose-Capillary: 88 mg/dL (ref 70–99)
Glucose-Capillary: 134 mg/dL — ABNORMAL HIGH (ref 70–99)
Glucose-Capillary: 118 mg/dL — ABNORMAL HIGH (ref 70–99)

## 2022-06-15 LAB — VITAMIN B12: Vitamin B-12: 205 pg/mL (ref 180–914)

## 2022-06-15 LAB — MAGNESIUM: Magnesium: 1.5 mg/dL — ABNORMAL LOW (ref 1.7–2.4)

## 2022-06-15 MED ORDER — CYANOCOBALAMIN 1000 MCG/ML IJ SOLN
1000.0000 ug | Freq: Every day | INTRAMUSCULAR | Status: AC
  Administered 2022-06-15 – 2022-06-16 (×2): 1000 ug via INTRAMUSCULAR
  Filled 2022-06-15 (×2): qty 1

## 2022-06-15 MED ORDER — HYDRALAZINE HCL 20 MG/ML IJ SOLN: 10.0000 mg | Freq: Four times a day (QID) | INTRAMUSCULAR | Status: DC | PRN

## 2022-06-15 MED ORDER — ACETAMINOPHEN 325 MG PO TABS: 650.0000 mg | ORAL_TABLET | Freq: Four times a day (QID) | ORAL | Status: DC | PRN

## 2022-06-15 MED ORDER — SODIUM CHLORIDE 0.9 % IV SOLN
1.0000 g | INTRAVENOUS | Status: DC
  Administered 2022-06-15: 1 g via INTRAVENOUS
  Filled 2022-06-15: qty 10

## 2022-06-15 MED ORDER — MELATONIN 3 MG PO TABS: 3.0000 mg | ORAL_TABLET | Freq: Every evening | ORAL | Status: DC | PRN

## 2022-06-15 MED ORDER — PANTOPRAZOLE SODIUM 40 MG PO TBEC
40.0000 mg | DELAYED_RELEASE_TABLET | Freq: Every day | ORAL | Status: DC
  Administered 2022-06-15 – 2022-06-16 (×2): 40 mg via ORAL
  Filled 2022-06-15 (×2): qty 1

## 2022-06-15 MED ORDER — SIMVASTATIN 20 MG PO TABS
10.0000 mg | ORAL_TABLET | Freq: Every day | ORAL | Status: DC
  Administered 2022-06-15: 10 mg via ORAL
  Filled 2022-06-15: qty 1

## 2022-06-15 MED ORDER — INSULIN ASPART 100 UNIT/ML IJ SOLN: 0.0000 [IU] | Freq: Three times a day (TID) | INTRAMUSCULAR | Status: DC

## 2022-06-15 MED ORDER — MAGNESIUM SULFATE 2 GM/50ML IV SOLN
2.0000 g | Freq: Once | INTRAVENOUS | Status: AC
  Administered 2022-06-15: 2 g via INTRAVENOUS
  Filled 2022-06-15: qty 50

## 2022-06-15 MED ORDER — IRBESARTAN 300 MG PO TABS
150.0000 mg | ORAL_TABLET | Freq: Every day | ORAL | Status: DC
  Administered 2022-06-15 – 2022-06-16 (×2): 150 mg via ORAL
  Filled 2022-06-15 (×2): qty 0.5

## 2022-06-15 MED ORDER — ONDANSETRON HCL 4 MG/2ML IJ SOLN: 4.0000 mg | Freq: Four times a day (QID) | INTRAMUSCULAR | Status: DC | PRN

## 2022-06-15 MED ORDER — ACETAMINOPHEN 650 MG RE SUPP: 650.0000 mg | Freq: Four times a day (QID) | RECTAL | Status: DC | PRN

## 2022-06-15 MED ORDER — VITAMIN B-12 1000 MCG PO TABS: 1000.0000 ug | ORAL_TABLET | Freq: Every day | ORAL | Status: DC

## 2022-06-15 MED ORDER — LEVOTHYROXINE SODIUM 25 MCG PO TABS
50.0000 ug | ORAL_TABLET | Freq: Every day | ORAL | Status: DC
  Administered 2022-06-15 – 2022-06-16 (×2): 50 ug via ORAL
  Filled 2022-06-15 (×2): qty 2

## 2022-06-15 MED ORDER — SODIUM CHLORIDE 0.9 % IV SOLN
1.0000 g | Freq: Once | INTRAVENOUS | Status: AC
  Administered 2022-06-15: 1 g via INTRAVENOUS
  Filled 2022-06-15: qty 10

## 2022-06-15 MED ORDER — HYDRALAZINE HCL 50 MG PO TABS: 50.0000 mg | ORAL_TABLET | Freq: Four times a day (QID) | ORAL | Status: DC | PRN

## 2022-06-15 MED ORDER — LACTATED RINGERS IV SOLN: INTRAVENOUS | Status: AC
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
## 2022-06-15 NOTE — Evaluation (Signed)
Physical Therapy Evaluation Patient Details Name: Diane Lucas MRN: [id number] DOB: 10-18-1930 Today's Date: 06/15/2022  History of Present Illness  Diane Lucas is a 89 y.o. female who presents with dizziness. UA suggestive of UTI. PMHx: type 2 diabetes mellitus, breast cancer, neuropathy, essential pretension, hyperlipidemia, acquired hypothyroidism and chronic dizziness.  Clinical Impression  Pt admitted with above diagnosis. Pt was able to ambulate with RW with min guard assist with challenges and no LOB.  Could not elicit dizziness with challenges and pt states she hasn't been dizzy today.  Vestibular assessment did not elicit symptoms. Pt progressing and will follow acutely.  Pt currently with functional limitations due to the deficits listed below (see PT Problem List). Pt will benefit from acute skilled PT to increase their independence and safety with mobility to allow discharge.          Recommendations for follow up therapy are one component of a multi-disciplinary discharge planning process, led by the attending physician.  Recommendations may be updated based on patient status, additional functional criteria and insurance authorization.  Follow Up Recommendations       Assistance Recommended at Discharge Set up Supervision/Assistance  Patient can return home with the following  Assistance with cooking/housework;Assist for transportation;Help with stairs or ramp for entrance    Equipment Recommendations None recommended by PT  Recommendations for Other Services       Functional Status Assessment Patient has had a recent decline in their functional status and demonstrates the ability to make significant improvements in function in a reasonable and predictable amount of time.     Precautions / Restrictions Precautions Precautions: Fall Restrictions Weight Bearing Restrictions: No      Mobility  Bed Mobility Overal bed mobility: Needs Assistance Bed Mobility: Supine to  Sit     Supine to sit: Supervision, HOB elevated     General bed mobility comments: HOB elevated, use of bed rail    Transfers Overall transfer level: Needs assistance Equipment used: Rolling walker (2 wheels) Transfers: Sit to/from Stand Sit to Stand: Min guard           General transfer comment: sit to stand transfer from EOB with min guard A and use of RW.    Ambulation/Gait Ambulation/Gait assistance: Min guard Gait Distance (Feet): 200 Feet Assistive device: Rolling walker (2 wheels) Gait Pattern/deviations: Step-through pattern, Decreased stride length   Gait velocity interpretation: <1.31 ft/sec, indicative of household ambulator   General Gait Details: Pt was able to ambulate with RW wtih min guard assist and no LOB. Can withstand min challenges to balance and head turns.  Stairs            Wheelchair Mobility    Modified Rankin (Stroke Patients Only)       Balance Overall balance assessment: Needs assistance Sitting-balance support: Feet supported, No upper extremity supported Sitting balance-Leahy Scale: Fair Sitting balance - Comments: sitting EOB   Standing balance support: Bilateral upper extremity supported, Reliant on assistive device for balance Standing balance-Leahy Scale: Fair Standing balance comment: BUE support required for mobility                             Pertinent Vitals/Pain Pain Assessment Pain Assessment: No/denies pain Faces Pain Scale: No hurt    Home Living Family/patient expects to be discharged to:: Private residence Living Arrangements: Alone Available Help at Discharge: Other (Comment) (none) Type of Home: House Home Access: Ramped entrance  Home Layout: One level Home Equipment: Agricultural consultant (2 wheels);Cane - single point;Shower seat Additional Comments: Family member with recent COVID diagnosis, unable to assist pt at this time    Prior Function Prior Level of Function :  Independent/Modified Independent;Driving (driving short distances)             Mobility Comments: ambulates with a RW at baseline ADLs Comments: Completes ADLs/IADLs, and drives mod I     Hand Dominance   Dominant Hand: Right    Extremity/Trunk Assessment   Upper Extremity Assessment Upper Extremity Assessment: Defer to OT evaluation    Lower Extremity Assessment Lower Extremity Assessment: Generalized weakness    Cervical / Trunk Assessment Cervical / Trunk Assessment: Kyphotic  Communication   Communication: No difficulties  Cognition Arousal/Alertness: Awake/alert Behavior During Therapy: WFL for tasks assessed/performed Overall Cognitive Status: No family/caregiver present to determine baseline cognitive functioning                                 General Comments: Pt A+O x4, able to follow 2-step commands with increased time, answers questions with increased time, and min cues provided for safety with RW use        General Comments General comments (skin integrity, edema, etc.): VSS on RA, could not elicit any nystagmus or symptoms for vestibular assessment and pt states she hasnt been dizzy since yesterday and never had spinning    Exercises     Assessment/Plan    PT Assessment Patient needs continued PT services  PT Problem List Decreased activity tolerance;Decreased balance;Decreased mobility;Decreased knowledge of use of DME;Decreased safety awareness;Decreased knowledge of precautions       PT Treatment Interventions DME instruction;Gait training;Functional mobility training;Therapeutic activities;Therapeutic exercise;Balance training;Patient/family education    PT Goals (Current goals can be found in the Care Plan section)  Acute Rehab PT Goals Patient Stated Goal: to go home PT Goal Formulation: With patient Time For Goal Achievement: 06/29/22 Potential to Achieve Goals: Good    Frequency Min 3X/week     Co-evaluation                AM-PAC PT "6 Clicks" Mobility  Outcome Measure Help needed turning from your back to your side while in a flat bed without using bedrails?: None Help needed moving from lying on your back to sitting on the side of a flat bed without using bedrails?: A Little Help needed moving to and from a bed to a chair (including a wheelchair)?: A Little Help needed standing up from a chair using your arms (e.g., wheelchair or bedside chair)?: A Little Help needed to walk in hospital room?: A Little Help needed climbing 3-5 steps with a railing? : A Little 6 Click Score: 19    End of Session Equipment Utilized During Treatment: Gait belt Activity Tolerance: Patient tolerated treatment well Patient left: in chair;with call bell/phone within reach Nurse Communication: Mobility status PT Visit Diagnosis: Muscle weakness (generalized) (M62.81)    Time: 4098-1191 PT Time Calculation (min) (ACUTE ONLY): 17 min   Charges:   PT Evaluation $PT Eval Low Complexity: 1 Low          Corrado Hymon M,PT Acute Rehab Services [phone]   Bevelyn Buckles 06/15/2022, 3:43 PM

## 2022-06-15 NOTE — Plan of Care (Addendum)
Patient was seen and examined at bedside, currently patient is feeling mild dizziness, much improvement.  Denies any headache.  No chest pain or palpitation, no shortness of breath. Hypertensive emergency, causing dizziness.  continue current meds.  Monitor BP and titrate medications accordingly. UTI, continue ceftriaxone, follow urine culture. Hypomagnesemia, magnesium repleted.  Monitor and replete as needed. Vitamin B12 deficiency: Started vitamin B12 1000 mcg IM injection daily during hospital stay, followed by oral supplement.  Follow-up PCP to repeat vitamin B12 level after 3 to 6 months. Follow repeat labs tomorrow a.m., orthostatic vital signs and plan for disposition tomorrow a.m. if remains stable.  Follow urine culture

## 2022-06-15 NOTE — Evaluation (Signed)
Occupational Therapy Evaluation Patient Details Name: Diane Lucas MRN: [id number] DOB: 07/20/1930 Today's Date: 06/15/2022   History of Present Illness Diane Lucas is a 89 y.o. female who presents with dizziness. UA suggestive of UTI. PMHx: type 2 diabetes mellitus, breast cancer, neuropathy, essential pretension, hyperlipidemia, acquired hypothyroidism and chronic dizziness.   Clinical Impression   Pt evaluated s/p above admission list. Pt reports modified independence with ADLs, IADLs, driving and functional mobility with RW at baseline. During session, pt limited secondary to dizziness, generalized weakness, and decreased balance. Pt currently performs seated UB ADLs with setup A and LB ADLs with min A. Pt completed functional transfers and mobility with min guard A and use of RW. No nystagmus noted with positional changes this session. Pt would continued to benefit from acute OT services to maximize functional independence and facilitate transfer back to pt's natural home environment. Pt does not require follow up OT upon discharge as she is close to her functional baseline.      Recommendations for follow up therapy are one component of a multi-disciplinary discharge planning process, led by the attending physician.  Recommendations may be updated based on patient status, additional functional criteria and insurance authorization.   Assistance Recommended at Discharge PRN  Patient can return home with the following Assistance with cooking/housework    Functional Status Assessment  Patient has had a recent decline in their functional status and demonstrates the ability to make significant improvements in function in a reasonable and predictable amount of time.  Equipment Recommendations  None recommended by OT    Recommendations for Other Services       Precautions / Restrictions Precautions Precautions: Fall Restrictions Weight Bearing Restrictions: No      Mobility Bed  Mobility Overal bed mobility: Needs Assistance Bed Mobility: Supine to Sit     Supine to sit: Supervision, HOB elevated     General bed mobility comments: HOB elevated, use of bed rail    Transfers Overall transfer level: Needs assistance Equipment used: Rolling walker (2 wheels) Transfers: Sit to/from Stand Sit to Stand: Min guard           General transfer comment: STS transfer from EOB with min guard A and use of RW. Room level mobility with min guard A and RW.      Balance Overall balance assessment: Needs assistance Sitting-balance support: Feet supported, No upper extremity supported Sitting balance-Leahy Scale: Fair Sitting balance - Comments: sitting EOB   Standing balance support: Bilateral upper extremity supported, Reliant on assistive device for balance Standing balance-Leahy Scale: Fair Standing balance comment: able to offload BUEs for grooming at sink, BUE support required for mobility                           ADL either performed or assessed with clinical judgement   ADL Overall ADL's : Needs assistance/impaired Eating/Feeding: Independent;Sitting   Grooming: Oral care;Wash/dry hands;Wash/dry face;Min guard;Standing   Upper Body Bathing: Set up;Sitting   Lower Body Bathing: Sit to/from stand;Minimal assistance   Upper Body Dressing : Set up;Sitting   Lower Body Dressing: Minimal assistance;Sit to/from stand Lower Body Dressing Details (indicate cue type and reason): Pt readjusted B socks at EOB using figure four position with supervision Toilet Transfer: Min guard;Rolling walker (2 wheels) Toilet Transfer Details (indicate cue type and reason): simulated Toileting- Clothing Manipulation and Hygiene: Minimal assistance;Sit to/from stand       Functional mobility during ADLs: Min  guard;Rolling walker (2 wheels) General ADL Comments: Able to offload BUEs from RW in standing during grooming tasks, min cues provided for safe RW use      Vision Baseline Vision/History: 1 Wears glasses Ability to See in Adequate Light: 0 Adequate Patient Visual Report: Diplopia (Pt reports diplopia at baseline) Vision Assessment?: No apparent visual deficits     Perception Perception Perception Tested?: No   Praxis Praxis Praxis tested?: Not tested    Pertinent Vitals/Pain Pain Assessment Pain Assessment: Faces Faces Pain Scale: No hurt Pain Intervention(s): Monitored during session     Hand Dominance Right   Extremity/Trunk Assessment Upper Extremity Assessment Upper Extremity Assessment: Generalized weakness   Lower Extremity Assessment Lower Extremity Assessment: Defer to PT evaluation   Cervical / Trunk Assessment Cervical / Trunk Assessment: Kyphotic   Communication Communication Communication: No difficulties   Cognition Arousal/Alertness: Awake/alert Behavior During Therapy: WFL for tasks assessed/performed Overall Cognitive Status: No family/caregiver present to determine baseline cognitive functioning                                 General Comments: Pt A+O x4, able to follow 2-step commands with increased time, answers questions with increased time, and min cues provided for safety with RW use     General Comments  VSS on RA; no nystagmus noted with positional changes this session.    Exercises     Shoulder Instructions      Home Living Family/patient expects to be discharged to:: Private residence Living Arrangements: Alone Available Help at Discharge: Other (Comment) (none) Type of Home: House Home Access: Ramped entrance     Home Layout: One level     Bathroom Shower/Tub: Producer, television/film/video: Handicapped height Bathroom Accessibility: Yes How Accessible: Accessible via walker Home Equipment: Rolling Walker (2 wheels);Cane - single point;Shower seat   Additional Comments: Family member with recent COVID diagnosis, unable to assist pt at this time       Prior Functioning/Environment Prior Level of Function : Independent/Modified Independent;Driving (driving short distances)             Mobility Comments: ambulates with a RW at baseline ADLs Comments: Completes ADLs/IADLs, and drives mod I        OT Problem List: Decreased strength;Decreased activity tolerance;Impaired balance (sitting and/or standing);Decreased knowledge of use of DME or AE;Impaired vision/perception      OT Treatment/Interventions: Self-care/ADL training;Energy conservation;DME and/or AE instruction;Therapeutic activities;Visual/perceptual remediation/compensation;Cognitive remediation/compensation;Patient/family education;Balance training    OT Goals(Current goals can be found in the care plan section) Acute Rehab OT Goals Patient Stated Goal: to go home OT Goal Formulation: With patient Time For Goal Achievement: 06/29/22 Potential to Achieve Goals: Good ADL Goals Pt Will Perform Grooming: standing;with modified independence Pt Will Perform Lower Body Dressing: with modified independence;sit to/from stand Pt Will Transfer to Toilet: with modified independence;ambulating;regular height toilet  OT Frequency: Min 2X/week    Co-evaluation              AM-PAC OT "6 Clicks" Daily Activity     Outcome Measure Help from another person eating meals?: None Help from another person taking care of personal grooming?: A Little Help from another person toileting, which includes using toliet, bedpan, or urinal?: A Little Help from another person bathing (including washing, rinsing, drying)?: A Little Help from another person to put on and taking off regular upper body clothing?: A Little Help from another  person to put on and taking off regular lower body clothing?: A Little 6 Click Score: 19   End of Session Equipment Utilized During Treatment: Gait belt;Rolling walker (2 wheels) Nurse Communication: Mobility status  Activity Tolerance: Patient tolerated  treatment well Patient left: in chair;with call bell/phone within reach;with nursing/sitter in room  OT Visit Diagnosis: Unsteadiness on feet (R26.81);Other abnormalities of gait and mobility (R26.89);Muscle weakness (generalized) (M62.81);History of falling (Z91.81);Dizziness and giddiness (R42)                Time: 0940-1009 OT Time Calculation (min): 29 min Charges:  OT General Charges $OT Visit: 1 Visit OT Evaluation $OT Eval Moderate Complexity: 1 Mod  Sherley Bounds, OTS Acute Rehabilitation Services Office [phone] Secure Chat Communication Preferred   Sherley Bounds 06/15/2022, 12:53 PM
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
## 2022-06-16 DIAGNOSIS — N3000 Acute cystitis without hematuria: Secondary | ICD-10-CM | POA: Diagnosis not present

## 2022-06-16 LAB — CBC
HCT: 34.1 % — ABNORMAL LOW (ref 36.0–46.0)
Hemoglobin: 11.2 g/dL — ABNORMAL LOW (ref 12.0–15.0)
MCH: 28.9 pg (ref 26.0–34.0)
MCHC: 32.8 g/dL (ref 30.0–36.0)
MCV: 87.9 fL (ref 80.0–100.0)
Platelets: 82 10*3/uL — ABNORMAL LOW (ref 150–400)
RBC: 3.88 MIL/uL (ref 3.87–5.11)
RDW: 12.7 % (ref 11.5–15.5)
WBC: 9 10*3/uL (ref 4.0–10.5)
nRBC: 0 % (ref 0.0–0.2)

## 2022-06-16 LAB — BASIC METABOLIC PANEL
Anion gap: 10 (ref 5–15)
BUN: 25 mg/dL — ABNORMAL HIGH (ref 8–23)
CO2: 23 mmol/L (ref 22–32)
Calcium: 9.2 mg/dL (ref 8.9–10.3)
Chloride: 107 mmol/L (ref 98–111)
Creatinine, Ser: 1.35 mg/dL — ABNORMAL HIGH (ref 0.44–1.00)
GFR, Estimated: 38 mL/min — ABNORMAL LOW (ref >60)
Glucose, Bld: 120 mg/dL — ABNORMAL HIGH (ref 70–99)
Potassium: 3.9 mmol/L (ref 3.5–5.1)
Sodium: 140 mmol/L (ref 135–145)

## 2022-06-16 LAB — GLUCOSE, CAPILLARY
Glucose-Capillary: 118 mg/dL — ABNORMAL HIGH (ref 70–99)
Glucose-Capillary: 116 mg/dL — ABNORMAL HIGH (ref 70–99)

## 2022-06-16 LAB — CULTURE, OB URINE: Culture: >=100000 — AB

## 2022-06-16 LAB — MAGNESIUM: Magnesium: 2 mg/dL (ref 1.7–2.4)

## 2022-06-16 LAB — PHOSPHORUS: Phosphorus: 4.4 mg/dL (ref 2.5–4.6)

## 2022-06-16 MED ORDER — AMOXICILLIN-POT CLAVULANATE 500-125 MG PO TABS: 1.0000 | ORAL_TABLET | Freq: Two times a day (BID) | ORAL | 0 refills | Status: DC

## 2022-06-16 MED ORDER — CYANOCOBALAMIN 1000 MCG PO TABS: 1000.0000 ug | ORAL_TABLET | Freq: Every day | ORAL | 0 refills | Status: AC

## 2022-06-16 NOTE — TOC Transition Note (Signed)
Transition of Care [facility]) - CM/SW Discharge Note   Patient Details  Name: Diane Lucas MRN: [id number] Date of Birth: 04-Sep-1930  [facility] CM/SW Contact:  Lawerance Sabal, RN Phone Number: 06/16/2022, 2:36 PM   Clinical Narrative:     [name] w patient over the phone, declined [facility] services.         Patient Goals and CMS Choice      Discharge Placement                         Discharge Plan and Services Additional resources added to the After Visit Summary for                                       Social Determinants of Health (SDOH) Interventions SDOH Screenings   Food Insecurity: No Food Insecurity (06/15/2022)  Housing: Low Risk  (06/15/2022)  Transportation Needs: No Transportation Needs (10/10/2021)  Utilities: Not At Risk (06/15/2022)  Alcohol Screen: Low Risk  (10/07/2020)  Depression (PHQ2-9): Low Risk  (11/08/2021)  Financial Resource Strain: Low Risk  (10/10/2021)  Physical Activity: Insufficiently Active (10/10/2021)  Social Connections: Moderately Integrated (10/10/2021)  Stress: No Stress Concern Present (10/10/2021)  Tobacco Use: Low Risk  (06/15/2022)     Readmission Risk Interventions     No data to display

## 2022-06-16 NOTE — Discharge Summary (Signed)
[facility] Discharge Summary   Patient: Diane Lucas NWG:[id number]  PCP: Jeoffrey Massed, MD  Date of admission: 06/14/2022   Date of discharge: 06/16/2022     Discharge Diagnoses:  Principal Problem:   UTI (urinary tract infection) Active Problems:   Essential hypertension   HLD (hyperlipidemia)   Acquired hypothyroidism   Generalized weakness   Dehydration   DM2 (diabetes mellitus, type 2) (HCC)   GERD (gastroesophageal reflux disease)   Admitted From: Home Disposition:  Home   Recommendations for Outpatient Follow-up:  Follow-up with PCP in 1 week, monitor BP at home and follow with PCP to titrate medication accordingly.  repeat BMP in 1 wk to check renal functions.  Repeat B12 level after 3 months, keep B12 level above 400.  Patient was c/o urinary frequency, urine culture is still pending, empirically started ceftriaxone and switched to Augmentin on discharge.  Follow urine culture Follow up LABS/TEST:  BMP, Urine Culture    Follow-up Information     McGowen, Maryjean Morn, MD Follow up in 1 week(s).   Specialty: Family Medicine Contact information: [address] [place] [address] [phone]                Diet recommendation: Cardiac diet  Activity: The patient is advised to gradually reintroduce usual activities, as tolerated  Discharge Condition: stable  Code Status: Full code   History of present illness: As per the H and P dictated on admission Hospital Course:  Diane Lucas is a 89 y.o. female with medical history significant for type 2 diabetes mellitus, essential pretension, hyperlipidemia, acquired hypothyroidism, who is admitted to [facility] on 06/14/2022 with acute cystitis with after presenting from home to [facility] ED complaining of dizziness, associated with generalized weakness.  Patient denies any focal deficits or numbness.  Patient was also complaining of urinary frequency, no any other complaints.  Patient has peripheral  neuropathy. ED workup reviewed.  BP was elevated, creatinine 1.15, CKD stage IIIa, hypomagnesemia, thrombocytopenia platelet 92 K UA shows moderate LE, rare bacteria, WBC 21-50 CT Head: No acute intracranial findings are seen in noncontrast CT brain. Atrophy. MRI brain: No acute intracranial process. No evidence of acute or subacute infarct. CXR: No active disease. Small to moderate hiatal hernia. TRH consulted for admission and further management as below.  Assessment and plan # Hypertensive emergency, causing dizziness.  Resume irbesartan 150 mg p.o. daily during hospital stay, blood pressure remained stable.  Resumed Diovan-HCTZ.  Patient was advised to monitor BP at home and follow with PCP to titrate medication accordingly. # UTI, s/p ceftriaxone gram IV daily, urine culture growing GNR, complete report is still pending.  Empirically discharged on Augmentin 500 mg p.o. twice daily for 5 days.  Recommend to follow with PCP for complete urine culture report. # Hypomagnesemia, magnesium repleted.  Resolved  # Vitamin B12 deficiency: Started vitamin B12 1000 mcg IM injection daily during hospital stay, followed by oral supplement.  Follow-up PCP to repeat vitamin B12 level after 3 to 6 months. # Hypothyroid, resume Synthroid 50 mcg home dose  Body mass index is 33.94 kg/m.  Nutrition Interventions:   Patient was ambulatory without any assistance. On the day of the discharge the patient's vitals were stable, and no other acute medical condition were reported by patient. the patient was felt safe to be discharge at Home.  Consultants: None Procedures: None  Discharge Exam: General: Appear in no distress, no Rash; Oral Mucosa Clear, moist.  Cardiovascular: S1 and S2 Present, no Murmur, Respiratory: normal respiratory effort, Bilateral Air entry present and no Crackles, no wheezes Abdomen: Bowel Sound present, Soft and no tenderness, no hernia Extremities: no Pedal edema, no calf  tenderness Neurology: alert and oriented to time, place, and person affect appropriate.  Filed Weights   06/15/22 0500 06/16/22 0500  Weight: 80.5 kg 80.8 kg   Vitals:   06/16/22 0435 06/16/22 0721  BP: (!) 140/61 125/64  Pulse: 70 71  Resp: 17 16  Temp: (!) 97.4 F (36.3 C) 98.4 F (36.9 C)  SpO2: 99% 97%    DISCHARGE MEDICATION: Allergies as of 06/16/2022       Reactions   Aspirin Other (See Comments)   REACTION: causes bleeding   Benazepril Swelling   TONGUE   Xarelto [rivaroxaban] Other (See Comments)   EPISTAXIS   Amlodipine Swelling   SWELLING REACTION UNSPECIFIED    Minocycline Swelling   SWELLING REACTION UNSPECIFIED         Medication List     TAKE these medications    amoxicillin-clavulanate 500-125 MG tablet Commonly known as: Augmentin Take 1 tablet by mouth 2 (two) times daily for 5 days.   Biotin 5000 MCG Caps Take 5,000 mcg by mouth in the morning.   calcium-vitamin D 500-200 MG-UNIT tablet Take 2 tablets by mouth daily.   cyanocobalamin 1000 MCG tablet Take 1 tablet (1,000 mcg total) by mouth daily. Start taking on: June 17, 2022   levothyroxine 50 MCG tablet Commonly known as: SYNTHROID TAKE ONE TABLET BY MOUTH DAILY BEFORE BREAKFAST What changed:  how much to take how to take this when to take this additional instructions   MELATONIN PO Take 5 mg by mouth at bedtime.   Nitro-Bid 2 % ointment Generic drug: nitroGLYCERIN Apply 0.5 inches topically daily.   omeprazole 20 MG capsule Commonly known as: PRILOSEC TAKE ONE CAPSULE BY MOUTH EVERY DAY   simvastatin 10 MG tablet Commonly known as: ZOCOR Take 1 tablet (10 mg total) by mouth at bedtime.   TYLENOL PO Take 500 mg by mouth as needed (pain).   valsartan-hydrochlorothiazide 160-25 MG tablet Commonly known as: DIOVAN-HCT Take 1 tablet by mouth daily.       Allergies  Allergen Reactions   Aspirin Other (See Comments)    REACTION: causes bleeding   Benazepril  Swelling    TONGUE   Xarelto [Rivaroxaban] Other (See Comments)    EPISTAXIS   Amlodipine Swelling    SWELLING REACTION UNSPECIFIED    Minocycline Swelling    SWELLING REACTION UNSPECIFIED    Discharge Instructions     Call MD for:  difficulty breathing, headache or visual disturbances   Complete by: As directed    Call MD for:  extreme fatigue   Complete by: As directed    Call MD for:  persistant dizziness or light-headedness   Complete by: As directed    Call MD for:  persistant nausea and vomiting   Complete by: As directed    Call MD for:  severe uncontrolled pain   Complete by: As directed    Call MD for:  temperature >100.4   Complete by: As directed    Diet - low sodium heart healthy   Complete by: As directed    Discharge instructions   Complete by: As directed    Follow-up with PCP in 1 week, monitor BP at home and follow with PCP to titrate medication accordingly.  repeat BMP in 1 wk to check  renal functions.  Repeat B12 level after 3 months, keep B12 level above 400.  Patient was c/o urinary frequency, urine culture is still pending, empirically started ceftriaxone and switched to Augmentin on discharge.  Follow urine culture   Increase activity slowly   Complete by: As directed        The results of significant diagnostics from this hospitalization (including imaging, microbiology, ancillary and laboratory) are listed below for reference.    Significant Diagnostic Studies: MR BRAIN WO CONTRAST  Result Date: 06/15/2022 CLINICAL DATA:  Stroke suspected EXAM: MRI HEAD WITHOUT CONTRAST TECHNIQUE: Multiplanar, multiecho pulse sequences of the brain and surrounding structures were obtained without intravenous contrast. COMPARISON:  06/18/2006 MRI head, 06/14/2022 CT head FINDINGS: Brain: No restricted diffusion to suggest acute or subacute infarct. No acute hemorrhage, mass, mass effect, or midline shift. No hydrocephalus or extra-axial collection. Normal craniocervical  junction. No hemosiderin deposition to suggest remote hemorrhage. Normal cerebral volume for age. Remote lacunar infarcts in the bilateral basal ganglia. T2 hyperintense signal in the periventricular white matter, likely the sequela of mild chronic small vessel ischemic disease. Vascular: Normal arterial flow voids. Skull and upper cervical spine: Normal marrow signal. Sinuses/Orbits: Clear paranasal sinuses. No acute finding in the orbits. Status post bilateral lens replacements. Other: The mastoid air cells are well aerated. IMPRESSION: No acute intracranial process. No evidence of acute or subacute infarct. Electronically Signed   By: Wiliam Ke M.D.   On: 06/15/2022 00:52   DG Chest Portable 1 View  Result Date: 06/14/2022 CLINICAL DATA:  Hypertension. EXAM: PORTABLE CHEST 1 VIEW COMPARISON:  11/10/2017 FINDINGS: The heart size is normal. Both lungs are clear. Small to moderate hiatal hernia again noted. Right shoulder prosthesis also noted. IMPRESSION: No active disease. Small to moderate hiatal hernia. Electronically Signed   By: Danae Orleans M.D.   On: 06/14/2022 17:44   CT HEAD WO CONTRAST  Result Date: 06/14/2022 CLINICAL DATA:  Altered mental status, dizziness EXAM: CT HEAD WITHOUT CONTRAST TECHNIQUE: Contiguous axial images were obtained from the base of the skull through the vertex without intravenous contrast. RADIATION DOSE REDUCTION: This exam was performed according to the departmental dose-optimization program which includes automated exposure control, adjustment of the mA and/or kV according to patient size and/or use of iterative reconstruction technique. COMPARISON:  11/09/2017 FINDINGS: Brain: No acute intracranial findings are seen. There are no signs of bleeding within the cranium. Cortical sulci are prominent. There is no focal edema or mass effect. Vascular: Arterial calcifications are seen. Skull: No acute findings are seen. Sinuses/Orbits: Unremarkable. Other: None. IMPRESSION:  No acute intracranial findings are seen in noncontrast CT brain. Atrophy. Electronically Signed   By: Ernie Avena M.D.   On: 06/14/2022 16:28    Microbiology: No results found for this or any previous visit (from the past 240 hour(s)).   Labs: CBC: Recent Labs  Lab 06/14/22 1535 06/14/22 1543 06/15/22 0324 06/16/22 0419  WBC 7.7  --  9.2 9.0  NEUTROABS 5.5  --  6.0  --   HGB 12.0 11.6* 12.8 11.2*  HCT 38.0 34.0* 39.4 34.1*  MCV 92.0  --  87.6 87.9  PLT 77*  --  92* 82*   Basic Metabolic Panel: Recent Labs  Lab 06/14/22 1535 06/14/22 1543 06/15/22 0324 06/16/22 0419  NA 137 139 138 140  K 4.8 4.7 3.7 3.9  CL 104 108 104 107  CO2 20*  --  22 23  GLUCOSE 122* 120* 117* 120*  BUN 26*  32* 21 25*  CREATININE 1.24* 1.20* 1.15* 1.35*  CALCIUM 9.2  --  9.6 9.2  MG  --   --  1.5* 2.0  PHOS  --   --   --  4.4   Liver Function Tests: Recent Labs  Lab 06/14/22 1535 06/15/22 0324  AST 28 20  ALT 13 17  ALKPHOS 16* 19*  BILITOT 1.2 0.5  PROT 6.6 7.4  ALBUMIN 4.1 4.4   No results for input(s): "LIPASE", "AMYLASE" in the last 168 hours. No results for input(s): "AMMONIA" in the last 168 hours. Cardiac Enzymes: No results for input(s): "CKTOTAL", "CKMB", "CKMBINDEX", "TROPONINI" in the last 168 hours. BNP (last 3 results) No results for input(s): "BNP" in the last 8760 hours. CBG: Recent Labs  Lab 06/15/22 1219 06/15/22 1545 06/15/22 2140 06/16/22 0718 06/16/22 1155  GLUCAP 88 134* 118* 118* 116*    Time spent: 35 minutes  Signed:  Gillis Santa  [facility] 06/16/2022 1:06 PM

## 2022-06-16 NOTE — Plan of Care (Signed)

## 2022-06-17 LAB — CULTURE, OB URINE

## 2022-06-20 ENCOUNTER — Encounter: Payer: Medicare Other | Admitting: Family Medicine

## 2022-06-24 ENCOUNTER — Ambulatory Visit (INDEPENDENT_AMBULATORY_CARE_PROVIDER_SITE_OTHER): Payer: Medicare Other | Admitting: Family Medicine

## 2022-06-24 ENCOUNTER — Encounter: Payer: Self-pay | Admitting: Family Medicine

## 2022-06-24 VITALS — BP 123/73 | HR 79 | Ht 60.75 in | Wt 180.0 lb

## 2022-06-24 DIAGNOSIS — I160 Hypertensive urgency: Secondary | ICD-10-CM | POA: Diagnosis not present

## 2022-06-24 DIAGNOSIS — N3001 Acute cystitis with hematuria: Secondary | ICD-10-CM
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
## 2022-06-25 ENCOUNTER — Other Ambulatory Visit: Payer: Self-pay | Admitting: Family Medicine

## 2022-06-25 NOTE — Telephone Encounter (Signed)
Last refill 12/17/21 for a 6 month supply, upcoming cpe appt 6/26. Will provide temporary refill until then.

## 2022-06-26 LAB — URINE CULTURE
MICRO NUMBER:: 15062647
SPECIMEN QUALITY:: ADEQUATE

## 2022-07-09 NOTE — Patient Instructions (Signed)

## 2022-07-10 ENCOUNTER — Ambulatory Visit (INDEPENDENT_AMBULATORY_CARE_PROVIDER_SITE_OTHER): Payer: Medicare Other | Admitting: Family Medicine

## 2022-07-10 ENCOUNTER — Encounter: Payer: Self-pay | Admitting: Family Medicine

## 2022-07-10 VITALS — BP 140/70 | HR 71 | Temp 98.1°F | Ht 61.0 in | Wt 181.0 lb

## 2022-07-10 DIAGNOSIS — N1831 Chronic kidney disease, stage 3a: Secondary | ICD-10-CM | POA: Diagnosis not present

## 2022-07-10 DIAGNOSIS — E7800 Pure hypercholesterolemia, unspecified: Secondary | ICD-10-CM | POA: Diagnosis not present

## 2022-07-10 DIAGNOSIS — E119 Type 2 diabetes mellitus without complications: Secondary | ICD-10-CM

## 2022-07-10 DIAGNOSIS — D649 Anemia, unspecified: Secondary | ICD-10-CM | POA: Diagnosis not present

## 2022-07-10 DIAGNOSIS — Z0000 Encounter for general adult medical examination without abnormal findings: Secondary | ICD-10-CM | POA: Diagnosis not present

## 2022-07-10 DIAGNOSIS — I10 Essential (primary) hypertension: Secondary | ICD-10-CM

## 2022-07-10 LAB — POCT GLYCOSYLATED HEMOGLOBIN (HGB A1C)
HbA1c POC (<> result, manual entry): 6.2 % (ref 4.0–5.6)
HbA1c, POC (controlled diabetic range): 6.2 % (ref 0.0–7.0)
HbA1c, POC (prediabetic range): 6.2 % (ref 5.7–6.4)
Hemoglobin A1C: 6.2 % — AB (ref 4.0–5.6)

## 2022-07-10 LAB — COMPREHENSIVE METABOLIC PANEL
ALT: 12 U/L (ref 0–35)
AST: 19 U/L (ref 0–37)
Albumin: 4.5 g/dL (ref 3.5–5.2)
Alkaline Phosphatase: 19 U/L — ABNORMAL LOW (ref 39–117)
BUN: 19 mg/dL (ref 6–23)
CO2: 28 mEq/L (ref 19–32)
Calcium: 10.3 mg/dL (ref 8.4–10.5)
Chloride: 103 mEq/L (ref 96–112)
Creatinine, Ser: 1.25 mg/dL — ABNORMAL HIGH (ref 0.40–1.20)
GFR: 38.15 mL/min — ABNORMAL LOW (ref >60.00)
Glucose, Bld: 109 mg/dL — ABNORMAL HIGH (ref 70–99)
Potassium: 4.4 mEq/L (ref 3.5–5.1)
Sodium: 139 mEq/L (ref 135–145)
Total Bilirubin: 0.6 mg/dL (ref 0.2–1.2)
Total Protein: 6.8 g/dL (ref 6.0–8.3)

## 2022-07-10 LAB — CBC
HCT: 37 % (ref 36.0–46.0)
Hemoglobin: 12.2 g/dL (ref 12.0–15.0)
MCHC: 33 g/dL (ref 30.0–36.0)
MCV: 89.7 fl (ref 78.0–100.0)
Platelets: 77 10*3/uL — ABNORMAL LOW (ref 150.0–400.0)
RBC: 4.13 Mil/uL (ref 3.87–5.11)
RDW: 13 % (ref 11.5–15.5)
WBC: 8 10*3/uL (ref 4.0–10.5)

## 2022-07-10 LAB — LIPID PANEL
Cholesterol: 154 mg/dL (ref 0–200)
HDL: 36.7 mg/dL — ABNORMAL LOW (ref >39.00)
LDL Cholesterol: 82 mg/dL (ref 0–99)
NonHDL: 117.52
Total CHOL/HDL Ratio: 4
Triglycerides: 180 mg/dL — ABNORMAL HIGH (ref 0.0–149.0)
VLDL: 36 mg/dL (ref 0.0–40.0)

## 2022-07-10 MED ORDER — SIMVASTATIN 10 MG PO TABS: 10.0000 mg | ORAL_TABLET | Freq: Every day | ORAL | 0 refills | Status: DC

## 2022-07-10 MED ORDER — LEVOTHYROXINE SODIUM 50 MCG PO TABS: ORAL_TABLET | ORAL | 1 refills | Status: DC

## 2022-07-10 MED ORDER — VALSARTAN-HYDROCHLOROTHIAZIDE 160-25 MG PO TABS: 1.0000 | ORAL_TABLET | Freq: Every day | ORAL | 1 refills | Status: DC
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
## 2022-07-25 ENCOUNTER — Telehealth: Payer: Self-pay | Admitting: Family Medicine

## 2022-07-25 MED ORDER — SIMVASTATIN 10 MG PO TABS: 10.0000 mg | ORAL_TABLET | Freq: Every day | ORAL | 1 refills | Status: DC

## 2022-07-25 NOTE — Telephone Encounter (Signed)
Patient called and reports that she only received 1 moth supply of simvastatin (ZOCOR) 10 MG tablet  and she normally receives a 90 day supply. She is asking that a 90 day supply be approved, and her pharmacy is [facility] in [place]

## 2022-07-25 NOTE — Telephone Encounter (Signed)
Pt advised 6 month supply sent to pharmacy.

## 2022-08-15 ENCOUNTER — Telehealth: Payer: Self-pay | Admitting: Family Medicine

## 2022-08-15 NOTE — Telephone Encounter (Signed)
Left vm for pt to return my call.  

## 2022-08-20 DIAGNOSIS — L851 Acquired keratosis [keratoderma] palmaris et plantaris: Secondary | ICD-10-CM | POA: Diagnosis not present

## 2022-08-20 DIAGNOSIS — M7741 Metatarsalgia, right foot: Secondary | ICD-10-CM | POA: Diagnosis not present

## 2022-08-20 DIAGNOSIS — M21621 Bunionette of right foot: Secondary | ICD-10-CM | POA: Diagnosis not present

## 2022-08-20 DIAGNOSIS — M2011 Hallux valgus (acquired), right foot: Secondary | ICD-10-CM | POA: Diagnosis not present

## 2022-09-02 ENCOUNTER — Other Ambulatory Visit: Payer: Self-pay | Admitting: Family Medicine

## 2022-09-24 DIAGNOSIS — L820 Inflamed seborrheic keratosis: Secondary | ICD-10-CM | POA: Diagnosis not present

## 2022-09-24 DIAGNOSIS — L570 Actinic keratosis: Secondary | ICD-10-CM | POA: Diagnosis not present

## 2022-09-24 DIAGNOSIS — Z85820 Personal history of malignant melanoma of skin: Secondary | ICD-10-CM | POA: Diagnosis not present

## 2022-09-24 DIAGNOSIS — Z129 Encounter for screening for malignant neoplasm, site unspecified: Secondary | ICD-10-CM | POA: Diagnosis not present

## 2022-09-24 DIAGNOSIS — D0462 Carcinoma in situ of skin of left upper limb, including shoulder: Secondary | ICD-10-CM | POA: Diagnosis not present

## 2022-09-24 DIAGNOSIS — Z86008 Personal history of in-situ neoplasm of other site: Secondary | ICD-10-CM | POA: Diagnosis not present

## 2022-10-03 ENCOUNTER — Other Ambulatory Visit: Payer: Self-pay

## 2022-10-03 ENCOUNTER — Encounter: Payer: Self-pay | Admitting: Family Medicine

## 2022-10-03 ENCOUNTER — Ambulatory Visit (INDEPENDENT_AMBULATORY_CARE_PROVIDER_SITE_OTHER): Payer: Medicare Other | Admitting: Family Medicine

## 2022-10-03 VITALS — BP 126/75 | HR 73 | Temp 97.3°F | Wt 177.8 lb

## 2022-10-03 DIAGNOSIS — I10 Essential (primary) hypertension: Secondary | ICD-10-CM

## 2022-10-03 DIAGNOSIS — Z23 Encounter for immunization: Secondary | ICD-10-CM | POA: Diagnosis not present

## 2022-10-03 DIAGNOSIS — N2889 Other specified disorders of kidney and ureter: Secondary | ICD-10-CM

## 2022-10-03 DIAGNOSIS — E119 Type 2 diabetes mellitus without complications: Secondary | ICD-10-CM | POA: Diagnosis not present

## 2022-10-03 DIAGNOSIS — E538 Deficiency of other specified B group vitamins: Secondary | ICD-10-CM

## 2022-10-03 DIAGNOSIS — N1831 Chronic kidney disease, stage 3a: Secondary | ICD-10-CM | POA: Diagnosis not present

## 2022-10-03 LAB — POCT GLYCOSYLATED HEMOGLOBIN (HGB A1C)
HbA1c POC (<> result, manual entry): 6.4 % (ref 4.0–5.6)
HbA1c, POC (controlled diabetic range): 6.4 % (ref 0.0–7.0)
HbA1c, POC (prediabetic range): 6.4 % (ref 5.7–6.4)
Hemoglobin A1C: 6.4 % — AB (ref 4.0–5.6)

## 2022-10-03 MED ORDER — VITAMIN B-12 1000 MCG PO TABS: 1000.0000 ug | ORAL_TABLET | Freq: Every day | ORAL | 1 refills | Status: DC
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
## 2022-10-04 LAB — BASIC METABOLIC PANEL
BUN: 27 mg/dL — ABNORMAL HIGH (ref 6–23)
CO2: 26 mEq/L (ref 19–32)
Calcium: 9.7 mg/dL (ref 8.4–10.5)
Chloride: 104 mEq/L (ref 96–112)
Creatinine, Ser: 1.56 mg/dL — ABNORMAL HIGH (ref 0.40–1.20)
GFR: 29.2 mL/min — ABNORMAL LOW (ref >60.00)
Glucose, Bld: 98 mg/dL (ref 70–99)
Potassium: 4.2 mEq/L (ref 3.5–5.1)
Sodium: 141 mEq/L (ref 135–145)

## 2022-10-04 LAB — VITAMIN B12: Vitamin B-12: 780 pg/mL (ref 211–911)

## 2022-10-07 ENCOUNTER — Other Ambulatory Visit: Payer: Self-pay

## 2022-10-07 DIAGNOSIS — N1831 Chronic kidney disease, stage 3a: Secondary | ICD-10-CM

## 2022-10-28 ENCOUNTER — Other Ambulatory Visit: Payer: Self-pay | Admitting: Family Medicine

## 2022-10-29 ENCOUNTER — Other Ambulatory Visit: Payer: Medicare Other

## 2022-10-29 ENCOUNTER — Other Ambulatory Visit (INDEPENDENT_AMBULATORY_CARE_PROVIDER_SITE_OTHER): Payer: Medicare Other

## 2022-10-29 DIAGNOSIS — N1831 Chronic kidney disease, stage 3a: Secondary | ICD-10-CM

## 2022-10-29 LAB — BASIC METABOLIC PANEL
BUN: 24 mg/dL — ABNORMAL HIGH (ref 6–23)
CO2: 25 meq/L (ref 19–32)
Calcium: 9.5 mg/dL (ref 8.4–10.5)
Chloride: 104 meq/L (ref 96–112)
Creatinine, Ser: 1.35 mg/dL — ABNORMAL HIGH (ref 0.40–1.20)
GFR: 34.71 mL/min — ABNORMAL LOW (ref >60.00)
Glucose, Bld: 147 mg/dL — ABNORMAL HIGH (ref 70–99)
Potassium: 3.9 meq/L (ref 3.5–5.1)
Sodium: 139 meq/L (ref 135–145)

## 2022-10-29 NOTE — Progress Notes (Signed)
Pt came for labs only, tolerated draw well.

## 2022-11-06 ENCOUNTER — Telehealth: Payer: Medicare Other | Admitting: Family Medicine

## 2022-11-07 ENCOUNTER — Ambulatory Visit: Payer: Medicare Other | Admitting: Urgent Care

## 2022-11-07 ENCOUNTER — Encounter: Payer: Self-pay | Admitting: Urgent Care

## 2022-11-07 VITALS — BP 131/73 | HR 71 | Temp 97.8°F | Wt 173.0 lb

## 2022-11-07 DIAGNOSIS — R7303 Prediabetes: Secondary | ICD-10-CM | POA: Diagnosis not present

## 2022-11-07 DIAGNOSIS — M10472 Other secondary gout, left ankle and foot: Secondary | ICD-10-CM | POA: Diagnosis not present

## 2022-11-07 DIAGNOSIS — N1832 Chronic kidney disease, stage 3b: Secondary | ICD-10-CM

## 2022-11-07 DIAGNOSIS — M109 Gout, unspecified: Secondary | ICD-10-CM | POA: Insufficient documentation

## 2022-11-07 MED ORDER — PREDNISONE 10 MG PO TABS
10.0000 mg | ORAL_TABLET | Freq: Every day | ORAL | 0 refills | Status: DC
Start: 2022-11-07 — End: 2023-02-27

## 2022-11-07 NOTE — Patient Instructions (Addendum)
Start taking the prednisone as discussed. You will take one tablet daily for the next 5 days it is best to take with breakfast.  Avoid eating foods high in purines (please read attached low purine eating plan handout) Drink more water!!

## 2022-11-07 NOTE — Progress Notes (Signed)
Established Patient Office Visit  Subjective:  Patient ID: Diane Lucas, female    DOB: May 25, 1930  Age: 89 y.o. MRN: [id number]  Chief Complaint  Patient presents with   Foot Swelling    Possible gout flare up in left foot. She stated she has been having pain and swelling since Monday.    89yo female with a history of gout, presents with a recent flare-up. She reports experiencing discomfort in her left foot, specifically around the big toe joint, which began earlier in the week. The pain was severe enough to necessitate the use of a walker for mobility within the home. The patient notes some improvement in symptoms, attributing this to increased water intake and application of ice. Last gout flare 1 year ago.  The patient also has a history of foot problems, particularly with the right foot, and is under the care of a podiatrist. She has been prescribed ammonium lactate lotion for her foot condition, but admits to inconsistent use recently due to personal events and travel.  The patient has a history of kidney issues, with recent blood work showing fluctuations in kidney function. She reports a significant increase in physical activity during a recent vacation, which included more walking than usual. Dietary changes were also noted during this period, with increased consumption of seafood, barbecue, and dumplings.  The patient has a history of prediabetes, with a recent A1c of 6.2%. She reports careful monitoring of her sugar intake and has eliminated brown drinks from her diet. She has previously been prescribed prednisone for gout and back issues, but reports experiencing nervousness as a side effect at high or prolonged doses.  The patient also has a history of neuropathy, with sensation changes in her feet. She reports a decrease in these symptoms recently.     Patient Active Problem List   Diagnosis Date Noted   Gout 11/07/2022   Prediabetes 11/07/2022   UTI (urinary tract  infection) 06/15/2022   Generalized weakness 06/15/2022   Dehydration 06/15/2022   DM2 (diabetes mellitus, type 2) (HCC) 06/15/2022   GERD (gastroesophageal reflux disease) 06/15/2022   Lumbar radiculopathy 11/21/2020   Sacroiliac joint pain 10/01/2019   Thyroid dysfunction 09/15/2018   S/P reverse total shoulder arthroplasty, right 12/18/2017   HLD (hyperlipidemia) 04/25/2015   Stage 3b chronic kidney disease (HCC) 04/25/2015   Thrombocytopenia (HCC) 01/11/2014   Gastroesophageal reflux disease with esophagitis 09/17/2013   Acquired hypothyroidism 09/17/2013   Dyslipidemia 01/21/2013   Obesity (BMI 30-39.9) 11/22/2012   Varicose veins of both legs with edema 11/22/2012   Essential hypertension 11/22/2012   Leg pain, bilateral 11/22/2012   Facial numbness 11/22/2012   Primary cancer of upper outer quadrant of right female breast (HCC) 03/05/2012   Failed total hip arthroplasty (HCC) 07/12/2011   Past Medical History:  Diagnosis Date   Arthritis    Breast cancer (HCC) 02/2012   Right breast lumpectomy + anti-estrogen therapy.  Mammogram 02/2016 suspicious for recurrence as previous lumpectomy site, but bx done 03/14/16.showed fat necrosis with calcifications and no evidence of malignancy.--repeat mammo recommended 1 yr.  Pt to continue tamoxifen until 03/2017.  No further onc f/u as of 08/2016 f/u.   Chronic renal insufficiency, stage III (moderate) (HCC)    CrCl 45 ml/min   Diabetes mellitus (HCC) 2018   DM2 (diabetes mellitus, type 2) (HCC) 06/15/2022   Fracture of humerus neck 11/09/2017   RIGHT Nondisplaced on ER x-rays, but on f/u ortho x-rays 3 wks later x-rays showed severely  impacted and displaced 4 part prox humerus fracture-->then reverse total shoulder arthroplasty was done.12/2017.   GERD (gastroesophageal reflux disease)    Gout    MTP; elev uric acid   History of rheumatic fever age 51   Hyperlipidemia    Hypertension    Labile; renal arterial Dopplers 11/2012 mild to  moderate left renal artery stenosis, does not explain hypertension.   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 Ethelene Hal.  Pt cannot tolerate opioids (nausea).  08/2019 L5-S1 ESI no help.  SI jt inj 09/2019 very helpful.    Thrombocytopenia (HCC)    Likely immune thrombocytopenia   Varicose veins    Vertigo    Social History   Tobacco Use   Smoking status: Never   Smokeless tobacco: Never  Vaping Use   Vaping status: Never Used  Substance Use Topics   Alcohol use: No   Drug use: No      ROS: as noted in HPI  Objective:     BP 131/73   Pulse 71   Temp 97.8 F (36.6 C) (Oral)   Wt 173 lb (78.5 kg)   SpO2 99%   BMI 32.69 kg/m    Physical Exam Vitals and nursing note reviewed.  Constitutional:      General: She is not in acute distress.    Appearance: Normal appearance. She is not ill-appearing, toxic-appearing or diaphoretic.  HENT:     Head: Normocephalic.  Cardiovascular:     Rate and Rhythm: Normal rate.     Pulses: Normal pulses.  Pulmonary:     Effort: Pulmonary effort is normal. No respiratory distress.  Musculoskeletal:        General: Swelling (mild swelling, warmth and erythema noted to L IP joint great toe) and tenderness (mild discomfort to palpation of L IP joint great toe) present. No deformity or signs of injury. Normal range of motion.     Right lower leg: No edema.     Left lower leg: No edema.  Skin:    General: Skin is warm and dry.     Coloration: Skin is not jaundiced.     Findings: No erythema.     Comments: Significant varicosities noted to B feet  Neurological:     General: No focal deficit present.     Mental Status: She is alert and oriented to  person, place, and time.      No results found for any visits on 11/07/22.   The ASCVD Risk score (Arnett DK, et al., 2019) failed to calculate for the following reasons:   The 2019 ASCVD risk score is only valid for ages 69 to 37  Assessment & Plan:  Other secondary acute gout of left foot Assessment & Plan: Gout Flare Acute onset of left big toe joint pain and swelling, likely triggered by dietary factors. Improvement noted with hydration and icing. Unable to use typical gout treatments (indomethacin, colchicine) due to stomach history and kidney function. -Prescribe Prednisone 10mg  daily for 5 days. -Advise to continue hydration, icing, and dietary modifications.  Orders: -     predniSONE; Take 1 tablet (10 mg total) by mouth daily with  breakfast.  Dispense: 5 tablet; Refill: 0  Prediabetes Assessment & Plan: Recent A1C of 6.2%, patient is aware and making dietary modifications. -Continue current management and lifestyle modifications.   Stage 3b chronic kidney disease (HCC) Assessment & Plan: Stable kidney function with recent creatinine of 1.3 and GFR of 34. -Continue current management. -Avoid nephrotoxic medications     Pt has routine follow up in Feb 2025. Current sx mild and improving. Pt will RTC sooner if any new or worsening sx occur.  No follow-ups on file.   Maretta Bees, PA

## 2022-11-07 NOTE — Assessment & Plan Note (Signed)
Gout Flare Acute onset of left big toe joint pain and swelling, likely triggered by dietary factors. Improvement noted with hydration and icing. Unable to use typical gout treatments (indomethacin, colchicine) due to stomach history and kidney function. -Prescribe Prednisone 10mg  daily for 5 days. -Advise to continue hydration, icing, and dietary modifications.

## 2022-11-07 NOTE — Assessment & Plan Note (Signed)
Recent A1C of 6.2%, patient is aware and making dietary modifications. -Continue current management and lifestyle modifications.

## 2022-11-07 NOTE — Assessment & Plan Note (Signed)
Stable kidney function with recent creatinine of 1.3 and GFR of 34. -Continue current management. -Avoid nephrotoxic medications

## 2022-11-21 DIAGNOSIS — M2011 Hallux valgus (acquired), right foot: Secondary | ICD-10-CM | POA: Diagnosis not present

## 2022-11-21 DIAGNOSIS — M7741 Metatarsalgia, right foot: Secondary | ICD-10-CM | POA: Diagnosis not present

## 2022-11-21 DIAGNOSIS — L851 Acquired keratosis [keratoderma] palmaris et plantaris: Secondary | ICD-10-CM | POA: Diagnosis not present

## 2022-11-21 DIAGNOSIS — M21621 Bunionette of right foot: Secondary | ICD-10-CM | POA: Diagnosis not present

## 2022-11-25 ENCOUNTER — Other Ambulatory Visit: Payer: Self-pay | Admitting: Family Medicine

## 2022-12-23 ENCOUNTER — Other Ambulatory Visit: Payer: Self-pay | Admitting: Family Medicine

## 2023-01-22 ENCOUNTER — Other Ambulatory Visit: Payer: Self-pay | Admitting: Family Medicine

## 2023-02-19 ENCOUNTER — Ambulatory Visit (INDEPENDENT_AMBULATORY_CARE_PROVIDER_SITE_OTHER): Payer: Medicare Other | Admitting: Family Medicine

## 2023-02-19 ENCOUNTER — Encounter: Payer: Self-pay | Admitting: Family Medicine

## 2023-02-19 VITALS — BP 135/68 | HR 71 | Temp 97.6°F | Wt 174.6 lb

## 2023-02-19 DIAGNOSIS — I10 Essential (primary) hypertension: Secondary | ICD-10-CM

## 2023-02-19 DIAGNOSIS — M810 Age-related osteoporosis without current pathological fracture: Secondary | ICD-10-CM

## 2023-02-19 DIAGNOSIS — E2839 Other primary ovarian failure: Secondary | ICD-10-CM | POA: Diagnosis not present

## 2023-02-19 DIAGNOSIS — N1831 Chronic kidney disease, stage 3a: Secondary | ICD-10-CM | POA: Diagnosis not present

## 2023-02-19 DIAGNOSIS — M65331 Trigger finger, right middle finger: Secondary | ICD-10-CM | POA: Diagnosis not present

## 2023-02-19 DIAGNOSIS — E1121 Type 2 diabetes mellitus with diabetic nephropathy: Secondary | ICD-10-CM | POA: Diagnosis not present

## 2023-02-19 LAB — POCT GLYCOSYLATED HEMOGLOBIN (HGB A1C)
HbA1c POC (<> result, manual entry): 6.2 % (ref 4.0–5.6)
HbA1c, POC (controlled diabetic range): 6.2 % (ref 0.0–7.0)
HbA1c, POC (prediabetic range): 6.2 % (ref 5.7–6.4)
Hemoglobin A1C: 6.2 % — AB (ref 4.0–5.6)

## 2023-02-19 LAB — BASIC METABOLIC PANEL
BUN: 24 mg/dL — ABNORMAL HIGH (ref 6–23)
CO2: 31 meq/L (ref 19–32)
Calcium: 9.8 mg/dL (ref 8.4–10.5)
Chloride: 101 meq/L (ref 96–112)
Creatinine, Ser: 1.17 mg/dL (ref 0.40–1.20)
GFR: 41.13 mL/min — ABNORMAL LOW (ref >60.00)
Glucose, Bld: 116 mg/dL — ABNORMAL HIGH (ref 70–99)
Potassium: 4.3 meq/L (ref 3.5–5.1)
Sodium: 142 meq/L (ref 135–145)

## 2023-02-19 LAB — MICROALBUMIN / CREATININE URINE RATIO
Creatinine,U: 150 mg/dL
Microalb Creat Ratio: 3.7 mg/g (ref 0.0–30.0)
Microalb, Ur: 5.5 mg/dL — ABNORMAL HIGH (ref 0.0–1.9)

## 2023-02-19 MED ORDER — VALSARTAN-HYDROCHLOROTHIAZIDE 160-25 MG PO TABS: 1.0000 | ORAL_TABLET | Freq: Every day | ORAL | 1 refills | Status: DC

## 2023-02-19 MED ORDER — VITAMIN B-12 1000 MCG PO TABS: 1000.0000 ug | ORAL_TABLET | Freq: Every day | ORAL | 1 refills | Status: DC

## 2023-02-19 MED ORDER — OMEPRAZOLE 20 MG PO CPDR: 20.0000 mg | DELAYED_RELEASE_CAPSULE | Freq: Every day | ORAL | 1 refills | Status: DC

## 2023-02-19 MED ORDER — LEVOTHYROXINE SODIUM 50 MCG PO TABS: ORAL_TABLET | ORAL | 1 refills | Status: DC

## 2023-02-19 MED ORDER — SIMVASTATIN 10 MG PO TABS: 10.0000 mg | ORAL_TABLET | Freq: Every day | ORAL | 1 refills | Status: DC
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
## 2023-02-20 ENCOUNTER — Other Ambulatory Visit: Payer: Self-pay | Admitting: Family Medicine

## 2023-02-20 DIAGNOSIS — Z1231 Encounter for screening mammogram for malignant neoplasm of breast: Secondary | ICD-10-CM

## 2023-02-24 ENCOUNTER — Telehealth: Payer: Self-pay

## 2023-02-24 NOTE — Telephone Encounter (Signed)
 Copied from CRM [id number]. Topic: Clinical - Lab/Test Results >> Feb 21, 2023  1:17 PM Earnestine Goes B wrote: Reason for CRM:  Pt called to follow up on labs please call pt back with lab results at [phone]  Patient notified of results on 2/7. Refer to lab result note

## 2023-02-27 ENCOUNTER — Encounter: Payer: Self-pay | Admitting: Family Medicine

## 2023-02-27 ENCOUNTER — Ambulatory Visit: Payer: Medicare Other | Admitting: Family Medicine

## 2023-02-27 VITALS — BP 143/73 | HR 75 | Ht 61.0 in | Wt 177.2 lb

## 2023-02-27 DIAGNOSIS — M65331 Trigger finger, right middle finger: Secondary | ICD-10-CM | POA: Diagnosis not present

## 2023-02-27 MED ORDER — TRIAMCINOLONE ACETONIDE 40 MG/ML IJ SUSP
20.0000 mg | Freq: Once | INTRAMUSCULAR | Status: AC
Start: 2023-02-27 — End: 2023-02-27
  Administered 2023-02-27: 20 mg via INTRA_ARTICULAR

## 2023-02-27 MED ORDER — TRIAMCINOLONE ACETONIDE 80 MG/ML IJ SUSP: 20.0000 mg | Freq: Once | INTRAMUSCULAR | Status: DC
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
## 2023-03-18 DIAGNOSIS — H532 Diplopia: Secondary | ICD-10-CM | POA: Diagnosis not present

## 2023-03-18 LAB — HM DIABETES EYE EXAM

## 2023-03-19 ENCOUNTER — Ambulatory Visit: Payer: Medicare Other

## 2023-03-19 DIAGNOSIS — Z1231 Encounter for screening mammogram for malignant neoplasm of breast: Secondary | ICD-10-CM

## 2023-04-01 DIAGNOSIS — D485 Neoplasm of uncertain behavior of skin: Secondary | ICD-10-CM | POA: Diagnosis not present

## 2023-04-01 DIAGNOSIS — Z85820 Personal history of malignant melanoma of skin: Secondary | ICD-10-CM | POA: Diagnosis not present

## 2023-04-01 DIAGNOSIS — L570 Actinic keratosis: Secondary | ICD-10-CM | POA: Diagnosis not present

## 2023-04-01 DIAGNOSIS — D0462 Carcinoma in situ of skin of left upper limb, including shoulder: Secondary | ICD-10-CM | POA: Diagnosis not present

## 2023-04-01 DIAGNOSIS — Z129 Encounter for screening for malignant neoplasm, site unspecified: Secondary | ICD-10-CM | POA: Diagnosis not present

## 2023-04-01 DIAGNOSIS — L820 Inflamed seborrheic keratosis: Secondary | ICD-10-CM | POA: Diagnosis not present

## 2023-04-01 DIAGNOSIS — D0471 Carcinoma in situ of skin of right lower limb, including hip: Secondary | ICD-10-CM | POA: Diagnosis not present

## 2023-04-01 DIAGNOSIS — L821 Other seborrheic keratosis: Secondary | ICD-10-CM | POA: Diagnosis not present

## 2023-04-01 DIAGNOSIS — D0461 Carcinoma in situ of skin of right upper limb, including shoulder: Secondary | ICD-10-CM | POA: Diagnosis not present

## 2023-04-16 ENCOUNTER — Ambulatory Visit: Payer: Medicare Other

## 2023-04-17 DIAGNOSIS — D0461 Carcinoma in situ of skin of right upper limb, including shoulder: Secondary | ICD-10-CM | POA: Diagnosis not present

## 2023-04-17 DIAGNOSIS — C44622 Squamous cell carcinoma of skin of right upper limb, including shoulder: Secondary | ICD-10-CM | POA: Diagnosis not present

## 2023-04-17 DIAGNOSIS — D0471 Carcinoma in situ of skin of right lower limb, including hip: Secondary | ICD-10-CM | POA: Diagnosis not present

## 2023-04-17 DIAGNOSIS — L089 Local infection of the skin and subcutaneous tissue, unspecified: Secondary | ICD-10-CM | POA: Diagnosis not present

## 2023-05-01 DIAGNOSIS — D0471 Carcinoma in situ of skin of right lower limb, including hip: Secondary | ICD-10-CM | POA: Diagnosis not present

## 2023-05-07 DIAGNOSIS — D0471 Carcinoma in situ of skin of right lower limb, including hip: Secondary | ICD-10-CM | POA: Diagnosis not present

## 2023-05-19 ENCOUNTER — Other Ambulatory Visit: Payer: Self-pay | Admitting: Family Medicine

## 2023-05-20 DIAGNOSIS — M2011 Hallux valgus (acquired), right foot: Secondary | ICD-10-CM | POA: Diagnosis not present

## 2023-05-20 DIAGNOSIS — M7741 Metatarsalgia, right foot: Secondary | ICD-10-CM | POA: Diagnosis not present

## 2023-05-20 DIAGNOSIS — M21621 Bunionette of right foot: Secondary | ICD-10-CM | POA: Diagnosis not present

## 2023-05-20 DIAGNOSIS — L851 Acquired keratosis [keratoderma] palmaris et plantaris: Secondary | ICD-10-CM | POA: Diagnosis not present

## 2023-06-10 DIAGNOSIS — Z48817 Encounter for surgical aftercare following surgery on the skin and subcutaneous tissue: Secondary | ICD-10-CM | POA: Diagnosis not present

## 2023-06-23 ENCOUNTER — Other Ambulatory Visit: Payer: Self-pay | Admitting: Family Medicine

## 2023-07-10 ENCOUNTER — Emergency Department (HOSPITAL_BASED_OUTPATIENT_CLINIC_OR_DEPARTMENT_OTHER): Admission: EM | Admit: 2023-07-10 | Discharge: 2023-07-10 | Disposition: A | Discharge status: Home / Self Care

## 2023-07-10 ENCOUNTER — Emergency Department (HOSPITAL_BASED_OUTPATIENT_CLINIC_OR_DEPARTMENT_OTHER)

## 2023-07-10 ENCOUNTER — Other Ambulatory Visit: Payer: Self-pay

## 2023-07-10 ENCOUNTER — Ambulatory Visit: Payer: Self-pay

## 2023-07-10 ENCOUNTER — Encounter (HOSPITAL_BASED_OUTPATIENT_CLINIC_OR_DEPARTMENT_OTHER): Payer: Self-pay | Admitting: Emergency Medicine

## 2023-07-10 DIAGNOSIS — E119 Type 2 diabetes mellitus without complications: Secondary | ICD-10-CM | POA: Diagnosis not present

## 2023-07-10 DIAGNOSIS — Z79899 Other long term (current) drug therapy: Secondary | ICD-10-CM | POA: Insufficient documentation

## 2023-07-10 DIAGNOSIS — R202 Paresthesia of skin: Secondary | ICD-10-CM | POA: Insufficient documentation

## 2023-07-10 DIAGNOSIS — R519 Headache, unspecified: Secondary | ICD-10-CM | POA: Insufficient documentation

## 2023-07-10 DIAGNOSIS — I10 Essential (primary) hypertension: Secondary | ICD-10-CM | POA: Insufficient documentation

## 2023-07-10 DIAGNOSIS — I672 Cerebral atherosclerosis: Secondary | ICD-10-CM | POA: Diagnosis not present

## 2023-07-10 DIAGNOSIS — R9082 White matter disease, unspecified: Secondary | ICD-10-CM | POA: Diagnosis not present

## 2023-07-10 LAB — BASIC METABOLIC PANEL WITH GFR
Anion gap: 14 (ref 5–15)
BUN: 20 mg/dL (ref 8–23)
CO2: 25 mmol/L (ref 22–32)
Calcium: 10 mg/dL (ref 8.9–10.3)
Chloride: 101 mmol/L (ref 98–111)
Creatinine, Ser: 1.14 mg/dL — ABNORMAL HIGH (ref 0.44–1.00)
GFR, Estimated: 46 mL/min — ABNORMAL LOW (ref >60)
Glucose, Bld: 98 mg/dL (ref 70–99)
Potassium: 4.1 mmol/L (ref 3.5–5.1)
Sodium: 140 mmol/L (ref 135–145)

## 2023-07-10 LAB — CBC
HCT: 35.5 % — ABNORMAL LOW (ref 36.0–46.0)
Hemoglobin: 11.8 g/dL — ABNORMAL LOW (ref 12.0–15.0)
MCH: 29.1 pg (ref 26.0–34.0)
MCHC: 33.2 g/dL (ref 30.0–36.0)
MCV: 87.4 fL (ref 80.0–100.0)
Platelets: 106 10*3/uL — ABNORMAL LOW (ref 150–400)
RBC: 4.06 MIL/uL (ref 3.87–5.11)
RDW: 12.7 % (ref 11.5–15.5)
WBC: 8.8 10*3/uL (ref 4.0–10.5)
nRBC: 0 % (ref 0.0–0.2)

## 2023-07-10 LAB — TROPONIN T, HIGH SENSITIVITY
Troponin T High Sensitivity: 29 ng/L — ABNORMAL HIGH (ref <19)
Troponin T High Sensitivity: 30 ng/L — ABNORMAL HIGH (ref <19)

## 2023-07-10 NOTE — ED Triage Notes (Signed)
 Reports high BP and headache ,  it don't feel right she said .  Had a fall 4 days ago yet no head injury.  Denies dizziness , numbness to left face today  at 1 pm .  Alert and oriented x 4 , no weakness to all extremities .

## 2023-07-10 NOTE — Telephone Encounter (Signed)
 FYI Only or Action Required?: FYI only for provider.  Patient was last seen in primary care on 02/27/2023 by McGowen, Aleene DEL, MD. Called Nurse Triage reporting Hypertension. Symptoms began yesterday. Interventions attempted: Nothing. Symptoms are: stable.  Triage Disposition: Go to ED Now (Notify PCP) See note, thanks!  Patient/caregiver understands and will follow disposition?: Yes    Copied from CRM [id number]. Topic: Clinical - Red Word Triage >> Jul 10, 2023 12:28 PM [place] A wrote: [facility] that prompted transfer to Nurse Triage: Patient has had a headache all week 190/90 Reason for Disposition  [1] Systolic BP  >= 160 OR Diastolic >= 100 AND [2] cardiac (e.g., breathing difficulty, chest pain) or neurologic symptoms (e.g., new-onset blurred or double vision, unsteady gait)  Answer Assessment - Initial Assessment Questions 1. BLOOD PRESSURE: What is the blood pressure? Did you take at least two measurements 5 minutes apart?  ------197/90  86HR ( prior to call) -------------181/90  HR 82 ( during the call)    2. ONSET: When did you take your blood pressure?     ---- Yesterday    3. HOW: How did you take your blood pressure? (e.g., automatic home BP monitor, visiting nurse)     ----------- Home monitor   4. HISTORY: Do you have a history of high blood pressure?     ---Yes, HTN   5. MEDICINES: Are you taking any medicines for blood pressure? Have you missed any doses recently?      -----------------Diovan -HCT: Denies missing dose.   6. OTHER SYMPTOMS: Do you have any symptoms? (e.g., blurred vision, chest pain, difficulty breathing, headache, weakness)     -------------Denies headache, but notes:  My head is not right. Something is off with my head.    Additional Information: ------Patient also noted she fell last week. Patient unsure if she tripped on something. Denies dizziness prior to falling. On lookers reported she didn't hit her head and did not  lose consciousness.  ----Son is on his way home to take patient to the nearest ED.  Protocols used: Blood Pressure - High-A-AH

## 2023-07-10 NOTE — ED Provider Notes (Signed)
 [place] [facility] AT [facility] Provider Note   CSN: [id number] Arrival date & time: 07/10/23  1404     Patient presents with: Headache   Diane Lucas is a 89 y.o. female.   89 year old female with past medical history of hypertension and diabetes presenting to the emergency department today with an abnormal sensation over the left side of her face.  The patient states that she has been having a warm sensation over the left lower portion of her face now for the past hour or so.  She denies any weakness or numbness.  Denies any difficulty speaking.  She states that she did have a fall last week and does not think that she hit her head.  She states that she has had a funny feeling in her right occipital region since then.  She states that this was going on earlier today and she laid down to take a nap and when she woke up she was having the abnormal sensation of the left side of her face.  She came to the ER for further evaluation regarding this.  Although headaches are listed in her triage note she is denying any headache currently and states that this is an abnormal feeling in her occipital region but does not have any pain.  She states that she checked her blood pressure at home and it was elevated which is another reason why she came to the ER today for further evaluation.   Headache      Prior to Admission medications   Medication Sig Start Date End Date Taking? Authorizing Provider  Acetaminophen  (TYLENOL  PO) Take 500 mg by mouth as needed (pain).    [provider]  Biotin 5000 MCG CAPS Take 5,000 mcg by mouth in the morning.    [provider]  Calcium Carbonate-Vitamin D  (CALCIUM-VITAMIN D ) 500-200 MG-UNIT per tablet Take 2 tablets by mouth daily.    [provider]  Cyanocobalamin  (B-12) 1000 MCG TBCR Take 1 tablet (1,000 mcg total) by mouth daily. 05/19/23   McGowen, Aleene DEL, MD  cyanocobalamin  (VITAMIN B12) 1000 MCG tablet  Take 1 tablet (1,000 mcg total) by mouth daily. 02/19/23   McGowen, Aleene DEL, MD  levothyroxine  (SYNTHROID ) 50 MCG tablet TAKE ONE TABLET BY MOUTH DAILY BEFORE BREAKFAST 02/19/23   McGowen, Aleene DEL, MD  MELATONIN PO Take 5 mg by mouth at bedtime.    [provider]  omeprazole  (PRILOSEC) 20 MG capsule Take 1 capsule (20 mg total) by mouth daily. 02/19/23   McGowen, Aleene DEL, MD  simvastatin  (ZOCOR ) 10 MG tablet Take 1 tablet (10 mg total) by mouth at bedtime. 02/19/23   McGowen, Aleene DEL, MD  valsartan -hydrochlorothiazide  (DIOVAN -HCT) 160-25 MG tablet Take 1 tablet by mouth daily. 02/19/23   McGowen, Aleene DEL, MD    Allergies: Aspirin, Benazepril, Xarelto  [rivaroxaban ], Amlodipine, and Minocycline    Review of Systems  Neurological:  Positive for headaches.  All other systems reviewed and are negative.   Updated Vital Signs BP (!) 199/90   Pulse 70   Temp (!) 97.5 F (36.4 C) (Oral)   Resp 20   Wt 78 kg   SpO2 98%   BMI 32.50 kg/m   Physical Exam Vitals and nursing note reviewed.   Gen: NAD Eyes: PERRL, EOMI HEENT: no oropharyngeal swelling Neck: trachea midline Resp: clear to auscultation bilaterally Card: RRR, no murmurs, rubs, or gallops Abd: nontender, nondistended Extremities: no calf tenderness, no edema Vascular: 2+ radial pulses  bilaterally, 2+ DP pulses bilaterally Neuro: NIH stroke scale of 0 Skin: no rashes Psyc: acting appropriately   (all labs ordered are listed, but only abnormal results are displayed) Labs Reviewed  CBC - Abnormal; Notable for the following components:      Result Value   Hemoglobin 11.8 (*)    HCT 35.5 (*)    Platelets 106 (*)    All other components within normal limits  BASIC METABOLIC PANEL WITH GFR  TROPONIN T, HIGH SENSITIVITY    EKG: EKG Interpretation Date/Time:  Thursday July 10 2023 14:46:40 EDT Ventricular Rate:  70 PR Interval:  180 QRS Duration:  146 QT Interval:  399 QTC Calculation: 431 R Axis:   37  Text  Interpretation: Sinus rhythm Right bundle branch block Confirmed by Ula Barter [phone]) on 07/10/2023 2:53:22 PM  Radiology: No results found.   Procedures   Medications Ordered in the ED - No data to display                                  Medical Decision Making 89 year old female with past medical history of diabetes and hypertension presenting to the emergency department today with an abnormal sensation over the left lower portion of her face.  Objectively the patient does not have any focal neurological deficits and she was reporting this more of an abnormal sensation rather than weakness or numbness.  With her unremarkable exam we will hold off on calling a code stroke at this time given her NIH stroke scale of a 0 here.  I will further evaluate her here with a CT scan of her head to evaluate for intracranial hemorrhage or mass lesion.  Will obtain basic labs here as well as an EKG and troponin to eval for atypical ACS given the left-sided nature of her symptoms.  I will reevaluate for ultimate disposition.  The patient's CT scan and images are pending at time of signout.  Blood pressures improved to 181/58 with no intervention.  Will continue to monitor here.  Amount and/or Complexity of Data Reviewed Labs: ordered. Radiology: ordered.        Final diagnoses:  Facial paresthesia    ED Discharge Orders     None          Ula Barter SAUNDERS, MD 07/10/23 [phone]

## 2023-07-10 NOTE — Telephone Encounter (Signed)
 No further action needed.

## 2023-07-10 NOTE — ED Provider Notes (Signed)
 Patient's blood pressures improved some 172 systolic.  Still elevated but not drastically elevated.  Patient's symptoms improved significantly.  Delta troponins first 1 was 20 9 repeat was 30 no significant change there.  Head CT without any acute findings.  GFR 46 creatinine 1.14.  But not significantly changed from baseline for her.  No significant anemia.  Patient stable for discharge home we will have her trend her blood pressure give her primary care doctor or call tomorrow.  To see if they want to make any adjustments on her blood pressure medicine.  Patient states her pressures were normal yesterday so this may just be a 1 off day.   Gulianna Hornsby, MD 07/10/23 1736

## 2023-07-10 NOTE — Discharge Instructions (Signed)
 Workup here today without any acute findings.  CT head without evidence of any abnormalities.  Blood pressure still somewhat elevated but most recent was 172 systolic normal diastolic.  Give your primary care doctor a call tomorrow would recommend trending your blood pressure daily with a log book.  Definitely return for significant headache significant chest pain difficulty breathing or any strokelike symptoms.  As we discussed the paresthesia on left side of the face most likely not consistent with stroke.  Continue current blood pressure medicine as you have been doing.

## 2023-07-16 ENCOUNTER — Encounter: Payer: Self-pay | Admitting: Family Medicine

## 2023-07-16 ENCOUNTER — Ambulatory Visit: Admitting: Family Medicine

## 2023-07-16 VITALS — BP 131/69 | HR 72 | Temp 98.2°F | Ht 60.0 in | Wt 175.2 lb

## 2023-07-16 DIAGNOSIS — E1121 Type 2 diabetes mellitus with diabetic nephropathy: Secondary | ICD-10-CM

## 2023-07-16 DIAGNOSIS — E7800 Pure hypercholesterolemia, unspecified: Secondary | ICD-10-CM | POA: Diagnosis not present

## 2023-07-16 DIAGNOSIS — Z0000 Encounter for general adult medical examination without abnormal findings: Secondary | ICD-10-CM

## 2023-07-16 DIAGNOSIS — I10 Essential (primary) hypertension: Secondary | ICD-10-CM

## 2023-07-16 DIAGNOSIS — E039 Hypothyroidism, unspecified: Secondary | ICD-10-CM

## 2023-07-16 DIAGNOSIS — N1831 Chronic kidney disease, stage 3a: Secondary | ICD-10-CM

## 2023-07-16 DIAGNOSIS — Z0001 Encounter for general adult medical examination with abnormal findings: Secondary | ICD-10-CM | POA: Diagnosis not present

## 2023-07-16 DIAGNOSIS — W19XXXA Unspecified fall, initial encounter: Secondary | ICD-10-CM

## 2023-07-16 LAB — POCT GLYCOSYLATED HEMOGLOBIN (HGB A1C)
HbA1c POC (<> result, manual entry): 6.5 % (ref 4.0–5.6)
HbA1c, POC (controlled diabetic range): 6.5 % (ref 0.0–7.0)
HbA1c, POC (prediabetic range): 6.5 % — AB (ref 5.7–6.4)
Hemoglobin A1C: 6.5 % — AB (ref 4.0–5.6)

## 2023-07-16 LAB — LIPID PANEL
Cholesterol: 159 mg/dL (ref 0–200)
HDL: 37.7 mg/dL — ABNORMAL LOW (ref >39.00)
LDL Cholesterol: 91 mg/dL (ref 0–99)
NonHDL: 121.77
Total CHOL/HDL Ratio: 4
Triglycerides: 153 mg/dL — ABNORMAL HIGH (ref 0.0–149.0)
VLDL: 30.6 mg/dL (ref 0.0–40.0)

## 2023-07-16 LAB — TSH: TSH: 3.41 u[IU]/mL (ref 0.35–5.50)

## 2023-07-16 LAB — MICROALBUMIN / CREATININE URINE RATIO
Creatinine,U: 116.4 mg/dL
Microalb Creat Ratio: 22.1 mg/g (ref 0.0–30.0)
Microalb, Ur: 2.6 mg/dL — ABNORMAL HIGH (ref 0.0–1.9)

## 2023-07-16 MED ORDER — METOPROLOL TARTRATE 25 MG PO TABS: ORAL_TABLET | ORAL | 0 refills | Status: DC
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
## 2023-07-17 ENCOUNTER — Ambulatory Visit: Payer: Self-pay | Admitting: Family Medicine

## 2023-07-23 ENCOUNTER — Ambulatory Visit (INDEPENDENT_AMBULATORY_CARE_PROVIDER_SITE_OTHER): Admitting: Family Medicine

## 2023-07-23 ENCOUNTER — Encounter: Payer: Self-pay | Admitting: Family Medicine

## 2023-07-23 VITALS — BP 124/65 | HR 64 | Temp 97.5°F | Ht 60.0 in | Wt 176.0 lb

## 2023-07-23 DIAGNOSIS — I10 Essential (primary) hypertension: Secondary | ICD-10-CM

## 2023-07-23 MED ORDER — METOPROLOL TARTRATE 25 MG PO TABS: ORAL_TABLET | ORAL | 3 refills | Status: AC
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
## 2023-08-12 DIAGNOSIS — Z85820 Personal history of malignant melanoma of skin: Secondary | ICD-10-CM | POA: Diagnosis not present

## 2023-08-12 DIAGNOSIS — Z859 Personal history of malignant neoplasm, unspecified: Secondary | ICD-10-CM | POA: Diagnosis not present

## 2023-08-12 DIAGNOSIS — L821 Other seborrheic keratosis: Secondary | ICD-10-CM | POA: Diagnosis not present

## 2023-08-12 DIAGNOSIS — L820 Inflamed seborrheic keratosis: Secondary | ICD-10-CM | POA: Diagnosis not present

## 2023-08-12 DIAGNOSIS — Z129 Encounter for screening for malignant neoplasm, site unspecified: Secondary | ICD-10-CM | POA: Diagnosis not present

## 2023-08-12 DIAGNOSIS — Z86008 Personal history of in-situ neoplasm of other site: Secondary | ICD-10-CM | POA: Diagnosis not present

## 2023-08-13 ENCOUNTER — Other Ambulatory Visit: Payer: Self-pay | Admitting: Family Medicine

## 2023-08-13 MED ORDER — VITAMIN B-12 1000 MCG PO TABS: 1000.0000 ug | ORAL_TABLET | Freq: Every day | ORAL | 0 refills | Status: AC

## 2023-08-21 DIAGNOSIS — M2011 Hallux valgus (acquired), right foot: Secondary | ICD-10-CM | POA: Diagnosis not present

## 2023-08-21 DIAGNOSIS — L851 Acquired keratosis [keratoderma] palmaris et plantaris: Secondary | ICD-10-CM | POA: Diagnosis not present

## 2023-08-21 DIAGNOSIS — M7741 Metatarsalgia, right foot: Secondary | ICD-10-CM | POA: Diagnosis not present

## 2023-08-21 DIAGNOSIS — M21621 Bunionette of right foot: Secondary | ICD-10-CM | POA: Diagnosis not present

## 2023-09-17 ENCOUNTER — Other Ambulatory Visit: Payer: Self-pay | Admitting: Family Medicine

## 2023-10-20 ENCOUNTER — Other Ambulatory Visit: Payer: Self-pay | Admitting: Family Medicine

## 2023-11-06 ENCOUNTER — Ambulatory Visit: Admitting: Family Medicine

## 2023-11-06 ENCOUNTER — Ambulatory Visit: Payer: Self-pay | Admitting: Family Medicine

## 2023-11-06 ENCOUNTER — Encounter: Payer: Self-pay | Admitting: Family Medicine

## 2023-11-06 VITALS — BP 130/78 | HR 76 | Temp 97.6°F | Ht 60.0 in | Wt 174.6 lb

## 2023-11-06 DIAGNOSIS — E7800 Pure hypercholesterolemia, unspecified: Secondary | ICD-10-CM

## 2023-11-06 DIAGNOSIS — E119 Type 2 diabetes mellitus without complications: Secondary | ICD-10-CM | POA: Diagnosis not present

## 2023-11-06 DIAGNOSIS — I10 Essential (primary) hypertension: Secondary | ICD-10-CM

## 2023-11-06 DIAGNOSIS — Z23 Encounter for immunization: Secondary | ICD-10-CM | POA: Diagnosis not present

## 2023-11-06 LAB — POCT GLYCOSYLATED HEMOGLOBIN (HGB A1C)
HbA1c POC (<> result, manual entry): 6.5 % (ref 4.0–5.6)
HbA1c, POC (controlled diabetic range): 6.5 % (ref 0.0–7.0)
HbA1c, POC (prediabetic range): 6.5 % — AB (ref 5.7–6.4)
Hemoglobin A1C: 6.5 % — AB (ref 4.0–5.6)

## 2023-11-06 LAB — COMPREHENSIVE METABOLIC PANEL WITH GFR
ALT: 13 U/L (ref 0–35)
AST: 17 U/L (ref 0–37)
Albumin: 4.7 g/dL (ref 3.5–5.2)
Alkaline Phosphatase: 20 U/L — ABNORMAL LOW (ref 39–117)
BUN: 31 mg/dL — ABNORMAL HIGH (ref 6–23)
CO2: 29 meq/L (ref 19–32)
Calcium: 9.8 mg/dL (ref 8.4–10.5)
Chloride: 99 meq/L (ref 96–112)
Creatinine, Ser: 1.3 mg/dL — ABNORMAL HIGH (ref 0.40–1.20)
GFR: 36.06 mL/min — ABNORMAL LOW (ref >60.00)
Glucose, Bld: 124 mg/dL — ABNORMAL HIGH (ref 70–99)
Potassium: 3.9 meq/L (ref 3.5–5.1)
Sodium: 141 meq/L (ref 135–145)
Total Bilirubin: 0.5 mg/dL (ref 0.2–1.2)
Total Protein: 7.2 g/dL (ref 6.0–8.3)
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
## 2023-11-07 NOTE — Progress Notes (Signed)
 Results given to pt

## 2023-11-11 ENCOUNTER — Other Ambulatory Visit: Payer: Self-pay | Admitting: Family Medicine

## 2024-01-27 ENCOUNTER — Telehealth: Payer: Self-pay

## 2024-01-27 DIAGNOSIS — L821 Other seborrheic keratosis: Secondary | ICD-10-CM

## 2024-01-27 NOTE — Telephone Encounter (Signed)
 Pls do derm referral as per pt request.

## 2024-01-27 NOTE — Telephone Encounter (Signed)
 Copied from CRM [id number]. Topic: Referral - Question >> Jan 27, 2024 11:17 AM Victoria A wrote: Reason for CRM: Patient is asking if she needs referral with Dermatologist even though she is established with Dermatologist and has been going there for years-please contact  [phone]  [employer] is requiring referrals even if pt is already established with specialty provider

## 2024-01-28 NOTE — Telephone Encounter (Signed)
 LM for pt to return call regarding referral

## 2024-01-28 NOTE — Telephone Encounter (Signed)
 Referral completed to current provider. LVM for pt to CB to provide referral update

## 2024-01-28 NOTE — Telephone Encounter (Signed)
 Spoke with pt, she was informed referral placed for current provider. No further questions

## 2024-02-04 ENCOUNTER — Telehealth: Payer: Self-pay

## 2024-02-04 NOTE — Telephone Encounter (Signed)
 Spoke with pt to advise we placed a referral for this provider on 1/14, she was notified. Referral coordinator has already confirmed referral sent to their office. Pt voiced understanding.

## 2024-02-04 NOTE — Telephone Encounter (Signed)
 Copied from CRM #[id number]. Topic: Referral - Request for Referral >> Feb 04, 2024  9:48 AM Roselie BROCKS wrote: Did the patient discuss referral with their provider in the last year? Yes (If No - schedule appointment) (If Yes - send message)  Appointment offered? Yes  Type of order/referral and detailed reason for visit: [employer] is requiring referrals for the specialits   Preference of office, provider, location: [facility] [address] [place]  If referral order, have you been seen by this specialty before? Yes (If Yes, this issue or another issue? When? Where?  Can we respond through MyChart? No

## 2024-02-09 ENCOUNTER — Ambulatory Visit: Admitting: Family Medicine

## 2024-02-12 ENCOUNTER — Other Ambulatory Visit: Payer: Self-pay | Admitting: Family Medicine

## 2024-02-18 ENCOUNTER — Ambulatory Visit: Admitting: Family Medicine

## 2024-02-18 ENCOUNTER — Encounter: Payer: Self-pay | Admitting: Family Medicine

## 2024-02-18 VITALS — BP 136/72 | HR 71 | Temp 97.2°F | Ht 60.0 in | Wt 177.6 lb

## 2024-02-18 DIAGNOSIS — N2889 Other specified disorders of kidney and ureter: Secondary | ICD-10-CM

## 2024-02-18 DIAGNOSIS — E119 Type 2 diabetes mellitus without complications: Secondary | ICD-10-CM

## 2024-02-18 DIAGNOSIS — I10 Essential (primary) hypertension: Secondary | ICD-10-CM

## 2024-02-18 DIAGNOSIS — E7800 Pure hypercholesterolemia, unspecified: Secondary | ICD-10-CM

## 2024-02-18 LAB — LIPID PANEL
Cholesterol: 157 mg/dL (ref 28–200)
HDL: 35.9 mg/dL — ABNORMAL LOW
LDL Cholesterol: 87 mg/dL (ref 10–99)
NonHDL: 120.87
Total CHOL/HDL Ratio: 4
Triglycerides: 168 mg/dL — ABNORMAL HIGH (ref 10.0–149.0)
VLDL: 33.6 mg/dL (ref 0.0–40.0)

## 2024-02-18 LAB — BASIC METABOLIC PANEL WITH GFR
BUN: 20 mg/dL (ref 6–23)
CO2: 27 meq/L (ref 19–32)
Calcium: 9.3 mg/dL (ref 8.4–10.5)
Chloride: 103 meq/L (ref 96–112)
Creatinine, Ser: 1.19 mg/dL (ref 0.40–1.20)
GFR: 40.02 mL/min — ABNORMAL LOW
Glucose, Bld: 130 mg/dL — ABNORMAL HIGH (ref 70–99)
Potassium: 4 meq/L (ref 3.5–5.1)
Sodium: 142 meq/L (ref 135–145)

## 2024-02-18 LAB — HEMOGLOBIN A1C: Hgb A1c MFr Bld: 7 % — ABNORMAL HIGH (ref 4.6–6.5)
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
## 2024-05-19 ENCOUNTER — Ambulatory Visit: Admitting: Family Medicine
